# Patient Record
Sex: Female | Born: 1980 | Race: Black or African American | Hispanic: No | Smoking: Never smoker
Health system: Southern US, Community
[De-identification: ages and names within clinical notes are randomized; demographics above are authoritative.]

## PROBLEM LIST (undated history)

## (undated) DIAGNOSIS — Z8042 Family history of malignant neoplasm of prostate: Secondary | ICD-10-CM

## (undated) DIAGNOSIS — M1990 Unspecified osteoarthritis, unspecified site: Secondary | ICD-10-CM

## (undated) DIAGNOSIS — Z800 Family history of malignant neoplasm of digestive organs: Secondary | ICD-10-CM

## (undated) DIAGNOSIS — R002 Palpitations: Secondary | ICD-10-CM

## (undated) DIAGNOSIS — K219 Gastro-esophageal reflux disease without esophagitis: Secondary | ICD-10-CM

## (undated) DIAGNOSIS — M2550 Pain in unspecified joint: Secondary | ICD-10-CM

## (undated) DIAGNOSIS — K5900 Constipation, unspecified: Secondary | ICD-10-CM

## (undated) DIAGNOSIS — N946 Dysmenorrhea, unspecified: Secondary | ICD-10-CM

## (undated) DIAGNOSIS — I10 Essential (primary) hypertension: Secondary | ICD-10-CM

## (undated) DIAGNOSIS — D649 Anemia, unspecified: Secondary | ICD-10-CM

## (undated) DIAGNOSIS — Z91018 Allergy to other foods: Secondary | ICD-10-CM

## (undated) DIAGNOSIS — M549 Dorsalgia, unspecified: Secondary | ICD-10-CM

## (undated) DIAGNOSIS — Z803 Family history of malignant neoplasm of breast: Secondary | ICD-10-CM

## (undated) DIAGNOSIS — R079 Chest pain, unspecified: Secondary | ICD-10-CM

## (undated) DIAGNOSIS — G43009 Migraine without aura, not intractable, without status migrainosus: Secondary | ICD-10-CM

## (undated) HISTORY — DX: Family history of malignant neoplasm of breast: Z80.3

## (undated) HISTORY — DX: Migraine without aura, not intractable, without status migrainosus: G43.009

## (undated) HISTORY — DX: Unspecified osteoarthritis, unspecified site: M19.90

## (undated) HISTORY — DX: Chest pain, unspecified: R07.9

## (undated) HISTORY — DX: Dysmenorrhea, unspecified: N94.6

## (undated) HISTORY — DX: Dorsalgia, unspecified: M54.9

## (undated) HISTORY — DX: Palpitations: R00.2

## (undated) HISTORY — DX: Anemia, unspecified: D64.9

## (undated) HISTORY — DX: Gastro-esophageal reflux disease without esophagitis: K21.9

## (undated) HISTORY — DX: Family history of malignant neoplasm of digestive organs: Z80.0

## (undated) HISTORY — DX: Family history of malignant neoplasm of prostate: Z80.42

## (undated) HISTORY — DX: Pain in unspecified joint: M25.50

## (undated) HISTORY — DX: Constipation, unspecified: K59.00

## (undated) HISTORY — DX: Allergy to other foods: Z91.018

## (undated) HISTORY — DX: Essential (primary) hypertension: I10

---

## 2006-12-06 ENCOUNTER — Emergency Department (HOSPITAL_COMMUNITY): Admission: EM | Admit: 2006-12-06 | Discharge: 2006-12-06 | Payer: Self-pay | Admitting: Emergency Medicine

## 2007-11-07 ENCOUNTER — Emergency Department (HOSPITAL_COMMUNITY): Admission: EM | Admit: 2007-11-07 | Discharge: 2007-11-07 | Payer: Self-pay | Admitting: Emergency Medicine

## 2009-06-27 ENCOUNTER — Emergency Department (HOSPITAL_BASED_OUTPATIENT_CLINIC_OR_DEPARTMENT_OTHER): Admission: EM | Admit: 2009-06-27 | Discharge: 2009-06-27 | Payer: Self-pay | Admitting: Emergency Medicine

## 2009-07-19 ENCOUNTER — Emergency Department (HOSPITAL_BASED_OUTPATIENT_CLINIC_OR_DEPARTMENT_OTHER): Admission: EM | Admit: 2009-07-19 | Discharge: 2009-07-19 | Payer: Self-pay | Admitting: Emergency Medicine

## 2009-07-19 ENCOUNTER — Ambulatory Visit: Payer: Self-pay | Admitting: Diagnostic Radiology

## 2010-08-29 ENCOUNTER — Emergency Department (HOSPITAL_BASED_OUTPATIENT_CLINIC_OR_DEPARTMENT_OTHER): Admission: EM | Admit: 2010-08-29 | Discharge: 2010-08-29 | Payer: Self-pay | Admitting: Emergency Medicine

## 2011-01-28 LAB — BASIC METABOLIC PANEL
BUN: 9 mg/dL (ref 6–23)
CO2: 26 mEq/L (ref 19–32)
Calcium: 9.6 mg/dL (ref 8.4–10.5)
Chloride: 108 mEq/L (ref 96–112)
Creatinine, Ser: 0.7 mg/dL (ref 0.4–1.2)
GFR calc Af Amer: >60 mL/min (ref >60)
GFR calc non Af Amer: >60 mL/min (ref >60)
Glucose, Bld: 85 mg/dL (ref 70–99)
Potassium: 4 mEq/L (ref 3.5–5.1)
Sodium: 141 mEq/L (ref 135–145)

## 2011-01-28 LAB — POCT CARDIAC MARKERS
CKMB, poc: <1 ng/mL — ABNORMAL LOW (ref 1.0–8.0)
Myoglobin, poc: 50.5 ng/mL (ref 12–200)
Troponin i, poc: <0.05 ng/mL (ref 0.00–0.09)

## 2011-01-28 LAB — CBC
HCT: 35.3 % — ABNORMAL LOW (ref 36.0–46.0)
Hemoglobin: 12.5 g/dL (ref 12.0–15.0)
MCHC: 35.6 g/dL (ref 30.0–36.0)
MCV: 90.2 fL (ref 78.0–100.0)
Platelets: 261 10*3/uL (ref 150–400)
RBC: 3.91 MIL/uL (ref 3.87–5.11)
RDW: 12.4 % (ref 11.5–15.5)
WBC: 4.5 10*3/uL (ref 4.0–10.5)

## 2011-01-28 LAB — DIFFERENTIAL
Basophils Absolute: 0.1 10*3/uL (ref 0.0–0.1)
Basophils Relative: 1 % (ref 0–1)
Eosinophils Absolute: 0.1 10*3/uL (ref 0.0–0.7)
Eosinophils Relative: 2 % (ref 0–5)
Lymphocytes Relative: 36 % (ref 12–46)
Lymphs Abs: 1.6 10*3/uL (ref 0.7–4.0)
Monocytes Absolute: 0.3 10*3/uL (ref 0.1–1.0)
Monocytes Relative: 7 % (ref 3–12)
Neutro Abs: 2.4 10*3/uL (ref 1.7–7.7)
Neutrophils Relative %: 53 % (ref 43–77)

## 2011-01-28 LAB — CK: Total CK: 99 U/L (ref 7–177)

## 2011-01-28 LAB — RAPID STREP SCREEN (MED CTR MEBANE ONLY): Streptococcus, Group A Screen (Direct): NEGATIVE

## 2011-07-14 LAB — CBC
HCT: 34.4 — ABNORMAL LOW
Hemoglobin: 11.9 — ABNORMAL LOW
MCHC: 34.6
MCV: 87.9
Platelets: 242
RBC: 3.91
RDW: 12.7
WBC: 8.8

## 2011-07-14 LAB — DIFFERENTIAL
Basophils Absolute: 0
Basophils Relative: 0
Eosinophils Absolute: 0
Eosinophils Relative: 0
Lymphocytes Relative: 12
Lymphs Abs: 1
Monocytes Absolute: 0.1
Monocytes Relative: 1 — ABNORMAL LOW
Neutro Abs: 7.6
Neutrophils Relative %: 87 — ABNORMAL HIGH

## 2011-07-14 LAB — COMPREHENSIVE METABOLIC PANEL
ALT: 11
AST: 24
Albumin: 3.9
Alkaline Phosphatase: 56
BUN: 9
CO2: 27
Calcium: 9.3
Chloride: 106
Creatinine, Ser: 1.18
GFR calc Af Amer: >60
GFR calc non Af Amer: 56 — ABNORMAL LOW
Glucose, Bld: 105 — ABNORMAL HIGH
Potassium: 4
Sodium: 136
Total Bilirubin: 0.9
Total Protein: 6.8

## 2011-07-14 LAB — URINE MICROSCOPIC-ADD ON

## 2011-07-14 LAB — URINALYSIS, ROUTINE W REFLEX MICROSCOPIC
Bilirubin Urine: NEGATIVE
Glucose, UA: NEGATIVE
Ketones, ur: NEGATIVE
Leukocytes, UA: NEGATIVE
Nitrite: NEGATIVE
Protein, ur: 30 — AB
Specific Gravity, Urine: 1.034 — ABNORMAL HIGH
Urobilinogen, UA: 1
pH: 6

## 2011-07-14 LAB — POCT PREGNANCY, URINE
Operator id: 29727
Preg Test, Ur: POSITIVE

## 2011-07-14 LAB — LIPASE, BLOOD: Lipase: 17

## 2013-02-20 ENCOUNTER — Ambulatory Visit: Payer: Self-pay | Admitting: Family

## 2013-02-27 ENCOUNTER — Ambulatory Visit: Payer: Self-pay | Admitting: Family

## 2013-03-08 ENCOUNTER — Encounter: Payer: Self-pay | Admitting: Family

## 2013-03-08 ENCOUNTER — Other Ambulatory Visit (HOSPITAL_COMMUNITY)
Admission: RE | Admit: 2013-03-08 | Discharge: 2013-03-08 | Disposition: A | Discharge status: Home / Self Care | Payer: BC Managed Care – PPO | Source: Ambulatory Visit | Attending: Family | Admitting: Family

## 2013-03-08 ENCOUNTER — Ambulatory Visit (INDEPENDENT_AMBULATORY_CARE_PROVIDER_SITE_OTHER): Payer: BC Managed Care – PPO | Admitting: Family

## 2013-03-08 VITALS — BP 108/80 | HR 91 | Ht 66.5 in | Wt 166.0 lb

## 2013-03-08 DIAGNOSIS — Z01419 Encounter for gynecological examination (general) (routine) without abnormal findings: Secondary | ICD-10-CM | POA: Insufficient documentation

## 2013-03-08 DIAGNOSIS — Z124 Encounter for screening for malignant neoplasm of cervix: Secondary | ICD-10-CM

## 2013-03-08 DIAGNOSIS — Z0000 Encounter for general adult medical examination without abnormal findings: Secondary | ICD-10-CM

## 2013-03-08 DIAGNOSIS — E785 Hyperlipidemia, unspecified: Secondary | ICD-10-CM

## 2013-03-08 LAB — CBC WITH DIFFERENTIAL/PLATELET
Basophils Absolute: 0 10*3/uL (ref 0.0–0.1)
Basophils Relative: 1 % (ref 0–1)
Eosinophils Absolute: 0.1 10*3/uL (ref 0.0–0.7)
Eosinophils Relative: 2 % (ref 0–5)
HCT: 35.5 % — ABNORMAL LOW (ref 36.0–46.0)
Hemoglobin: 11.7 g/dL — ABNORMAL LOW (ref 12.0–15.0)
Lymphocytes Relative: 45 % (ref 12–46)
Lymphs Abs: 2.3 10*3/uL (ref 0.7–4.0)
MCH: 28.7 pg (ref 26.0–34.0)
MCHC: 33 g/dL (ref 30.0–36.0)
MCV: 87.2 fL (ref 78.0–100.0)
Monocytes Absolute: 0.6 10*3/uL (ref 0.1–1.0)
Monocytes Relative: 12 % (ref 3–12)
Neutro Abs: 2 10*3/uL (ref 1.7–7.7)
Neutrophils Relative %: 40 % — ABNORMAL LOW (ref 43–77)
Platelets: 329 10*3/uL (ref 150–400)
RBC: 4.07 MIL/uL (ref 3.87–5.11)
RDW: 13.7 % (ref 11.5–15.5)
WBC: 5 10*3/uL (ref 4.0–10.5)

## 2013-03-08 LAB — TSH: TSH: 0.843 u[IU]/mL (ref 0.350–4.500)

## 2013-03-08 NOTE — Patient Instructions (Signed)
Breast Self-Awareness  Practicing breast self-awareness may pick up problems early, prevent significant medical complications, and possibly save your life. By practicing breast self-awareness, you can become familiar with how your breasts look and feel and if your breasts are changing. This allows you to notice changes early. It can also offer you some reassurance that your breast health is good. One way to learn what is normal for your breasts and whether your breasts are changing is to do a breast self-exam.  If you find a lump or something that was not present in the past, it is best to contact your caregiver right away. Other findings that should be evaluated by your caregiver include nipple discharge, especially if it is bloody; skin changes or reddening; areas where the skin seems to be pulled in (retracted); or new lumps and bumps. Breast pain is seldom associated with cancer (malignancy), but should also be evaluated by a caregiver.  BREAST SELF-EXAM  The best time to examine your breasts is 5 7 days after your menstrual period is over. During menstruation, the breasts are lumpier, and it may be more difficult to pick up changes. If you do not menstruate, have reached menopause, or had your uterus removed (hysterectomy), you should examine your breasts at regular intervals, such as monthly. If you are breastfeeding, examine your breasts after a feeding or after using a breast pump. Breast implants do not decrease the risk for lumps or tumors, so continue to perform breast self-exams as recommended. Talk to your caregiver about how to determine the difference between the implant and breast tissue. Also, talk about the amount of pressure you should use during the exam. Over time, you will become more familiar with the variations of your breasts and more comfortable with the exam. A breast self-exam requires you to remove all your clothes above the waist.    Look at your breasts and nipples. Stand in front of  a mirror in a room with good lighting. With your hands on your hips, push your hands firmly downward. Look for a difference in shape, contour, and size from one breast to the other (asymmetry). Asymmetry includes puckers, dips, or bumps. Also, look for skin changes, such as reddened or scaly areas on the breasts. Look for nipple changes, such as discharge, dimpling, repositioning, or redness.   Carefully feel your breasts. This is best done either in the shower or tub while using soapy water or when flat on your back. Place the arm (on the side of the breast you are examining) above your head. Use the pads (not the fingertips) of your three middle fingers on your opposite hand to feel your breasts. Start in the underarm area and use  inch (2 cm) overlapping circles to feel your breast. Use 3 different levels of pressure (light, medium, and firm pressure) at each circle before moving to the next circle. The light pressure is needed to feel the tissue closest to the skin. The medium pressure will help to feel breast tissue a little deeper, while the firm pressure is needed to feel the tissue close to the ribs. Continue the overlapping circles, moving downward over the breast until you feel your ribs below your breast. Then, move one finger-width towards the center of the body. Continue to use the  inch (2 cm) overlapping circles to feel your breast as you move slowly up toward the collar bone (clavicle) near the base of the neck. Continue the up and down exam using all 3 pressures   until you reach the middle of the chest. Do this with each breast, carefully feeling for lumps or changes.   Keep a written record with breast changes or normal findings for each breast. By writing this information down, you do not need to depend only on memory for size, tenderness, or location. Write down where you are in your menstrual cycle, if you are still menstruating.   Breast tissue can have some lumps or thick tissue. However,  see your caregiver if you find anything that concerns you.   SEEK MEDICAL CARE IF:   You see a change in shape, contour, or size of your breasts or nipples.    You see skin changes, such as reddened or scaly areas on the breasts or nipples.    You have an unusual discharge from your nipples.    You feel a new lump or unusually thick areas.   Document Released: 10/10/2005 Document Revised: 04/10/2012 Document Reviewed: 01/25/2012  [employer] Patient Information 2013 [employer].

## 2013-03-08 NOTE — Progress Notes (Signed)
  Subjective:    Patient ID: Audrey Stafford, female    DOB: Apr 23, 1981, 32 y.o.   MRN: [id number]  HPI 32 year old new patient to the practice is in to be established and for complete physical exam with Pap smear. Denies any complaints today.   Review of Systems  Constitutional: Negative.   HENT: Negative.   Eyes: Negative.   Respiratory: Negative.   Cardiovascular: Negative.   Gastrointestinal: Negative.   Endocrine: Negative.   Genitourinary: Negative.   Musculoskeletal: Negative.   Skin: Negative.   Allergic/Immunologic: Negative.   Neurological: Negative.   Hematological: Negative.   Psychiatric/Behavioral: Negative.    Past Medical History  Diagnosis Date  . Migraine   . GERD (gastroesophageal reflux disease)     History   Social History  . Marital Status: Single    Spouse Name: N/A    Number of Children: N/A  . Years of Education: N/A   Occupational History  . Not on file.   Social History Main Topics  . Smoking status: Never Smoker   . Smokeless tobacco: Not on file  . Alcohol Use: Yes     Comment: socially  . Drug Use: No  . Sexually Active: Not on file   Other Topics Concern  . Not on file   Social History Narrative  . No narrative on file    Past Surgical History  Procedure Laterality Date  . Cesarean section  01/11/2005    No family history on file.  No Known Allergies  No current outpatient prescriptions on file prior to visit.   No current facility-administered medications on file prior to visit.    BP 108/80  Pulse 91  Ht 5' 6.5" (1.689 m)  Wt 166 lb (75.297 kg)  BMI 26.39 kg/m2  SpO2 98%  LMP 05/15/2014chart    Objective:   Physical Exam  Constitutional: She is oriented to person, place, and time. She appears well-developed and well-nourished.  HENT:  Right Ear: External ear normal.  Left Ear: External ear normal.  Nose: Nose normal.  Mouth/Throat: Oropharynx is clear and moist.  Neck: Normal range of motion. Neck  supple.  Cardiovascular: Normal rate, regular rhythm and normal heart sounds.   Pulmonary/Chest: Effort normal and breath sounds normal.  Abdominal: Soft. Bowel sounds are normal.  Musculoskeletal: Normal range of motion.  Neurological: She is alert and oriented to person, place, and time.  Skin: Skin is warm and dry.  Psychiatric: She has a normal mood and affect.          Assessment & Plan:  Assessment:  1. Complete physical exam  Plan: Pap smear sent. Lab sent to include BMP, CBC, lipids, TSH, LFTs will notify patient pending results. Encouraged healthy diet, exercise, monthly self breast exams, safe sex practices. We'll follow with patient and the results of her labs, in one year, and sooner as needed.

## 2013-03-09 LAB — LIPID PANEL
Cholesterol: 202 mg/dL — ABNORMAL HIGH (ref 0–200)
HDL: 36 mg/dL — ABNORMAL LOW (ref >39)
LDL Cholesterol: 154 mg/dL — ABNORMAL HIGH (ref 0–99)
Total CHOL/HDL Ratio: 5.6 Ratio
Triglycerides: 62 mg/dL (ref <150)
VLDL: 12 mg/dL (ref 0–40)

## 2013-03-09 LAB — COMPREHENSIVE METABOLIC PANEL
ALT: <8 U/L (ref 0–35)
AST: 13 U/L (ref 0–37)
Albumin: 4.2 g/dL (ref 3.5–5.2)
Alkaline Phosphatase: 57 U/L (ref 39–117)
BUN: 10 mg/dL (ref 6–23)
CO2: 27 mEq/L (ref 19–32)
Calcium: 9.7 mg/dL (ref 8.4–10.5)
Chloride: 106 mEq/L (ref 96–112)
Creat: 0.65 mg/dL (ref 0.50–1.10)
Glucose, Bld: 77 mg/dL (ref 70–99)
Potassium: 4.2 mEq/L (ref 3.5–5.3)
Sodium: 138 mEq/L (ref 135–145)
Total Bilirubin: 0.3 mg/dL (ref 0.3–1.2)
Total Protein: 7 g/dL (ref 6.0–8.3)

## 2013-03-11 ENCOUNTER — Encounter: Payer: Self-pay | Admitting: Family

## 2013-03-11 DIAGNOSIS — E785 Hyperlipidemia, unspecified: Secondary | ICD-10-CM | POA: Insufficient documentation

## 2013-04-08 ENCOUNTER — Ambulatory Visit: Payer: BC Managed Care – PPO

## 2013-07-25 ENCOUNTER — Encounter: Payer: Self-pay | Admitting: Family

## 2013-07-26 ENCOUNTER — Other Ambulatory Visit: Payer: Self-pay

## 2013-07-26 MED ORDER — NORGESTIMATE-ETH ESTRADIOL 0.25-35 MG-MCG PO TABS: 1.0000 | ORAL_TABLET | Freq: Every day | ORAL | Status: DC

## 2013-08-29 ENCOUNTER — Other Ambulatory Visit: Payer: Self-pay

## 2014-01-06 ENCOUNTER — Telehealth: Payer: Self-pay | Admitting: Family

## 2014-01-06 NOTE — Telephone Encounter (Signed)
[facility] - [place], [place] - [address] requesting re-fill on norgestimate-ethinyl estradiol (ORTHO-CYCLEN,SPRINTEC,PREVIFEM) 0.25-35 MG-MCG tablet

## 2014-06-27 ENCOUNTER — Telehealth: Payer: Self-pay | Admitting: Family

## 2014-06-27 MED ORDER — NORGESTIMATE-ETH ESTRADIOL 0.25-35 MG-MCG PO TABS: 1.0000 | ORAL_TABLET | Freq: Every day | ORAL | Status: DC

## 2014-06-27 NOTE — Telephone Encounter (Signed)
Pt request refill norgestimate-ethinyl estradiol (ORTHO-CYCLEN,SPRINTEC,PREVIFEM) 0.25-35 MG-MCG tablet [facility] Pt has made cpe for 10/8 but needs 30 days rx to fet her through to appt

## 2014-06-27 NOTE — Telephone Encounter (Signed)
Refilled for one year on 07/26/13.  Called and spoke to [facility] [address] and they had no refills on file.  Will send in a refill by e-scribe to the pharmacy.

## 2014-07-25 ENCOUNTER — Telehealth: Payer: Self-pay | Admitting: Family

## 2014-07-25 MED ORDER — NORGESTIMATE-ETH ESTRADIOL 0.25-35 MG-MCG PO TABS: 1.0000 | ORAL_TABLET | Freq: Every day | ORAL | Status: DC

## 2014-07-25 NOTE — Telephone Encounter (Signed)
Pt has to resc due to pcp sch, and will need a refill to get her through to her new appt time. norgestimate-ethinyl estradiol (ORTHO-CYCLEN,SPRINTEC,PREVIFEM) 0.25-35 MG-MCG tablet [facility] [address]

## 2014-07-31 ENCOUNTER — Encounter: Payer: BC Managed Care – PPO | Admitting: Family

## 2014-08-18 ENCOUNTER — Ambulatory Visit (INDEPENDENT_AMBULATORY_CARE_PROVIDER_SITE_OTHER)
Admission: RE | Admit: 2014-08-18 | Discharge: 2014-08-18 | Disposition: A | Discharge status: Home / Self Care | Payer: 59 | Source: Ambulatory Visit | Attending: Family | Admitting: Family

## 2014-08-18 ENCOUNTER — Encounter: Payer: Self-pay | Admitting: Family

## 2014-08-18 ENCOUNTER — Ambulatory Visit (INDEPENDENT_AMBULATORY_CARE_PROVIDER_SITE_OTHER): Payer: 59 | Admitting: Family

## 2014-08-18 VITALS — BP 120/80 | HR 97 | Ht 66.5 in | Wt 182.4 lb

## 2014-08-18 DIAGNOSIS — Z0000 Encounter for general adult medical examination without abnormal findings: Secondary | ICD-10-CM

## 2014-08-18 DIAGNOSIS — M25561 Pain in right knee: Secondary | ICD-10-CM

## 2014-08-18 LAB — COMPREHENSIVE METABOLIC PANEL
ALT: 10 U/L (ref 0–35)
AST: 18 U/L (ref 0–37)
Albumin: 3.5 g/dL (ref 3.5–5.2)
Alkaline Phosphatase: 60 U/L (ref 39–117)
BUN: 8 mg/dL (ref 6–23)
CO2: 25 mEq/L (ref 19–32)
Calcium: 9.3 mg/dL (ref 8.4–10.5)
Chloride: 105 mEq/L (ref 96–112)
Creatinine, Ser: 0.7 mg/dL (ref 0.4–1.2)
GFR: 129.08 mL/min (ref >60.00)
Glucose, Bld: 78 mg/dL (ref 70–99)
Potassium: 4.1 mEq/L (ref 3.5–5.1)
Sodium: 138 mEq/L (ref 135–145)
Total Bilirubin: 0.4 mg/dL (ref 0.2–1.2)
Total Protein: 7.8 g/dL (ref 6.0–8.3)

## 2014-08-18 LAB — CBC WITH DIFFERENTIAL/PLATELET
Basophils Absolute: 0 10*3/uL (ref 0.0–0.1)
Basophils Relative: 0.4 % (ref 0.0–3.0)
Eosinophils Absolute: 0.1 10*3/uL (ref 0.0–0.7)
Eosinophils Relative: 1.3 % (ref 0.0–5.0)
HCT: 35.5 % — ABNORMAL LOW (ref 36.0–46.0)
Hemoglobin: 11.9 g/dL — ABNORMAL LOW (ref 12.0–15.0)
Lymphocytes Relative: 32.6 % (ref 12.0–46.0)
Lymphs Abs: 1.8 10*3/uL (ref 0.7–4.0)
MCHC: 33.6 g/dL (ref 30.0–36.0)
MCV: 88.2 fl (ref 78.0–100.0)
Monocytes Absolute: 0.6 10*3/uL (ref 0.1–1.0)
Monocytes Relative: 9.9 % (ref 3.0–12.0)
Neutro Abs: 3.2 10*3/uL (ref 1.4–7.7)
Neutrophils Relative %: 55.8 % (ref 43.0–77.0)
Platelets: 314 10*3/uL (ref 150.0–400.0)
RBC: 4.03 Mil/uL (ref 3.87–5.11)
RDW: 13.6 % (ref 11.5–15.5)
WBC: 5.7 10*3/uL (ref 4.0–10.5)

## 2014-08-18 LAB — POCT URINALYSIS DIPSTICK
Bilirubin, UA: NEGATIVE
Blood, UA: NEGATIVE
Glucose, UA: NEGATIVE
Ketones, UA: NEGATIVE
Leukocytes, UA: NEGATIVE
Nitrite, UA: NEGATIVE
Protein, UA: NEGATIVE
Spec Grav, UA: <=1.005
Urobilinogen, UA: 0.2
pH, UA: 6

## 2014-08-18 LAB — LIPID PANEL
Cholesterol: 175 mg/dL (ref 0–200)
HDL: 34.1 mg/dL — ABNORMAL LOW (ref >39.00)
LDL Cholesterol: 127 mg/dL — ABNORMAL HIGH (ref 0–99)
NonHDL: 140.9
Total CHOL/HDL Ratio: 5
Triglycerides: 68 mg/dL (ref 0.0–149.0)
VLDL: 13.6 mg/dL (ref 0.0–40.0)

## 2014-08-18 LAB — TSH: TSH: 1.15 u[IU]/mL (ref 0.35–4.50)

## 2014-08-18 MED ORDER — NORGESTIMATE-ETH ESTRADIOL 0.25-35 MG-MCG PO TABS: 1.0000 | ORAL_TABLET | Freq: Every day | ORAL | Status: DC

## 2014-08-18 NOTE — Progress Notes (Signed)
Pre visit review using our clinic review tool, if applicable. No additional management support is needed unless otherwise documented below in the visit note. 

## 2014-08-18 NOTE — Patient Instructions (Signed)

## 2014-08-18 NOTE — Progress Notes (Signed)
   Subjective:    Patient ID: Audrey Stafford, female    DOB: Oct 17, 1981, 33 y.o.   MRN: [id number]  HPI 33 year old African-American female, nonsmoker is in today for complete physical exam. Denies any concerns. Had a Pap smear done last year that was norma Does not routinely exercise. Does perform monthly self breast examsl. Denies any concerns.   Review of Systems  Constitutional: Negative.   HENT: Negative.   Eyes: Negative.   Respiratory: Negative.   Gastrointestinal: Negative.   Endocrine: Negative.   Genitourinary: Negative.   Musculoskeletal: Negative.   Skin: Negative.   Allergic/Immunologic: Negative.   Neurological: Negative.   Hematological: Negative.   Psychiatric/Behavioral: Negative.    Past Medical History  Diagnosis Date  . Migraine   . GERD (gastroesophageal reflux disease)     History   Social History  . Marital Status: Single    Spouse Name: N/A    Number of Children: N/A  . Years of Education: N/A   Occupational History  . Not on file.   Social History Main Topics  . Smoking status: Never Smoker   . Smokeless tobacco: Not on file  . Alcohol Use: Yes     Comment: socially  . Drug Use: No  . Sexual Activity: Not on file   Other Topics Concern  . Not on file   Social History Narrative  . No narrative on file    Past Surgical History  Procedure Laterality Date  . Cesarean section  01/11/2005    Family History  Problem Relation Age of Onset  . Sudden death Mother     No Known Allergies  No current outpatient prescriptions on file prior to visit.   No current facility-administered medications on file prior to visit.    BP 120/80  Pulse 97  Ht 5' 6.5" (1.689 m)  Wt 182 lb 6.4 oz (82.736 kg)  BMI 29.00 kg/m2  LMP 09/28/2015chart    Objective:   Physical Exam  Constitutional: She is oriented to person, place, and time. She appears well-developed and well-nourished.  HENT:  Right Ear: External ear normal.  Left Ear:  External ear normal.  Nose: Nose normal.  Mouth/Throat: Oropharynx is clear and moist.  Eyes: Conjunctivae and EOM are normal. Pupils are equal, round, and reactive to light.  Neck: Normal range of motion. Neck supple. No thyromegaly present.  Cardiovascular: Normal rate, regular rhythm and normal heart sounds.   Pulmonary/Chest: Effort normal and breath sounds normal.  Abdominal: Soft. Bowel sounds are normal.  Musculoskeletal: Normal range of motion.  Neurological: She is alert and oriented to person, place, and time. She has normal reflexes. No cranial nerve deficit. Coordination normal.  Skin: Skin is warm.  Psychiatric: She has a normal mood and affect.          Assessment & Plan:  Marlaine HindChanika was seen today for annual exam.  Diagnoses and associated orders for this visit:  Preventative health care - CBC with Differential - CMP - Lipid Panel - POCT urinalysis dipstick - TSH  Right knee pain - DG Knee Complete 4 Views Right; Future  Other Orders - norgestimate-ethinyl estradiol (ORTHO-CYCLEN,SPRINTEC,PREVIFEM) 0.25-35 MG-MCG tablet; Take 1 tablet by mouth daily.     Encouraged healthy diet, exercise, monthly self breast exams, and weight reduction.

## 2014-08-25 ENCOUNTER — Encounter: Payer: Self-pay | Admitting: Family

## 2015-07-27 ENCOUNTER — Telehealth: Payer: Self-pay | Admitting: Family

## 2015-07-27 MED ORDER — NORGESTIMATE-ETH ESTRADIOL 0.25-35 MG-MCG PO TABS: 1.0000 | ORAL_TABLET | Freq: Every day | ORAL | Status: DC

## 2015-07-27 NOTE — Telephone Encounter (Signed)
Pt has an appt w/cory on 10/28 and needs 1 pk of ortho-cyclen call into [facility] [address]

## 2015-07-27 NOTE — Telephone Encounter (Signed)
Rx sent to pharmacy. Left a message for pt that rx was called in.

## 2015-08-21 ENCOUNTER — Other Ambulatory Visit (HOSPITAL_COMMUNITY)
Admission: RE | Admit: 2015-08-21 | Discharge: 2015-08-21 | Disposition: A | Discharge status: Home / Self Care | Payer: 59 | Source: Ambulatory Visit | Attending: Adult Health | Admitting: Adult Health

## 2015-08-21 ENCOUNTER — Encounter: Payer: Self-pay | Admitting: Adult Health

## 2015-08-21 ENCOUNTER — Ambulatory Visit (INDEPENDENT_AMBULATORY_CARE_PROVIDER_SITE_OTHER): Payer: 59 | Admitting: Adult Health

## 2015-08-21 VITALS — BP 120/84 | Temp 98.6°F | Ht 66.5 in | Wt 189.0 lb

## 2015-08-21 DIAGNOSIS — N760 Acute vaginitis: Secondary | ICD-10-CM | POA: Insufficient documentation

## 2015-08-21 DIAGNOSIS — E669 Obesity, unspecified: Secondary | ICD-10-CM | POA: Diagnosis not present

## 2015-08-21 DIAGNOSIS — Z113 Encounter for screening for infections with a predominantly sexual mode of transmission: Secondary | ICD-10-CM | POA: Insufficient documentation

## 2015-08-21 DIAGNOSIS — Z0000 Encounter for general adult medical examination without abnormal findings: Secondary | ICD-10-CM | POA: Diagnosis not present

## 2015-08-21 LAB — BASIC METABOLIC PANEL
BUN: 8 mg/dL (ref 6–23)
CO2: 28 mEq/L (ref 19–32)
Calcium: 9.9 mg/dL (ref 8.4–10.5)
Chloride: 105 mEq/L (ref 96–112)
Creatinine, Ser: 0.64 mg/dL (ref 0.40–1.20)
GFR: 137.57 mL/min (ref >60.00)
Glucose, Bld: 78 mg/dL (ref 70–99)
Potassium: 4.4 mEq/L (ref 3.5–5.1)
Sodium: 140 mEq/L (ref 135–145)

## 2015-08-21 LAB — HEPATIC FUNCTION PANEL
ALT: 10 U/L (ref 0–35)
AST: 16 U/L (ref 0–37)
Albumin: 4.2 g/dL (ref 3.5–5.2)
Alkaline Phosphatase: 67 U/L (ref 39–117)
Bilirubin, Direct: 0.1 mg/dL (ref 0.0–0.3)
Total Bilirubin: 0.4 mg/dL (ref 0.2–1.2)
Total Protein: 7.5 g/dL (ref 6.0–8.3)

## 2015-08-21 LAB — TSH: TSH: 0.57 u[IU]/mL (ref 0.35–4.50)

## 2015-08-21 LAB — POCT URINALYSIS DIPSTICK
Bilirubin, UA: NEGATIVE
Glucose, UA: NEGATIVE
Ketones, UA: NEGATIVE
Leukocytes, UA: NEGATIVE
Nitrite, UA: NEGATIVE
Protein, UA: NEGATIVE
Spec Grav, UA: >=1.03
Urobilinogen, UA: 0.2
pH, UA: 5.5

## 2015-08-21 LAB — LIPID PANEL
Cholesterol: 204 mg/dL — ABNORMAL HIGH (ref 0–200)
HDL: 40.4 mg/dL (ref >39.00)
LDL Cholesterol: 154 mg/dL — ABNORMAL HIGH (ref 0–99)
NonHDL: 163.47
Total CHOL/HDL Ratio: 5
Triglycerides: 49 mg/dL (ref 0.0–149.0)
VLDL: 9.8 mg/dL (ref 0.0–40.0)

## 2015-08-21 LAB — CBC
HCT: 36.5 % (ref 36.0–46.0)
Hemoglobin: 12.1 g/dL (ref 12.0–15.0)
MCHC: 33.1 g/dL (ref 30.0–36.0)
MCV: 88.7 fl (ref 78.0–100.0)
Platelets: 334 10*3/uL (ref 150.0–400.0)
RBC: 4.12 Mil/uL (ref 3.87–5.11)
RDW: 12.7 % (ref 11.5–15.5)
WBC: 5.2 10*3/uL (ref 4.0–10.5)

## 2015-08-21 LAB — HEMOGLOBIN A1C: Hgb A1c MFr Bld: 5.3 % (ref 4.6–6.5)

## 2015-08-21 LAB — FERRITIN: Ferritin: 58.4 ng/mL (ref 10.0–291.0)

## 2015-08-21 MED ORDER — NORGESTIMATE-ETH ESTRADIOL 0.25-35 MG-MCG PO TABS: 1.0000 | ORAL_TABLET | Freq: Every day | ORAL | Status: DC

## 2015-08-21 MED ORDER — PHENTERMINE HCL 15 MG PO CAPS: 15.0000 mg | ORAL_CAPSULE | ORAL | Status: DC

## 2015-08-21 NOTE — Progress Notes (Signed)
HPI:  Audrey Stafford is here to establish care.  Last PCP and physical: 08/18/2014 with NP Hyman Hopes Immunizations: UTD Diet: She eats healthy Exercise: Runs on treadmill Pap Smear: 2014. - No abnormal   Has the following chronic problems that require follow up and concerns today:  Fatigue  - for one month. She feels like she is always tired. She has been going to bed early and making sure she gets 8 hours of sleep. She has not noticed any improvement since doing this. Does have a history of anemia and feels like this could be the cause.   Obesity - She feels as though she eats healthy and goes to the gym on a regular basis. She walks after work and while at work. Despite this she cannot lose weight.  ROS negative for unless reported above: fevers, chills,feeling poorly, unintentional weight loss, hearing or vision loss, chest pain, palpitations, leg claudication, struggling to breath,Not feeling congested in the chest, no orthopenia, no cough,no wheezing, normal appetite, no soft tissue swelling, no hemoptysis, melena, hematochezia, hematuria, falls, loc, si, or thoughts of self harm.    Past Medical History  Diagnosis Date  . Migraine   . GERD (gastroesophageal reflux disease)     Past Surgical History  Procedure Laterality Date  . Cesarean section  01/11/2005    Family History  Problem Relation Age of Onset  . Sudden death Mother     Social History   Social History  . Marital Status: Single    Spouse Name: N/A  . Number of Children: N/A  . Years of Education: N/A   Social History Main Topics  . Smoking status: Never Smoker   . Smokeless tobacco: None  . Alcohol Use: Yes     Comment: socially  . Drug Use: No  . Sexual Activity: Not Asked   Other Topics Concern  . None   Social History Narrative     Current outpatient prescriptions:  .  norgestimate-ethinyl estradiol (ORTHO-CYCLEN,SPRINTEC,PREVIFEM) 0.25-35 MG-MCG tablet, Take 1 tablet by mouth daily.,  Disp: 1 Package, Rfl: 0  EXAM:  Filed Vitals:   08/21/15 1401  BP: 120/84  Temp: 98.6 F (37 C)    Body mass index is 30.05 kg/(m^2).  GENERAL: vitals reviewed and listed above, alert, oriented, appears well hydrated and in no acute distress.Slightly obese  HEENT: atraumatic, conjunttiva clear, no obvious abnormalities on inspection of external nose and ears. TM's visulized  NECK: Neck is soft and supple without masses, no adenopathy or thyromegaly, trachea midline, no JVD. Normal range of motion.   LUNGS: clear to auscultation bilaterally, no wheezes, rales or rhonchi, good air movement  CV: Regular rate and rhythm, normal S1/S2, no audible murmurs, gallops, or rubs. No peripheral edema.   MS: moves all extremities without noticeable abnormality. No edema noted  Abd: soft/nontender/nondistended/normal bowel sounds   GU: Deferred  Skin: warm and dry, no rash   Extremities: No clubbing, cyanosis, or edema. Capillary refill is WNL. Pulses intact bilaterally in upper and lower extremities.   Neuro: CN II-XII intact, sensation and reflexes normal throughout, 5/5 muscle strength in bilateral upper and lower extremities. Normal finger to nose. Normal rapid alternating movements.   PSYCH: pleasant and cooperative, no obvious depression or anxiety  ASSESSMENT AND PLAN:  1. Routine general medical examination at a health care facility - EKG 12-Lead - NSR, rate 81 - Basic metabolic panel - CBC - Hemoglobin A1c - Hepatic function panel - Lipid panel - POCT urinalysis dipstick -  TSH - HIV antibody - HSV(herpes smplx)abs-1+2(IgG+IgM)-bld - RPR - Urine cytology ancillary only - Continue to eat healthy and exercise 2. Obesity Weight: 189 lb (85.73 kg)   - EKG 12-Lead - due to mothers history of sudden cardiac death and starting on phentermine.  - Basic metabolic panel - CBC - Hemoglobin A1c - Hepatic function panel - Lipid panel - phentermine 15 MG capsule; Take 1  capsule (15 mg total) by mouth every morning.  Dispense: 30 capsule; Refill: 0 - Continue to eat healthy and exercise.  - Work on portion control.   -We reviewed the PMH, PSH, FH, SH, Meds and Allergies. -We provided refills for any medications we will prescribe as needed. -We addressed current concerns per orders and patient instructions. -We have asked for records for pertinent exams, studies, vaccines and notes from previous providers. -We have advised patient to follow up per instructions below.   -Patient advised to return or notify a provider immediately if symptoms worsen or persist or new concerns arise.    Shirline Freesory London Tarnowski, AGNP

## 2015-08-21 NOTE — Patient Instructions (Addendum)
It was great meeting you today!  I will follow up with you regarding your blood work.   Continue to exercise and eat healthy. Use the phentermine as an aid to helping you lose weight  Follow up in three months to see how you are doing. If you need anything in the meantime, please let me know   Health Maintenance, Female Adopting a healthy lifestyle and getting preventive care can go a long way to promote health and wellness. Talk with your health care provider about what schedule of regular examinations is right for you. This is a good chance for you to check in with your provider about disease prevention and staying healthy. In between checkups, there are plenty of things you can do on your own. Experts have done a lot of research about which lifestyle changes and preventive measures are most likely to keep you healthy. Ask your health care provider for more information. WEIGHT AND DIET  Eat a healthy diet  Be sure to include plenty of vegetables, fruits, low-fat dairy products, and lean protein.  Do not eat a lot of foods high in solid fats, added sugars, or salt.  Get regular exercise. This is one of the most important things you can do for your health.  Most adults should exercise for at least 150 minutes each week. The exercise should increase your heart rate and make you sweat (moderate-intensity exercise).  Most adults should also do strengthening exercises at least twice a week. This is in addition to the moderate-intensity exercise.  Maintain a healthy weight  Body mass index (BMI) is a measurement that can be used to identify possible weight problems. It estimates body fat based on height and weight. Your health care provider can help determine your BMI and help you achieve or maintain a healthy weight.  For females 74 years of age and older:   A BMI below 18.5 is considered underweight.  A BMI of 18.5 to 24.9 is normal.  A BMI of 25 to 29.9 is considered  overweight.  A BMI of 30 and above is considered obese.  Watch levels of cholesterol and blood lipids  You should start having your blood tested for lipids and cholesterol at 33 years of age, then have this test every 5 years.  You may need to have your cholesterol levels checked more often if:  Your lipid or cholesterol levels are high.  You are older than 33 years of age.  You are at high risk for heart disease.  CANCER SCREENING   Lung Cancer  Lung cancer screening is recommended for adults 39-109 years old who are at high risk for lung cancer because of a history of smoking.  A yearly low-dose CT scan of the lungs is recommended for people who:  Currently smoke.  Have quit within the past 15 years.  Have at least a 30-pack-year history of smoking. A pack year is smoking an average of one pack of cigarettes a day for 1 year.  Yearly screening should continue until it has been 15 years since you quit.  Yearly screening should stop if you develop a health problem that would prevent you from having lung cancer treatment.  Breast Cancer  Practice breast self-awareness. This means understanding how your breasts normally appear and feel.  It also means doing regular breast self-exams. Let your health care provider know about any changes, no matter how small.  If you are in your 20s or 30s, you should have a clinical  breast exam (CBE) by a health care provider every 1-3 years as part of a regular health exam.  If you are 34 or older, have a CBE every year. Also consider having a breast X-ray (mammogram) every year.  If you have a family history of breast cancer, talk to your health care provider about genetic screening.  If you are at high risk for breast cancer, talk to your health care provider about having an MRI and a mammogram every year.  Breast cancer gene (BRCA) assessment is recommended for women who have family members with BRCA-related cancers. BRCA-related  cancers include:  Breast.  Ovarian.  Tubal.  Peritoneal cancers.  Results of the assessment will determine the need for genetic counseling and BRCA1 and BRCA2 testing. Cervical Cancer Your health care provider may recommend that you be screened regularly for cancer of the pelvic organs (ovaries, uterus, and vagina). This screening involves a pelvic examination, including checking for microscopic changes to the surface of your cervix (Pap test). You may be encouraged to have this screening done every 3 years, beginning at age 18.  For women ages 65-65, health care providers may recommend pelvic exams and Pap testing every 3 years, or they may recommend the Pap and pelvic exam, combined with testing for human papilloma virus (HPV), every 5 years. Some types of HPV increase your risk of cervical cancer. Testing for HPV may also be done on women of any age with unclear Pap test results.  Other health care providers may not recommend any screening for nonpregnant women who are considered low risk for pelvic cancer and who do not have symptoms. Ask your health care provider if a screening pelvic exam is right for you.  If you have had past treatment for cervical cancer or a condition that could lead to cancer, you need Pap tests and screening for cancer for at least 20 years after your treatment. If Pap tests have been discontinued, your risk factors (such as having a new sexual partner) need to be reassessed to determine if screening should resume. Some women have medical problems that increase the chance of getting cervical cancer. In these cases, your health care provider may recommend more frequent screening and Pap tests. Colorectal Cancer  This type of cancer can be detected and often prevented.  Routine colorectal cancer screening usually begins at 33 years of age and continues through 33 years of age.  Your health care provider may recommend screening at an earlier age if you have risk  factors for colon cancer.  Your health care provider may also recommend using home test kits to check for hidden blood in the stool.  A small camera at the end of a tube can be used to examine your colon directly (sigmoidoscopy or colonoscopy). This is done to check for the earliest forms of colorectal cancer.  Routine screening usually begins at age 17.  Direct examination of the colon should be repeated every 5-10 years through 33 years of age. However, you may need to be screened more often if early forms of precancerous polyps or small growths are found. Skin Cancer  Check your skin from head to toe regularly.  Tell your health care provider about any new moles or changes in moles, especially if there is a change in a mole's shape or color.  Also tell your health care provider if you have a mole that is larger than the size of a pencil eraser.  Always use sunscreen. Apply sunscreen liberally and  repeatedly throughout the day.  Protect yourself by wearing long sleeves, pants, a wide-brimmed hat, and sunglasses whenever you are outside. HEART DISEASE, DIABETES, AND HIGH BLOOD PRESSURE   High blood pressure causes heart disease and increases the risk of stroke. High blood pressure is more likely to develop in:  People who have blood pressure in the high end of the normal range (130-139/85-89 mm Hg).  People who are overweight or obese.  People who are African American.  If you are 69-70 years of age, have your blood pressure checked every 3-5 years. If you are 16 years of age or older, have your blood pressure checked every year. You should have your blood pressure measured twice--once when you are at a hospital or clinic, and once when you are not at a hospital or clinic. Record the average of the two measurements. To check your blood pressure when you are not at a hospital or clinic, you can use:  An automated blood pressure machine at a pharmacy.  A home blood pressure  monitor.  If you are between 49 years and 69 years old, ask your health care provider if you should take aspirin to prevent strokes.  Have regular diabetes screenings. This involves taking a blood sample to check your fasting blood sugar level.  If you are at a normal weight and have a low risk for diabetes, have this test once every three years after 33 years of age.  If you are overweight and have a high risk for diabetes, consider being tested at a younger age or more often. PREVENTING INFECTION  Hepatitis B  If you have a higher risk for hepatitis B, you should be screened for this virus. You are considered at high risk for hepatitis B if:  You were born in a country where hepatitis B is common. Ask your health care provider which countries are considered high risk.  Your parents were born in a high-risk country, and you have not been immunized against hepatitis B (hepatitis B vaccine).  You have HIV or AIDS.  You use needles to inject street drugs.  You live with someone who has hepatitis B.  You have had sex with someone who has hepatitis B.  You get hemodialysis treatment.  You take certain medicines for conditions, including cancer, organ transplantation, and autoimmune conditions. Hepatitis C  Blood testing is recommended for:  Everyone born from [birth year] through 1964.  Anyone with known risk factors for hepatitis C. Sexually transmitted infections (STIs)  You should be screened for sexually transmitted infections (STIs) including gonorrhea and chlamydia if:  You are sexually active and are younger than 32 years of age.  You are older than 33 years of age and your health care provider tells you that you are at risk for this type of infection.  Your sexual activity has changed since you were last screened and you are at an increased risk for chlamydia or gonorrhea. Ask your health care provider if you are at risk.  If you do not have HIV, but are at risk, it may be  recommended that you take a prescription medicine daily to prevent HIV infection. This is called pre-exposure prophylaxis (PrEP). You are considered at risk if:  You are sexually active and do not regularly use condoms or know the HIV status of your partner(s).  You take drugs by injection.  You are sexually active with a partner who has HIV. Talk with your health care provider about whether you are at  high risk of being infected with HIV. If you choose to begin PrEP, you should first be tested for HIV. You should then be tested every 3 months for as long as you are taking PrEP.  PREGNANCY   If you are premenopausal and you may become pregnant, ask your health care provider about preconception counseling.  If you may become pregnant, take 400 to 800 micrograms (mcg) of folic acid every day.  If you want to prevent pregnancy, talk to your health care provider about birth control (contraception). OSTEOPOROSIS AND MENOPAUSE   Osteoporosis is a disease in which the bones lose minerals and strength with aging. This can result in serious bone fractures. Your risk for osteoporosis can be identified using a bone density scan.  If you are 38 years of age or older, or if you are at risk for osteoporosis and fractures, ask your health care provider if you should be screened.  Ask your health care provider whether you should take a calcium or vitamin D supplement to lower your risk for osteoporosis.  Menopause may have certain physical symptoms and risks.  Hormone replacement therapy may reduce some of these symptoms and risks. Talk to your health care provider about whether hormone replacement therapy is right for you.  HOME CARE INSTRUCTIONS   Schedule regular health, dental, and eye exams.  Stay current with your immunizations.   Do not use any tobacco products including cigarettes, chewing tobacco, or electronic cigarettes.  If you are pregnant, do not drink alcohol.  If you are  breastfeeding, limit how much and how often you drink alcohol.  Limit alcohol intake to no more than 1 drink per day for nonpregnant women. One drink equals 12 ounces of beer, 5 ounces of wine, or 1 ounces of hard liquor.  Do not use street drugs.  Do not share needles.  Ask your health care provider for help if you need support or information about quitting drugs.  Tell your health care provider if you often feel depressed.  Tell your health care provider if you have ever been abused or do not feel safe at home.   This information is not intended to replace advice given to you by your health care provider. Make sure you discuss any questions you have with your health care provider.   Document Released: 04/25/2011 Document Revised: 10/31/2014 Document Reviewed: 09/11/2013 Elsevier Interactive Patient Education [employer].

## 2015-08-21 NOTE — Progress Notes (Signed)
Pre visit review using our clinic review tool, if applicable. No additional management support is needed unless otherwise documented below in the visit note. 

## 2015-08-21 NOTE — Addendum Note (Signed)
Addended by: Nancy FetterNAFZIGER, Rembert Browe L on: 08/21/2015 04:23 PM   Modules accepted: Orders

## 2015-08-22 LAB — HIV ANTIBODY (ROUTINE TESTING W REFLEX): HIV 1&2 Ab, 4th Generation: NONREACTIVE

## 2015-08-22 LAB — RPR

## 2015-08-24 LAB — URINE CYTOLOGY ANCILLARY ONLY
Chlamydia: NEGATIVE
Neisseria Gonorrhea: NEGATIVE
Trichomonas: NEGATIVE

## 2015-08-25 LAB — HSV(HERPES SMPLX)ABS-I+II(IGG+IGM)-BLD
HSV 1 Glycoprotein G Ab, IgG: 7.99 IV — ABNORMAL HIGH
HSV 2 Glycoprotein G Ab, IgG: 0.33 IV
Herpes Simplex Vrs I&II-IgM Ab (EIA): 0.58 INDEX

## 2015-09-20 ENCOUNTER — Encounter: Payer: Self-pay | Admitting: Adult Health

## 2015-09-20 DIAGNOSIS — E669 Obesity, unspecified: Secondary | ICD-10-CM

## 2015-09-21 MED ORDER — PHENTERMINE HCL 15 MG PO CAPS: 15.0000 mg | ORAL_CAPSULE | ORAL | Status: DC

## 2015-11-23 ENCOUNTER — Encounter: Payer: Self-pay | Admitting: Adult Health

## 2015-11-23 ENCOUNTER — Ambulatory Visit (INDEPENDENT_AMBULATORY_CARE_PROVIDER_SITE_OTHER): Payer: 59 | Admitting: Adult Health

## 2015-11-23 VITALS — BP 100/70 | Temp 98.5°F | Ht 66.5 in | Wt 183.1 lb

## 2015-11-23 DIAGNOSIS — E669 Obesity, unspecified: Secondary | ICD-10-CM

## 2015-11-23 MED ORDER — PHENTERMINE HCL 30 MG PO CAPS: 30.0000 mg | ORAL_CAPSULE | ORAL | Status: DC

## 2015-11-23 NOTE — Progress Notes (Signed)
Subjective:    Patient ID: Audrey Stafford, female    DOB: [birth year], 35 y.o.   MRN: [id number]  HPI  35 year old female who presents to the office today for three month follow up regarding weight loss. She took her first two months of phentermine but did not take the third due to not feeling well with sinus issues. She has noticed a difference in her appearance and clothes sizes since starting phentermine. Friends and family have also noticed.   She is eating healthy and exercises multiple times per week.   Review of Systems  Constitutional: Negative.   Respiratory: Negative.   Cardiovascular: Negative.   Musculoskeletal: Negative.   Skin: Negative.   Neurological: Negative.   All other systems reviewed and are negative.  Past Medical History  Diagnosis Date  . Migraine   . GERD (gastroesophageal reflux disease)   . Anemia     Social History   Social History  . Marital Status: Single    Spouse Name: N/A  . Number of Children: N/A  . Years of Education: N/A   Occupational History  . Not on file.   Social History Main Topics  . Smoking status: Never Smoker   . Smokeless tobacco: Not on file  . Alcohol Use: Yes     Comment: socially  . Drug Use: No  . Sexual Activity: Not on file   Other Topics Concern  . Not on file   Social History Narrative   She works at [employer].    Not married    1 child ( boy)    Past Surgical History  Procedure Laterality Date  . Cesarean section  01/11/2005    Family History  Problem Relation Age of Onset  . Sudden Cardiac Death Mother     MI  . Breast cancer Paternal Aunt     3  . Hypertension Maternal Grandmother   . Diabetes Maternal Grandmother   . Hypertension Maternal Aunt     No Known Allergies  Current Outpatient Prescriptions on File Prior to Visit  Medication Sig Dispense Refill  . norgestimate-ethinyl estradiol (ORTHO-CYCLEN,SPRINTEC,PREVIFEM) 0.25-35 MG-MCG tablet Take 1 tablet by mouth daily. 1 Package 11    No current facility-administered medications on file prior to visit.    BP 100/70 mmHg  Temp(Src) 98.5 F (36.9 C) (Oral)  Ht 5' 6.5" (1.689 m)  Wt 183 lb 1.6 oz (83.054 kg)  BMI 29.11 kg/m2  LMP 11/09/2015       Objective:   Physical Exam  Constitutional: She is oriented to person, place, and time. She appears well-developed and well-nourished. No distress.  Cardiovascular: Normal rate, regular rhythm, normal heart sounds and intact distal pulses.  Exam reveals no gallop and no friction rub.   No murmur heard. Pulmonary/Chest: Effort normal and breath sounds normal. No respiratory distress. She has no wheezes. She has no rales. She exhibits no tenderness.  Neurological: She is alert and oriented to person, place, and time.  Skin: Skin is warm and dry. No rash noted. She is not diaphoretic. No erythema. No pallor.  Psychiatric: She has a normal mood and affect. Her behavior is normal. Judgment and thought content normal.  Nursing note and vitals reviewed.     Assessment & Plan:  1. Obesity - phentermine 30 MG capsule; Take 1 capsule (30 mg total) by mouth every morning.  Dispense: 30 capsule; Refill: 0 - Continue to diet and exercise.  - Follow up in 6 months or sooner if  needed  Wt Readings from Last 3 Encounters:  11/23/15 183 lb 1.6 oz (83.054 kg)  08/21/15 189 lb (85.73 kg)  08/18/14 182 lb 6.4 oz (82.736 kg)

## 2015-11-23 NOTE — Patient Instructions (Signed)
It was great seeing you again today!  You are doing a great job... Keep it up.   Follow up with me in 6 months.    To Audrey Stafford's aunt..... She does not have diabetes :)

## 2015-11-23 NOTE — Progress Notes (Signed)
Pre visit review using our clinic review tool, if applicable. No additional management support is needed unless otherwise documented below in the visit note. 

## 2016-06-02 ENCOUNTER — Encounter: Payer: Self-pay | Admitting: Adult Health

## 2016-06-02 ENCOUNTER — Ambulatory Visit (INDEPENDENT_AMBULATORY_CARE_PROVIDER_SITE_OTHER): Payer: 59 | Admitting: Adult Health

## 2016-06-02 VITALS — BP 142/94 | HR 93 | Temp 98.2°F | Ht 66.5 in | Wt 175.4 lb

## 2016-06-02 DIAGNOSIS — T148 Other injury of unspecified body region: Secondary | ICD-10-CM | POA: Diagnosis not present

## 2016-06-02 DIAGNOSIS — T148XXA Other injury of unspecified body region, initial encounter: Secondary | ICD-10-CM

## 2016-06-02 MED ORDER — CYCLOBENZAPRINE HCL 10 MG PO TABS: 10.0000 mg | ORAL_TABLET | Freq: Three times a day (TID) | ORAL | 0 refills | Status: DC | PRN

## 2016-06-02 MED ORDER — METHYLPREDNISOLONE 4 MG PO TBPK: ORAL_TABLET | ORAL | 0 refills | Status: DC

## 2016-06-02 NOTE — Patient Instructions (Addendum)
It was great seeing you!  Your exam is consistent with a muscle strain   I have sent in a muscle relaxer and steroids.   Use your heating pad and take '600mg'$  Motrin every 8 hours for the next 3 days  Let me know if you are not feeling any better  Health Maintenance, Female Adopting a healthy lifestyle and getting preventive care can go a long way to promote health and wellness. Talk with your health care provider about what schedule of regular examinations is right for you. This is a good chance for you to check in with your provider about disease prevention and staying healthy. In between checkups, there are plenty of things you can do on your own. Experts have done a lot of research about which lifestyle changes and preventive measures are most likely to keep you healthy. Ask your health care provider for more information. WEIGHT AND DIET  Eat a healthy diet  Be sure to include plenty of vegetables, fruits, low-fat dairy products, and lean protein.  Do not eat a lot of foods high in solid fats, added sugars, or salt.  Get regular exercise. This is one of the most important things you can do for your health.  Most adults should exercise for at least 150 minutes each week. The exercise should increase your heart rate and make you sweat (moderate-intensity exercise).  Most adults should also do strengthening exercises at least twice a week. This is in addition to the moderate-intensity exercise.  Maintain a healthy weight  Body mass index (BMI) is a measurement that can be used to identify possible weight problems. It estimates body fat based on height and weight. Your health care provider can help determine your BMI and help you achieve or maintain a healthy weight.  For females 44 years of age and older:   A BMI below 18.5 is considered underweight.  A BMI of 18.5 to 24.9 is normal.  A BMI of 25 to 29.9 is considered overweight.  A BMI of 30 and above is considered obese.   Watch levels of cholesterol and blood lipids  You should start having your blood tested for lipids and cholesterol at 34 years of age, then have this test every 5 years.  You may need to have your cholesterol levels checked more often if:  Your lipid or cholesterol levels are high.  You are older than 34 years of age.  You are at high risk for heart disease.  CANCER SCREENING   Lung Cancer  Lung cancer screening is recommended for adults 44-76 years old who are at high risk for lung cancer because of a history of smoking.  A yearly low-dose CT scan of the lungs is recommended for people who:  Currently smoke.  Have quit within the past 15 years.  Have at least a 30-pack-year history of smoking. A pack year is smoking an average of one pack of cigarettes a day for 1 year.  Yearly screening should continue until it has been 15 years since you quit.  Yearly screening should stop if you develop a health problem that would prevent you from having lung cancer treatment.  Breast Cancer  Practice breast self-awareness. This means understanding how your breasts normally appear and feel.  It also means doing regular breast self-exams. Let your health care provider know about any changes, no matter how small.  If you are in your 20s or 30s, you should have a clinical breast exam (CBE) by a health  care provider every 1-3 years as part of a regular health exam.  If you are 34 or older, have a CBE every year. Also consider having a breast X-ray (mammogram) every year.  If you have a family history of breast cancer, talk to your health care provider about genetic screening.  If you are at high risk for breast cancer, talk to your health care provider about having an MRI and a mammogram every year.  Breast cancer gene (BRCA) assessment is recommended for women who have family members with BRCA-related cancers. BRCA-related cancers  include:  Breast.  Ovarian.  Tubal.  Peritoneal cancers.  Results of the assessment will determine the need for genetic counseling and BRCA1 and BRCA2 testing. Cervical Cancer Your health care provider may recommend that you be screened regularly for cancer of the pelvic organs (ovaries, uterus, and vagina). This screening involves a pelvic examination, including checking for microscopic changes to the surface of your cervix (Pap test). You may be encouraged to have this screening done every 3 years, beginning at age 21.  For women ages 89-65, health care providers may recommend pelvic exams and Pap testing every 3 years, or they may recommend the Pap and pelvic exam, combined with testing for human papilloma virus (HPV), every 5 years. Some types of HPV increase your risk of cervical cancer. Testing for HPV may also be done on women of any age with unclear Pap test results.  Other health care providers may not recommend any screening for nonpregnant women who are considered low risk for pelvic cancer and who do not have symptoms. Ask your health care provider if a screening pelvic exam is right for you.  If you have had past treatment for cervical cancer or a condition that could lead to cancer, you need Pap tests and screening for cancer for at least 20 years after your treatment. If Pap tests have been discontinued, your risk factors (such as having a new sexual partner) need to be reassessed to determine if screening should resume. Some women have medical problems that increase the chance of getting cervical cancer. In these cases, your health care provider may recommend more frequent screening and Pap tests. Colorectal Cancer  This type of cancer can be detected and often prevented.  Routine colorectal cancer screening usually begins at 34 years of age and continues through 33 years of age.  Your health care provider may recommend screening at an earlier age if you have risk factors for  colon cancer.  Your health care provider may also recommend using home test kits to check for hidden blood in the stool.  A small camera at the end of a tube can be used to examine your colon directly (sigmoidoscopy or colonoscopy). This is done to check for the earliest forms of colorectal cancer.  Routine screening usually begins at age 36.  Direct examination of the colon should be repeated every 5-10 years through 34 years of age. However, you may need to be screened more often if early forms of precancerous polyps or small growths are found. Skin Cancer  Check your skin from head to toe regularly.  Tell your health care provider about any new moles or changes in moles, especially if there is a change in a mole's shape or color.  Also tell your health care provider if you have a mole that is larger than the size of a pencil eraser.  Always use sunscreen. Apply sunscreen liberally and repeatedly throughout the day.  Protect  yourself by wearing long sleeves, pants, a wide-brimmed hat, and sunglasses whenever you are outside. HEART DISEASE, DIABETES, AND HIGH BLOOD PRESSURE   High blood pressure causes heart disease and increases the risk of stroke. High blood pressure is more likely to develop in:  People who have blood pressure in the high end of the normal range (130-139/85-89 mm Hg).  People who are overweight or obese.  People who are African American.  If you are 54-65 years of age, have your blood pressure checked every 3-5 years. If you are 81 years of age or older, have your blood pressure checked every year. You should have your blood pressure measured twice--once when you are at a hospital or clinic, and once when you are not at a hospital or clinic. Record the average of the two measurements. To check your blood pressure when you are not at a hospital or clinic, you can use:  An automated blood pressure machine at a pharmacy.  A home blood pressure monitor.  If you  are between 81 years and 18 years old, ask your health care provider if you should take aspirin to prevent strokes.  Have regular diabetes screenings. This involves taking a blood sample to check your fasting blood sugar level.  If you are at a normal weight and have a low risk for diabetes, have this test once every three years after 34 years of age.  If you are overweight and have a high risk for diabetes, consider being tested at a younger age or more often. PREVENTING INFECTION  Hepatitis B  If you have a higher risk for hepatitis B, you should be screened for this virus. You are considered at high risk for hepatitis B if:  You were born in a country where hepatitis B is common. Ask your health care provider which countries are considered high risk.  Your parents were born in a high-risk country, and you have not been immunized against hepatitis B (hepatitis B vaccine).  You have HIV or AIDS.  You use needles to inject street drugs.  You live with someone who has hepatitis B.  You have had sex with someone who has hepatitis B.  You get hemodialysis treatment.  You take certain medicines for conditions, including cancer, organ transplantation, and autoimmune conditions. Hepatitis C  Blood testing is recommended for:  Everyone born from [birth year] through 1964.  Anyone with known risk factors for hepatitis C. Sexually transmitted infections (STIs)  You should be screened for sexually transmitted infections (STIs) including gonorrhea and chlamydia if:  You are sexually active and are younger than 34 years of age.  You are older than 34 years of age and your health care provider tells you that you are at risk for this type of infection.  Your sexual activity has changed since you were last screened and you are at an increased risk for chlamydia or gonorrhea. Ask your health care provider if you are at risk.  If you do not have HIV, but are at risk, it may be recommended that you  take a prescription medicine daily to prevent HIV infection. This is called pre-exposure prophylaxis (PrEP). You are considered at risk if:  You are sexually active and do not regularly use condoms or know the HIV status of your partner(s).  You take drugs by injection.  You are sexually active with a partner who has HIV. Talk with your health care provider about whether you are at high risk of being infected with  HIV. If you choose to begin PrEP, you should first be tested for HIV. You should then be tested every 3 months for as long as you are taking PrEP.  PREGNANCY   If you are premenopausal and you may become pregnant, ask your health care provider about preconception counseling.  If you may become pregnant, take 400 to 800 micrograms (mcg) of folic acid every day.  If you want to prevent pregnancy, talk to your health care provider about birth control (contraception). OSTEOPOROSIS AND MENOPAUSE   Osteoporosis is a disease in which the bones lose minerals and strength with aging. This can result in serious bone fractures. Your risk for osteoporosis can be identified using a bone density scan.  If you are 53 years of age or older, or if you are at risk for osteoporosis and fractures, ask your health care provider if you should be screened.  Ask your health care provider whether you should take a calcium or vitamin D supplement to lower your risk for osteoporosis.  Menopause may have certain physical symptoms and risks.  Hormone replacement therapy may reduce some of these symptoms and risks. Talk to your health care provider about whether hormone replacement therapy is right for you.  HOME CARE INSTRUCTIONS   Schedule regular health, dental, and eye exams.  Stay current with your immunizations.   Do not use any tobacco products including cigarettes, chewing tobacco, or electronic cigarettes.  If you are pregnant, do not drink alcohol.  If you are breastfeeding, limit how  much and how often you drink alcohol.  Limit alcohol intake to no more than 1 drink per day for nonpregnant women. One drink equals 12 ounces of beer, 5 ounces of wine, or 1 ounces of hard liquor.  Do not use street drugs.  Do not share needles.  Ask your health care provider for help if you need support or information about quitting drugs.  Tell your health care provider if you often feel depressed.  Tell your health care provider if you have ever been abused or do not feel safe at home.   This information is not intended to replace advice given to you by your health care provider. Make sure you discuss any questions you have with your health care provider.   Document Released: 04/25/2011 Document Revised: 10/31/2014 Document Reviewed: 09/11/2013 Elsevier Interactive Patient Education [employer].

## 2016-06-02 NOTE — Progress Notes (Signed)
   Subjective:    Patient ID: Audrey Stafford, female    DOB: 11/03/1980, 35 y.o.   MRN: [id number]  Back Pain  This is a new problem. The current episode started in the past 7 days. The pain is present in the lumbar spine. The quality of the pain is described as aching. The pain does not radiate. The pain is at a severity of 8/10. The pain is the same all the time. The symptoms are aggravated by bending, sitting and twisting. Stiffness is present all day. Pertinent negatives include no abdominal pain, bladder incontinence, bowel incontinence, leg pain or numbness. She has tried heat and analgesics for the symptoms. The treatment provided mild relief.      Review of Systems  Respiratory: Negative.   Cardiovascular: Negative.   Gastrointestinal: Negative for abdominal pain and bowel incontinence.  Genitourinary: Negative for bladder incontinence.  Musculoskeletal: Positive for back pain and myalgias. Negative for arthralgias, gait problem, neck pain and neck stiffness.  Skin: Negative.   Neurological: Negative for numbness.       Objective:   Physical Exam  Constitutional: She is oriented to person, place, and time. She appears well-developed and well-nourished. No distress.  Cardiovascular: Normal rate, regular rhythm, normal heart sounds and intact distal pulses.  Exam reveals no gallop and no friction rub.   No murmur heard. Pulmonary/Chest: Breath sounds normal. No respiratory distress. She has no wheezes. She has no rales. She exhibits no tenderness.  Musculoskeletal: Normal range of motion. She exhibits tenderness. She exhibits no edema or deformity.  Tenderness with palpation to right lower back. myofascial trigger point felt  Neurological: She is alert and oriented to person, place, and time.  Skin: Skin is warm and dry. No rash noted. She is not diaphoretic. No erythema. No pallor.  Psychiatric: She has a normal mood and affect. Her behavior is normal. Judgment and thought  content normal.  Nursing note and vitals reviewed.     Assessment & Plan:  1. Muscle strain - cyclobenzaprine (FLEXERIL) 10 MG tablet; Take 1 tablet (10 mg total) by mouth 3 (three) times daily as needed for muscle spasms.  Dispense: 30 tablet; Refill: 0 - methylPREDNISolone (MEDROL DOSEPAK) 4 MG TBPK tablet; Take as directed  Dispense: 21 tablet; Refill: 0 - heating pad - Motrin 600mg  Q8H x 3 days - Follow up if no improvement   Shirline Freesory Lamonte Hartt, NP

## 2016-06-02 NOTE — Progress Notes (Signed)
Pre visit review using our clinic review tool, if applicable. No additional management support is needed unless otherwise documented below in the visit note. 

## 2016-07-22 ENCOUNTER — Encounter: Payer: Self-pay | Admitting: Adult Health

## 2016-07-22 ENCOUNTER — Other Ambulatory Visit: Payer: Self-pay

## 2016-07-22 MED ORDER — NORGESTIMATE-ETH ESTRADIOL 0.25-35 MG-MCG PO TABS: 1.0000 | ORAL_TABLET | Freq: Every day | ORAL | 11 refills | Status: DC

## 2016-10-30 ENCOUNTER — Encounter (HOSPITAL_COMMUNITY): Payer: Self-pay | Admitting: Emergency Medicine

## 2016-10-30 ENCOUNTER — Emergency Department (HOSPITAL_COMMUNITY): Payer: 59

## 2016-10-30 ENCOUNTER — Emergency Department (HOSPITAL_COMMUNITY)
Admission: EM | Admit: 2016-10-30 | Discharge: 2016-10-30 | Disposition: A | Discharge status: Home / Self Care | Payer: 59 | Attending: Emergency Medicine | Admitting: Emergency Medicine

## 2016-10-30 DIAGNOSIS — R079 Chest pain, unspecified: Secondary | ICD-10-CM

## 2016-10-30 DIAGNOSIS — Z79899 Other long term (current) drug therapy: Secondary | ICD-10-CM | POA: Diagnosis not present

## 2016-10-30 LAB — BASIC METABOLIC PANEL
Anion gap: 6 (ref 5–15)
BUN: 9 mg/dL (ref 6–20)
CO2: 23 mmol/L (ref 22–32)
Calcium: 9 mg/dL (ref 8.9–10.3)
Chloride: 108 mmol/L (ref 101–111)
Creatinine, Ser: 0.64 mg/dL (ref 0.44–1.00)
GFR calc Af Amer: >60 mL/min (ref >60)
GFR calc non Af Amer: >60 mL/min (ref >60)
Glucose, Bld: 109 mg/dL — ABNORMAL HIGH (ref 65–99)
Potassium: 3.4 mmol/L — ABNORMAL LOW (ref 3.5–5.1)
Sodium: 137 mmol/L (ref 135–145)

## 2016-10-30 LAB — I-STAT TROPONIN, ED
Troponin i, poc: 0 ng/mL (ref 0.00–0.08)
Troponin i, poc: 0 ng/mL (ref 0.00–0.08)

## 2016-10-30 LAB — CBC
HCT: 35.6 % — ABNORMAL LOW (ref 36.0–46.0)
Hemoglobin: 12.2 g/dL (ref 12.0–15.0)
MCH: 29.8 pg (ref 26.0–34.0)
MCHC: 34.3 g/dL (ref 30.0–36.0)
MCV: 86.8 fL (ref 78.0–100.0)
Platelets: 290 10*3/uL (ref 150–400)
RBC: 4.1 MIL/uL (ref 3.87–5.11)
RDW: 13.2 % (ref 11.5–15.5)
WBC: 7.1 10*3/uL (ref 4.0–10.5)

## 2016-10-30 NOTE — ED Triage Notes (Signed)
Pt reports having chest pain substernally that started tonight. Pt states pain feels like a tightness in chest.

## 2016-10-30 NOTE — ED Provider Notes (Signed)
[facility]-EMERGENCY DEPT Provider Note   CSN: [id number] Arrival date & time: 10/30/16  0346     History   Chief Complaint Chief Complaint  Patient presents with  . Chest Pain    HPI Audrey Stafford is a 36 y.o. female.  HPI  Pt presenting with c/o chest pain.  She states that chest pain started in the middle of the night.  She noticed it when she got up to use the bathroom.  She states the pain was sharp and located in the center of her chest.  She began to feel very anxious about it and felt mildly short of breath.  When pain did not improved she came to the ED for evaluation.  No nausea, no diaphoresis, no radiation of pain.  Pain is not associated with exertion.  Pt has not had any treatment prior to arrival.  Her mother had MI and passed away at age 35.  There are no other associated systemic symptoms, there are no other alleviating or modifying factors.   Past Medical History:  Diagnosis Date  . Anemia   . GERD (gastroesophageal reflux disease)   . Migraine     Patient Active Problem List   Diagnosis Date Noted  . Other and unspecified hyperlipidemia 03/11/2013    Past Surgical History:  Procedure Laterality Date  . CESAREAN SECTION  01/11/2005    OB History    Gravida Para Term Preterm AB Living   1 1           SAB TAB Ectopic Multiple Live Births                   Home Medications    Prior to Admission medications   Medication Sig Start Date End Date Taking? Authorizing Provider  norgestimate-ethinyl estradiol (ORTHO-CYCLEN,SPRINTEC,PREVIFEM) 0.25-35 MG-MCG tablet Take 1 tablet by mouth daily. 07/22/16  Yes Shirline Freesory Nafziger, NP  cyclobenzaprine (FLEXERIL) 10 MG tablet Take 1 tablet (10 mg total) by mouth 3 (three) times daily as needed for muscle spasms. Patient not taking: Reported on 10/30/2016 06/02/16   Shirline Freesory Nafziger, NP  methylPREDNISolone (MEDROL DOSEPAK) 4 MG TBPK tablet Take as directed Patient not taking: Reported on 10/30/2016 06/02/16   Shirline Freesory Nafziger, NP    phentermine 30 MG capsule Take 1 capsule (30 mg total) by mouth every morning. Patient not taking: Reported on 10/30/2016 11/23/15   Shirline Freesory Nafziger, NP    Family History Family History  Problem Relation Age of Onset  . Sudden Cardiac Death Mother     MI  . Breast cancer Paternal Aunt     3  . Hypertension Maternal Grandmother   . Diabetes Maternal Grandmother   . Hypertension Maternal Aunt     Social History Social History  Substance Use Topics  . Smoking status: Never Smoker  . Smokeless tobacco: Never Used  . Alcohol use Yes     Comment: socially     Allergies   Patient has no known allergies.   Review of Systems Review of Systems  ROS reviewed and all otherwise negative except for mentioned in HPI   Physical Exam Updated Vital Signs BP 132/88 (BP Location: Left Arm)   Pulse 85   Temp 98.4 F (36.9 C) (Oral)   Resp 17   Ht 5\' 5"  (1.651 m)   Wt 80.3 kg   LMP 10/13/2016   SpO2 98%   BMI 29.45 kg/m  Vitals reviewed Physical Exam Physical Examination: General appearance - alert, well appearing, and in no  distress Mental status - alert, oriented to person, place, and time Eyes - no conjunctival injection, no scleral icterus Mouth - mucous membranes moist, pharynx normal without lesions Chest - clear to auscultation, no wheezes, rales or rhonchi, symmetric air entry, nontender to palpation Heart - normal rate, regular rhythm, normal S1, S2, no murmurs, rubs, clicks or gallops Abdomen - soft, nontender, nondistended, no masses or organomegaly Neurological - alert, oriented, normal speech Extremities - peripheral pulses normal, no pedal edema, no clubbing or cyanosis Skin - normal coloration and turgor, no rashes  ED Treatments / Results  Labs (all labs ordered are listed, but only abnormal results are displayed) Labs Reviewed  BASIC METABOLIC PANEL - Abnormal; Notable for the following:       Result Value   Potassium 3.4 (*)    Glucose, Bld 109 (*)    All  other components within normal limits  CBC - Abnormal; Notable for the following:    HCT 35.6 (*)    All other components within normal limits  I-STAT TROPOININ, ED  I-STAT TROPOININ, ED    EKG  EKG Interpretation  Date/Time:  Sunday October 30 2016 03:55:13 EST Ventricular Rate:  98 PR Interval:    QRS Duration: 94 QT Interval:  351 QTC Calculation: 449 R Axis:   30 Text Interpretation:  Sinus rhythm Low voltage, precordial leads No significant change since last tracing Confirmed by Karma Ganja  MD, Braeden Kennan [phone]) on 10/30/2016 4:46:27 AM       Radiology No results found.  Procedures Procedures (including critical care time)  Medications Ordered in ED Medications - No data to display   Initial Impression / Assessment and Plan / ED Course  I have reviewed the triage vital signs and the nursing notes.  Pertinent labs & imaging results that were available during my care of the patient were reviewed by me and considered in my medical decision making (see chart for details).  Clinical Course    Pt with onset of central anterior chest pain.  Labs are reassuring including 2 sets of troponin.  Pt has a heart score of 1 due to early family history of ACS/death in mother.  For this reason 2 sets of troponins were checked.  EKG reassuring.  Symptoms may be related to GERD as pt has hx of of this and ate hot dogs last night prior to bed.  Discharged with strict return precautions.  Pt agreeable with plan.  Final Clinical Impressions(s) / ED Diagnoses   Final diagnoses:  Chest pain, unspecified type    New Prescriptions Discharge Medication List as of 10/30/2016  8:01 AM       Jerelyn Scott, MD 11/03/16 2130

## 2016-10-30 NOTE — Discharge Instructions (Signed)
Return to the ED with any concerns including difficulty breathing, leg swelling, fainting, worsening pain, decreased level of alertness/lethargy, or any other alarming symptoms °

## 2016-12-15 ENCOUNTER — Encounter: Payer: Self-pay | Admitting: Adult Health

## 2017-04-20 ENCOUNTER — Encounter (HOSPITAL_COMMUNITY): Payer: Self-pay

## 2017-04-20 ENCOUNTER — Emergency Department (HOSPITAL_COMMUNITY)
Admission: EM | Admit: 2017-04-20 | Discharge: 2017-04-21 | Disposition: A | Discharge status: Home / Self Care | Payer: 59 | Attending: Emergency Medicine | Admitting: Emergency Medicine

## 2017-04-20 DIAGNOSIS — Z79899 Other long term (current) drug therapy: Secondary | ICD-10-CM | POA: Diagnosis not present

## 2017-04-20 DIAGNOSIS — R1310 Dysphagia, unspecified: Secondary | ICD-10-CM | POA: Diagnosis present

## 2017-04-20 DIAGNOSIS — T783XXA Angioneurotic edema, initial encounter: Secondary | ICD-10-CM | POA: Insufficient documentation

## 2017-04-20 MED ORDER — DIPHENHYDRAMINE HCL 50 MG/ML IJ SOLN
50.0000 mg | Freq: Once | INTRAMUSCULAR | Status: AC
  Administered 2017-04-20: 50 mg via INTRAVENOUS
  Filled 2017-04-20: qty 1

## 2017-04-20 MED ORDER — FAMOTIDINE 20 MG PO TABS
20.0000 mg | ORAL_TABLET | Freq: Once | ORAL | Status: AC
  Administered 2017-04-20: 20 mg via ORAL
  Filled 2017-04-20: qty 1

## 2017-04-20 MED ORDER — EPINEPHRINE 0.3 MG/0.3ML IJ SOAJ
0.3000 mg | Freq: Once | INTRAMUSCULAR | Status: AC
  Administered 2017-04-20: 0.3 mg via INTRAMUSCULAR
  Filled 2017-04-20: qty 0.3

## 2017-04-20 MED ORDER — METHYLPREDNISOLONE SODIUM SUCC 125 MG IJ SOLR
125.0000 mg | Freq: Once | INTRAMUSCULAR | Status: AC
  Administered 2017-04-20: 125 mg via INTRAVENOUS
  Filled 2017-04-20: qty 2

## 2017-04-20 NOTE — Discharge Instructions (Signed)
Please read and follow all provided instructions.  Your diagnoses today include:  1. Angioedema, initial encounter     Tests performed today include:  Vital signs. See below for your results today.   Medications prescribed:   Pepcid (famotidine) - antihistamine  You can find this medication over-the-counter.   DO NOT exceed:   20mg  Pepcid every 12 hours   Prednisone - steroid medicine   It is best to take this medication in the morning to prevent sleeping problems. If you are diabetic, monitor your blood sugar closely and stop taking Prednisone if blood sugar is over 300. Take with food to prevent stomach upset.    Benadryl (diphenhydramine) - antihistamine  You can find this medication over-the-counter.   DO NOT exceed:   50mg  Benadryl every 6 hours    Benadryl will make you drowsy. DO NOT drive or perform any activities that require you to be awake and alert if taking this.  Take any prescribed medications only as directed.  Home care instructions:   Follow any educational materials contained in this packet  Follow-up instructions: Please follow-up with your primary care provider in the next 3 days for further evaluation of your symptoms.   Return instructions:   Please return to the Emergency Department if you experience worsening symptoms.   Call 9-1-1 immediately if you have an allergic reaction that involves your lips, mouth, throat or if you have any difficulty breathing. This is a life-threatening emergency.   Please return if you have any other emergent concerns.  Additional Information:  Your vital signs today were: BP (!) 148/98 (BP Location: Left Arm)    Pulse (!) 106    Temp 98.7 F (37.1 C) (Oral)    Resp (!) 23    SpO2 98%  If your blood pressure (BP) was elevated above 135/85 this visit, please have this repeated by your doctor within one month. --------------

## 2017-04-20 NOTE — ED Triage Notes (Signed)
T c/o allergic reaction and angioedema starting 1 hour ago. Pt took PO benadryl at that time without relief. Pt c/o of difficulty swallowing and speaking. Provider made aware.

## 2017-04-20 NOTE — ED Provider Notes (Signed)
Presents with allergic reaction with symptoms isolated to tongue swelling. She initially had difficulty swallowing secondary to swelling but is better now. She has made an appointment with an allergist for 2 weeks from today.   Here she received Epinephrine, benadryl, solumedrol and pepcid. She has been observed over time.   Anticipate d/ch home after observation period ending around 1:00 am.  12:00- patient continues to feel her symptoms are improving and tongue is less swollen. No trouble swallowing. No SOB.  1:00 - continues to feel improved. No worsening of any swelling. She can be discharged home with medications and pending allergist appointment. Return precautions discussed.     Elpidio AnisUpstill, Tarina Volk, PA-C 04/21/17 0117    Tilden Fossaees, Elizabeth, MD 04/21/17 [phone]

## 2017-04-20 NOTE — ED Provider Notes (Signed)
[facility]-EMERGENCY DEPT Provider Note   CSN: [id number] Arrival date & time: 04/20/17  2100     History   Chief Complaint Chief Complaint  Patient presents with  . Allergic Reaction    HPI Audrey Stafford is a 36 y.o. female.  Patient presents with acute onset of tongue swelling and difficulty swallowing just prior to arrival. Patient took Benadryl at onset of symptoms without much improvement. Patient denies rash, vomiting, diarrhea, lightheadedness or passing out. Patient had swelling of her lip approximately one month ago after eating applesauce. Uncertain what caused her reaction tonight. Patient does have an epinephrine pen at home. She drove herself to the emergency department. She states that she has appointment with an allergist upcoming. The onset of this condition was acute. The course is constant. Aggravating factors: none. Alleviating factors: none.        Past Medical History:  Diagnosis Date  . Anemia   . GERD (gastroesophageal reflux disease)   . Migraine     Patient Active Problem List   Diagnosis Date Noted  . Other and unspecified hyperlipidemia 03/11/2013    Past Surgical History:  Procedure Laterality Date  . CESAREAN SECTION  01/11/2005    OB History    Gravida Para Term Preterm AB Living   1 1           SAB TAB Ectopic Multiple Live Births                   Home Medications    Prior to Admission medications   Medication Sig Start Date End Date Taking? Authorizing Provider  cyclobenzaprine (FLEXERIL) 10 MG tablet Take 1 tablet (10 mg total) by mouth 3 (three) times daily as needed for muscle spasms. Patient not taking: Reported on 10/30/2016 06/02/16   Shirline Frees, NP  methylPREDNISolone (MEDROL DOSEPAK) 4 MG TBPK tablet Take as directed Patient not taking: Reported on 10/30/2016 06/02/16   Shirline Frees, NP  norgestimate-ethinyl estradiol (ORTHO-CYCLEN,SPRINTEC,PREVIFEM) 0.25-35 MG-MCG tablet Take 1 tablet by mouth daily. 07/22/16   Nafziger,  Kandee Keen, NP  phentermine 30 MG capsule Take 1 capsule (30 mg total) by mouth every morning. Patient not taking: Reported on 10/30/2016 11/23/15   Shirline Frees, NP    Family History Family History  Problem Relation Age of Onset  . Sudden Cardiac Death Mother        MI  . Breast cancer Paternal Aunt        3  . Hypertension Maternal Grandmother   . Diabetes Maternal Grandmother   . Hypertension Maternal Aunt     Social History Social History  Substance Use Topics  . Smoking status: Never Smoker  . Smokeless tobacco: Never Used  . Alcohol use Yes     Comment: socially     Allergies   Patient has no known allergies.   Review of Systems Review of Systems  Constitutional: Negative for fever.  HENT: Positive for facial swelling, trouble swallowing and voice change.   Eyes: Negative for redness.  Respiratory: Negative for shortness of breath, wheezing and stridor.   Cardiovascular: Negative for chest pain.  Gastrointestinal: Negative for nausea and vomiting.  Musculoskeletal: Negative for myalgias.  Skin: Negative for rash.  Neurological: Negative for light-headedness.  Psychiatric/Behavioral: Negative for confusion.     Physical Exam Updated Vital Signs BP (!) 179/110 (BP Location: Left Arm)   Pulse 84   Temp 99.7 F (37.6 C) (Oral)   SpO2 99%   Physical Exam  Constitutional: She appears  well-developed and well-nourished.  HENT:  Head: Normocephalic and atraumatic.  Mouth/Throat: No uvula swelling.  Mild to moderate swelling of the tongue. Slight protrusion. Patient is handling her secretions. Visible pharynx does not appear to be edematous.  Eyes: Conjunctivae are normal. Right eye exhibits no discharge. Left eye exhibits no discharge.  Neck: Normal range of motion. Neck supple.  Cardiovascular: Normal rate, regular rhythm and normal heart sounds.   Pulmonary/Chest: Effort normal and breath sounds normal.  Abdominal: Soft. There is no tenderness.  Neurological:  She is alert.  Skin: Skin is warm and dry.  Psychiatric: She has a normal mood and affect.  Nursing note and vitals reviewed.    ED Treatments / Results  Labs (all labs ordered are listed, but only abnormal results are displayed) Labs Reviewed - No data to display  EKG  EKG Interpretation None       Radiology No results found.  Procedures Procedures (including critical care time)  Medications Ordered in ED Medications  EPINEPHrine (EPI-PEN) injection 0.3 mg (0.3 mg Intramuscular Given 04/20/17 2111)  methylPREDNISolone sodium succinate (SOLU-MEDROL) 125 mg/2 mL injection 125 mg (125 mg Intravenous Given 04/20/17 2128)  diphenhydrAMINE (BENADRYL) injection 50 mg (50 mg Intravenous Given 04/20/17 2128)  famotidine (PEPCID) tablet 20 mg (20 mg Oral Given 04/20/17 2127)     Initial Impression / Assessment and Plan / ED Course  I have reviewed the triage vital signs and the nursing notes.  Pertinent labs & imaging results that were available during my care of the patient were reviewed by me and considered in my medical decision making (see chart for details).     9:13 PM Patient seen and examined. Epi ordered and given. Airway patient.    Vital signs reviewed and are as follows: BP (!) 179/110 (BP Location: Left Arm)   Pulse 84   Temp 99.7 F (37.6 C) (Oral)   SpO2 99%   9:57 PM Patient rechecked several times. Tongue swelling is stable.   11:06 PM Patient With subjective and visible improvement. Will continue to monitor for at least 2 more hours.  If improving, patient will need prednisone, Pepcid, Benadryl at home. She has an EpiPen. Encouraged that she keep follow-up with allergist.  Handoff to [place] PA-C at shift change.   Final Clinical Impressions(s) / ED Diagnoses   Final diagnoses:  Angioedema, initial encounter     New Prescriptions New Prescriptions   No medications on file       Desmond DikeGeiple, Matilde Markie, PA-C 04/20/17 2307    Tilden Fossaees, Elizabeth,  MD 04/21/17 1453

## 2017-04-21 MED ORDER — DIPHENHYDRAMINE HCL 25 MG PO CAPS: 25.0000 mg | ORAL_CAPSULE | Freq: Four times a day (QID) | ORAL | 0 refills | Status: DC | PRN

## 2017-04-21 MED ORDER — FAMOTIDINE 20 MG PO TABS: 20.0000 mg | ORAL_TABLET | Freq: Two times a day (BID) | ORAL | 0 refills | Status: DC

## 2017-04-21 MED ORDER — PREDNISONE 10 MG PO TABS: ORAL_TABLET | ORAL | 0 refills | Status: DC

## 2017-06-15 ENCOUNTER — Encounter: Payer: Self-pay | Admitting: Adult Health

## 2017-06-16 MED ORDER — NORGESTIMATE-ETH ESTRADIOL 0.25-35 MG-MCG PO TABS
1.0000 | ORAL_TABLET | Freq: Every day | ORAL | 0 refills | Status: DC
Start: 2017-06-16 — End: 2017-08-22

## 2017-06-28 ENCOUNTER — Encounter: Payer: 59 | Admitting: Adult Health

## 2017-07-03 NOTE — Progress Notes (Addendum)
Subjective:    Patient ID: Audrey Stafford, female    DOB: 1981-04-07, 36 y.o.   MRN: [id number]  HPI  Patient presents for yearly preventative medicine examination. She is a pleasant 36 year old female who  has a past medical history of Anemia; GERD (gastroesophageal reflux disease); and Migraine.  All immunizations and health maintenance protocols were reviewed with the patient and needed orders were placed. She is up to date on her vaccinations   Appropriate screening laboratory values were ordered for the patient including screening of hyperlipidemia, renal function and hepatic function.  Medication reconciliation,  past medical history, social history, problem list and allergies were reviewed in detail with the patient  Goals were established with regard to weight loss, exercise, and  diet in compliance with medications. She has been struggling with weight loss for some time now. She reports that she is not eating a healthy diet, snacking a lot at work and is not exercising.   Wt Readings from Last 3 Encounters:  07/04/17 195 lb 8 oz (88.7 kg)  10/30/16 177 lb (80.3 kg)  06/02/16 175 lb 6.4 oz (79.6 kg)    Review of Systems  Constitutional: Positive for fatigue.  HENT: Negative.   Eyes: Negative.   Respiratory: Negative.   Cardiovascular: Negative.   Gastrointestinal: Negative.   Genitourinary: Negative.   Musculoskeletal: Negative.   Skin: Negative.   Allergic/Immunologic: Negative.   Neurological: Negative.   Hematological: Negative.   Psychiatric/Behavioral: Negative.   All other systems reviewed and are negative.  Past Medical History:  Diagnosis Date  . Anemia   . GERD (gastroesophageal reflux disease)   . Migraine     Social History   Social History  . Marital status: Single    Spouse name: N/A  . Number of children: N/A  . Years of education: N/A   Occupational History  . Not on file.   Social History Main Topics  . Smoking status: Never Smoker    . Smokeless tobacco: Never Used  . Alcohol use Yes     Comment: socially  . Drug use: No  . Sexual activity: Not on file   Other Topics Concern  . Not on file   Social History Narrative   She works at [employer].    Not married    1 child ( boy)    Past Surgical History:  Procedure Laterality Date  . CESAREAN SECTION  01/11/2005    Family History  Problem Relation Age of Onset  . Sudden Cardiac Death Mother        MI  . Breast cancer Paternal Aunt        3  . Hypertension Maternal Grandmother   . Diabetes Maternal Grandmother   . Hypertension Maternal Aunt     No Known Allergies  Current Outpatient Prescriptions on File Prior to Visit  Medication Sig Dispense Refill  . diphenhydrAMINE (BENADRYL) 25 mg capsule Take 1 capsule (25 mg total) by mouth every 6 (six) hours as needed. Take regularly x 3 days then as needed for any symptoms of allergy 30 capsule 0  . norgestimate-ethinyl estradiol (ORTHO-CYCLEN,SPRINTEC,PREVIFEM) 0.25-35 MG-MCG tablet Take 1 tablet by mouth daily. 3 Package 0   No current facility-administered medications on file prior to visit.     BP 130/90 (BP Location: Left Arm, Patient Position: Sitting, Cuff Size: Normal)   Pulse 96   Temp 98.4 F (36.9 C) (Oral)   Ht  (1.676 m)   Wt 195 lb  8 oz (88.7 kg)   SpO2 96%   BMI 31.55 kg/m       Objective:   Physical Exam  Constitutional: She is oriented to person, place, and time. She appears well-developed and well-nourished. No distress.  HENT:  Head: Normocephalic and atraumatic.  Right Ear: External ear normal.  Left Ear: External ear normal.  Nose: Nose normal.  Mouth/Throat: Oropharynx is clear and moist. No oropharyngeal exudate.  Eyes: Pupils are equal, round, and reactive to light. Conjunctivae are normal. Right eye exhibits no discharge. Left eye exhibits no discharge. No scleral icterus.  Neck: Trachea normal and normal range of motion. Neck supple. No JVD present. Carotid bruit is  not present. No tracheal deviation present. No thyroid mass and no thyromegaly present.  Cardiovascular: Normal rate, regular rhythm, normal heart sounds and intact distal pulses.  Exam reveals no gallop and no friction rub.   No murmur heard. Pulmonary/Chest: Effort normal and breath sounds normal. No stridor. No respiratory distress. She has no wheezes. She has no rales. She exhibits no tenderness.  Abdominal: Soft. Bowel sounds are normal. She exhibits no distension and no mass. There is no tenderness. There is no rebound and no guarding.  Genitourinary:  Genitourinary Comments: She is going to go see GYN   Musculoskeletal: Normal range of motion. She exhibits no edema, tenderness or deformity.  Lymphadenopathy:    She has no cervical adenopathy.  Neurological: She is alert and oriented to person, place, and time. She has normal reflexes. She displays normal reflexes. No cranial nerve deficit. She exhibits normal muscle tone. Coordination normal.  Skin: Skin is warm and dry. No rash noted. She is not diaphoretic. No erythema. No pallor.  Psychiatric: She has a normal mood and affect. Her behavior is normal. Judgment and thought content normal.  Nursing note and vitals reviewed.     Assessment & Plan:  1. Routine history and physical examination of adult - Basic metabolic panel - CBC with Differential/Platelet - Hepatic function panel - Lipid panel - TSH  2. Mixed hyperlipidemia  - Basic metabolic panel - CBC with Differential/Platelet - Hepatic function panel - Lipid panel - TSH   Shirline Freesory Jaquan Sadowsky, NP

## 2017-07-04 ENCOUNTER — Ambulatory Visit (INDEPENDENT_AMBULATORY_CARE_PROVIDER_SITE_OTHER): Payer: 59 | Admitting: Adult Health

## 2017-07-04 ENCOUNTER — Encounter: Payer: Self-pay | Admitting: Adult Health

## 2017-07-04 VITALS — BP 130/90 | HR 96 | Temp 98.4°F | Ht 66.0 in | Wt 195.5 lb

## 2017-07-04 DIAGNOSIS — E782 Mixed hyperlipidemia: Secondary | ICD-10-CM

## 2017-07-04 DIAGNOSIS — Z0000 Encounter for general adult medical examination without abnormal findings: Secondary | ICD-10-CM | POA: Diagnosis not present

## 2017-07-04 LAB — LIPID PANEL
Cholesterol: 158 mg/dL (ref 0–200)
HDL: 35.4 mg/dL — ABNORMAL LOW (ref >39.00)
LDL Cholesterol: 109 mg/dL — ABNORMAL HIGH (ref 0–99)
NonHDL: 122.96
Total CHOL/HDL Ratio: 4
Triglycerides: 69 mg/dL (ref 0.0–149.0)
VLDL: 13.8 mg/dL (ref 0.0–40.0)

## 2017-07-04 LAB — TSH: TSH: 2.02 u[IU]/mL (ref 0.35–4.50)

## 2017-07-04 LAB — CBC WITH DIFFERENTIAL/PLATELET
Basophils Absolute: 0.1 10*3/uL (ref 0.0–0.1)
Basophils Relative: 1.2 % (ref 0.0–3.0)
Eosinophils Absolute: 0.1 10*3/uL (ref 0.0–0.7)
Eosinophils Relative: 1.9 % (ref 0.0–5.0)
HCT: 37.7 % (ref 36.0–46.0)
Hemoglobin: 12.5 g/dL (ref 12.0–15.0)
Lymphocytes Relative: 36.8 % (ref 12.0–46.0)
Lymphs Abs: 1.8 10*3/uL (ref 0.7–4.0)
MCHC: 33 g/dL (ref 30.0–36.0)
MCV: 90.4 fl (ref 78.0–100.0)
Monocytes Absolute: 0.4 10*3/uL (ref 0.1–1.0)
Monocytes Relative: 8.7 % (ref 3.0–12.0)
Neutro Abs: 2.6 10*3/uL (ref 1.4–7.7)
Neutrophils Relative %: 51.4 % (ref 43.0–77.0)
Platelets: 285 10*3/uL (ref 150.0–400.0)
RBC: 4.17 Mil/uL (ref 3.87–5.11)
RDW: 13.2 % (ref 11.5–15.5)
WBC: 5 10*3/uL (ref 4.0–10.5)

## 2017-07-04 LAB — BASIC METABOLIC PANEL
BUN: 8 mg/dL (ref 6–23)
CO2: 23 mEq/L (ref 19–32)
Calcium: 9.4 mg/dL (ref 8.4–10.5)
Chloride: 106 mEq/L (ref 96–112)
Creatinine, Ser: 0.68 mg/dL (ref 0.40–1.20)
GFR: 126.84 mL/min (ref >60.00)
Glucose, Bld: 87 mg/dL (ref 70–99)
Potassium: 3.9 mEq/L (ref 3.5–5.1)
Sodium: 138 mEq/L (ref 135–145)

## 2017-07-04 LAB — HEPATIC FUNCTION PANEL
ALT: 8 U/L (ref 0–35)
AST: 14 U/L (ref 0–37)
Albumin: 4.2 g/dL (ref 3.5–5.2)
Alkaline Phosphatase: 58 U/L (ref 39–117)
Bilirubin, Direct: 0.1 mg/dL (ref 0.0–0.3)
Total Bilirubin: 0.4 mg/dL (ref 0.2–1.2)
Total Protein: 7 g/dL (ref 6.0–8.3)

## 2017-07-04 NOTE — Patient Instructions (Signed)
It was great seeing you today   Please work on diet and exercise - join a gym and work on cutting out foods high in fat.   I will follow up with you regarding your blood work

## 2017-07-14 ENCOUNTER — Encounter: Payer: Self-pay | Admitting: Adult Health

## 2017-08-22 ENCOUNTER — Other Ambulatory Visit: Payer: Self-pay | Admitting: Adult Health

## 2017-08-22 ENCOUNTER — Encounter: Payer: Self-pay | Admitting: Adult Health

## 2017-08-22 MED ORDER — NORGESTIMATE-ETH ESTRADIOL 0.25-35 MG-MCG PO TABS: 1.0000 | ORAL_TABLET | Freq: Every day | ORAL | 0 refills | Status: DC

## 2017-09-06 ENCOUNTER — Ambulatory Visit: Payer: 59 | Admitting: Adult Health

## 2017-09-06 ENCOUNTER — Encounter: Payer: Self-pay | Admitting: Adult Health

## 2017-09-06 VITALS — BP 135/90 | Temp 98.3°F | Wt 195.0 lb

## 2017-09-06 DIAGNOSIS — M545 Low back pain, unspecified: Secondary | ICD-10-CM

## 2017-09-06 MED ORDER — TIZANIDINE HCL 4 MG PO CAPS: 4.0000 mg | ORAL_CAPSULE | Freq: Every evening | ORAL | 0 refills | Status: DC | PRN

## 2017-09-06 MED ORDER — METHYLPREDNISOLONE 4 MG PO TBPK: ORAL_TABLET | ORAL | 0 refills | Status: DC

## 2017-09-06 NOTE — Progress Notes (Signed)
Subjective:    Patient ID: Audrey Stafford, female    DOB: 27-Jun-1981, 36 y.o.   MRN: [id number]  HPI  36 year old female who  has a past medical history of Anemia, GERD (gastroesophageal reflux disease), and Migraine. She presents to the office today for low back pain x 1 week. The pain is described as a " aching". Pain is worse with sitting,bending and twisting.   She denies any numbness or tingling in her lower extremities nor does she have any issues with bowel or bladder.   She has used Motrin, heating pad, and sports creams which have not helped relieve the pain.    Review of Systems See HPI   Past Medical History:  Diagnosis Date  . Anemia   . GERD (gastroesophageal reflux disease)   . Migraine     Social History   Socioeconomic History  . Marital status: Single    Spouse name: Not on file  . Number of children: Not on file  . Years of education: Not on file  . Highest education level: Not on file  Social Needs  . Financial resource strain: Not on file  . Food insecurity - worry: Not on file  . Food insecurity - inability: Not on file  . Transportation needs - medical: Not on file  . Transportation needs - non-medical: Not on file  Occupational History  . Not on file  Tobacco Use  . Smoking status: Never Smoker  . Smokeless tobacco: Never Used  Substance and Sexual Activity  . Alcohol use: Yes    Comment: socially  . Drug use: No  . Sexual activity: Not on file  Other Topics Concern  . Not on file  Social History Narrative   She works at [employer].    Not married    1 child ( boy)    Past Surgical History:  Procedure Laterality Date  . CESAREAN SECTION  01/11/2005    Family History  Problem Relation Age of Onset  . Sudden Cardiac Death Mother        MI  . Breast cancer Paternal Aunt        3  . Hypertension Maternal Grandmother   . Diabetes Maternal Grandmother   . Hypertension Maternal Aunt     No Known Allergies  Current Outpatient  Medications on File Prior to Visit  Medication Sig Dispense Refill  . diphenhydrAMINE (BENADRYL) 25 mg capsule Take 1 capsule (25 mg total) by mouth every 6 (six) hours as needed. Take regularly x 3 days then as needed for any symptoms of allergy 30 capsule 0  . norgestimate-ethinyl estradiol (ORTHO-CYCLEN,SPRINTEC,PREVIFEM) 0.25-35 MG-MCG tablet Take 1 tablet by mouth daily. 1 Package 0   No current facility-administered medications on file prior to visit.     BP 135/90 (BP Location: Left Arm)   Temp 98.3 F (36.8 C) (Oral)   Wt 195 lb (88.5 kg)   BMI 31.47 kg/m       Objective:   Physical Exam  Constitutional: She is oriented to person, place, and time. She appears well-developed and well-nourished.  Cardiovascular: Normal rate, regular rhythm, normal heart sounds and intact distal pulses. Exam reveals no gallop and no friction rub.  No murmur heard. Pulmonary/Chest: Effort normal and breath sounds normal. No respiratory distress. She has no wheezes. She has no rales. She exhibits no tenderness.  Abdominal: Normal appearance. There is no CVA tenderness.  Musculoskeletal: She exhibits tenderness (tenderness with palpation along right lower latissimus dorsi ).  She exhibits no edema or deformity.  Neurological: She is alert and oriented to person, place, and time.  Skin: Skin is warm and dry. No rash noted. She is not diaphoretic. No erythema. No pallor.  Psychiatric: She has a normal mood and affect. Her behavior is normal. Judgment and thought content normal.  Nursing note and vitals reviewed.     Assessment & Plan:  1. Acute right-sided low back pain without sciatica - Muscular in origin. Continue to use Motrin and a heating pad. Follow up if not resolved in 4-5 days  - methylPREDNISolone (MEDROL DOSEPAK) 4 MG TBPK tablet; Take as directed  Dispense: 21 tablet; Refill: 0 - tiZANidine (ZANAFLEX) 4 MG capsule; Take 1 capsule (4 mg total) at bedtime as needed by mouth for muscle  spasms.  Dispense: 30 capsule; Refill: 0  Shirline Freesory Kai Calico, NP

## 2017-11-01 ENCOUNTER — Other Ambulatory Visit: Payer: Self-pay

## 2017-11-01 ENCOUNTER — Encounter: Payer: Self-pay | Admitting: Obstetrics and Gynecology

## 2017-11-01 ENCOUNTER — Ambulatory Visit: Payer: Managed Care, Other (non HMO) | Admitting: Obstetrics and Gynecology

## 2017-11-01 VITALS — BP 138/84 | HR 72 | Resp 16 | Ht 65.75 in | Wt 190.0 lb

## 2017-11-01 DIAGNOSIS — Z01419 Encounter for gynecological examination (general) (routine) without abnormal findings: Secondary | ICD-10-CM

## 2017-11-01 DIAGNOSIS — Z124 Encounter for screening for malignant neoplasm of cervix: Secondary | ICD-10-CM

## 2017-11-01 DIAGNOSIS — E049 Nontoxic goiter, unspecified: Secondary | ICD-10-CM | POA: Diagnosis not present

## 2017-11-01 DIAGNOSIS — Z23 Encounter for immunization: Secondary | ICD-10-CM | POA: Diagnosis not present

## 2017-11-01 MED ORDER — NAPROXEN SODIUM 550 MG PO TABS: 550.0000 mg | ORAL_TABLET | Freq: Two times a day (BID) | ORAL | 2 refills | Status: DC

## 2017-11-01 MED ORDER — LEVONORGEST-ETH ESTRAD 91-DAY 0.1-0.02 & 0.01 MG PO TABS: 1.0000 | ORAL_TABLET | Freq: Every day | ORAL | 4 refills | Status: DC

## 2017-11-01 NOTE — Patient Instructions (Signed)
EXERCISE AND DIET:  We recommended that you start or continue a regular exercise program for good health. Regular exercise means any activity that makes your heart beat faster and makes you sweat.  We recommend exercising at least 30 minutes per day at least 3 days a week, preferably 4 or 5.  We also recommend a diet low in fat and sugar.  Inactivity, poor dietary choices and obesity can cause diabetes, heart attack, stroke, and kidney damage, among others.    ALCOHOL AND SMOKING:  Women should limit their alcohol intake to no more than 7 drinks/beers/glasses of wine (combined, not each!) per week. Moderation of alcohol intake to this level decreases your risk of breast cancer and liver damage. And of course, no recreational drugs are part of a healthy lifestyle.  And absolutely no smoking or even second hand smoke. Most people know smoking can cause heart and lung diseases, but did you know it also contributes to weakening of your bones? Aging of your skin?  Yellowing of your teeth and nails?  CALCIUM AND VITAMIN D:  Adequate intake of calcium and Vitamin D are recommended.  The recommendations for exact amounts of these supplements seem to change often, but generally speaking 600 mg of calcium (either carbonate or citrate) and 800 units of Vitamin D per day seems prudent. Certain women may benefit from higher intake of Vitamin D.  If you are among these women, your doctor will have told you during your visit.    PAP SMEARS:  Pap smears, to check for cervical cancer or precancers,  have traditionally been done yearly, although recent scientific advances have shown that most women can have pap smears less often.  However, every woman still should have a physical exam from her gynecologist every year. It will include a breast check, inspection of the vulva and vagina to check for abnormal growths or skin changes, a visual exam of the cervix, and then an exam to evaluate the size and shape of the uterus and  ovaries.  And after 36 years of age, a rectal exam is indicated to check for rectal cancers. We will also provide age appropriate advice regarding health maintenance, like when you should have certain vaccines, screening for sexually transmitted diseases, bone density testing, colonoscopy, mammograms, etc.   MAMMOGRAMS:  All women over 40 years old should have a yearly mammogram. Many facilities now offer a "3D" mammogram, which may cost around $50 extra out of pocket. If possible,  we recommend you accept the option to have the 3D mammogram performed.  It both reduces the number of women who will be called back for extra views which then turn out to be normal, and it is better than the routine mammogram at detecting truly abnormal areas.    COLONOSCOPY:  Colonoscopy to screen for colon cancer is recommended for all women at age 50.  We know, you hate the idea of the prep.  We agree, BUT, having colon cancer and not knowing it is worse!!  Colon cancer so often starts as a polyp that can be seen and removed at colonscopy, which can quite literally save your life!  And if your first colonoscopy is normal and you have no family history of colon cancer, most women don't have to have it again for 10 years.  Once every ten years, you can do something that may end up saving your life, right?  We will be happy to help you get it scheduled when you are ready.    Be sure to check your insurance coverage so you understand how much it will cost.  It may be covered as a preventative service at no cost, but you should check your particular policy.      Breast Self-Awareness Breast self-awareness means being familiar with how your breasts look and feel. It involves checking your breasts regularly and reporting any changes to your health care provider. Practicing breast self-awareness is important. A change in your breasts can be a sign of a serious medical problem. Being familiar with how your breasts look and feel allows  you to find any problems early, when treatment is more likely to be successful. All women should practice breast self-awareness, including women who have had breast implants. How to do a breast self-exam One way to learn what is normal for your breasts and whether your breasts are changing is to do a breast self-exam. To do a breast self-exam: Look for Changes  1. Remove all the clothing above your waist. 2. Stand in front of a mirror in a room with good lighting. 3. Put your hands on your hips. 4. Push your hands firmly downward. 5. Compare your breasts in the mirror. Look for differences between them (asymmetry), such as: ? Differences in shape. ? Differences in size. ? Puckers, dips, and bumps in one breast and not the other. 6. Look at each breast for changes in your skin, such as: ? Redness. ? Scaly areas. 7. Look for changes in your nipples, such as: ? Discharge. ? Bleeding. ? Dimpling. ? Redness. ? A change in position. Feel for Changes  Carefully feel your breasts for lumps and changes. It is best to do this while lying on your back on the floor and again while sitting or standing in the shower or tub with soapy water on your skin. Feel each breast in the following way:  Place the arm on the side of the breast you are examining above your head.  Feel your breast with the other hand.  Start in the nipple area and make  inch (2 cm) overlapping circles to feel your breast. Use the pads of your three middle fingers to do this. Apply light pressure, then medium pressure, then firm pressure. The light pressure will allow you to feel the tissue closest to the skin. The medium pressure will allow you to feel the tissue that is a little deeper. The firm pressure will allow you to feel the tissue close to the ribs.  Continue the overlapping circles, moving downward over the breast until you feel your ribs below your breast.  Move one finger-width toward the center of the body.  Continue to use the  inch (2 cm) overlapping circles to feel your breast as you move slowly up toward your collarbone.  Continue the up and down exam using all three pressures until you reach your armpit.  Write Down What You Find  Write down what is normal for each breast and any changes that you find. Keep a written record with breast changes or normal findings for each breast. By writing this information down, you do not need to depend only on memory for size, tenderness, or location. Write down where you are in your menstrual cycle, if you are still menstruating. If you are having trouble noticing differences in your breasts, do not get discouraged. With time you will become more familiar with the variations in your breasts and more comfortable with the exam. How often should I examine my breasts? Examine   your breasts every month. If you are breastfeeding, the best time to examine your breasts is after a feeding or after using a breast pump. If you menstruate, the best time to examine your breasts is 5-7 days after your period is over. During your period, your breasts are lumpier, and it may be more difficult to notice changes. When should I see my health care provider? See your health care provider if you notice:  A change in shape or size of your breasts or nipples.  A change in the skin of your breast or nipples, such as a reddened or scaly area.  Unusual discharge from your nipples.  A lump or thick area that was not there before.  Pain in your breasts.  Anything that concerns you.  This information is not intended to replace advice given to you by your health care provider. Make sure you discuss any questions you have with your health care provider. Document Released: 10/10/2005 Document Revised: 03/17/2016 Document Reviewed: 08/30/2015 Elsevier Interactive Patient Education  2018 [employer]  

## 2017-11-01 NOTE — Progress Notes (Signed)
37 y.o. G1P1001 SingleAfrican AmericanF here for annual exam.  She is on OCP's. She is saturating a super tampon in 3-4 hours. Her cramps are bad for 1-2 days, then gets better. She takes 400 mg of ibuprofen, helps some. Sexually active, same partner x 18 years, live together.  Period Cycle (Days): 28 Period Duration (Days): 5 days  Period Pattern: Regular Menstrual Flow: Moderate Menstrual Control: Tampon Menstrual Control Change Freq (Hours): changes tampon every 3-4 hours  Dysmenorrhea: (!) Severe Dysmenorrhea Symptoms: Cramping  Patient's last menstrual period was 10/13/2017.          Sexually active: Yes.    The current method of family planning is OCP (estrogen/progesterone).    Exercising: No.  The patient does not participate in regular exercise at present. Smoker:  no  Health Maintenance: Pap:  03-08-13 WNL  History of abnormal Pap:  no TDaP:  unsure Gardasil: unsure    reports that  has never smoked. she has never used smokeless tobacco. She reports that she does not drink alcohol or use drugs. Son is 12. Works for [employer].  Past Medical History:  Diagnosis Date  . Anemia   . Dysmenorrhea   . GERD (gastroesophageal reflux disease)   . Migraine   Migraine without aura  Past Surgical History:  Procedure Laterality Date  . CESAREAN SECTION  01/11/2005    Current Outpatient Medications  Medication Sig Dispense Refill  . cetirizine (ZYRTEC) 10 MG tablet Take 10 mg by mouth daily.    . norgestimate-ethinyl estradiol (ORTHO-CYCLEN,SPRINTEC,PREVIFEM) 0.25-35 MG-MCG tablet Take 1 tablet by mouth daily. 1 Package 0   No current facility-administered medications for this visit.     Family History  Problem Relation Age of Onset  . Sudden Cardiac Death Mother        MI  . Breast cancer Paternal Aunt        3  . Hypertension Maternal Grandmother   . Diabetes Maternal Grandmother   . Hypertension Maternal Aunt   Mom died at 72 2 Paternal Aunts with breast cancer.  Unsure of the ages, ? 31-50. Those are her Dad's only 2 sisters.    Review of Systems  Constitutional: Negative.   HENT: Negative.   Eyes: Negative.   Respiratory: Negative.   Cardiovascular: Negative.   Gastrointestinal: Negative.   Endocrine: Negative.   Genitourinary:       Nipple tenderness   Musculoskeletal: Negative.   Skin: Negative.   Allergic/Immunologic: Negative.   Neurological: Negative.   Psychiatric/Behavioral: Negative.     Exam:   BP 138/84 (BP Location: Right Arm, Patient Position: Sitting, Cuff Size: Normal)   Pulse 72   Resp 16   Ht 5' 5.75" (1.67 m)   Wt 190 lb (86.2 kg)   LMP 10/13/2017   BMI 30.90 kg/m   Weight change: @WEIGHTCHANGE @ Height:   Height: 5' 5.75" (167 cm)  Ht Readings from Last 3 Encounters:  11/01/17 5' 5.75" (1.67 m)  07/04/17 5\' 6"  (1.676 m)  10/30/16 5\' 5"  (1.651 m)    General appearance: alert, cooperative and appears stated age Head: Normocephalic, thyroid larger on the left, suspect nodule Neck: no adenopathy, supple, symmetrical, trachea midline and thyroid normal to inspection and palpation Lungs: clear to auscultation bilaterally Cardiovascular: regular rate and rhythm Breasts: normal appearance, no masses or tenderness Abdomen: soft, non-tender; non distended,  no masses,  no organomegaly Extremities: extremities normal, atraumatic, no cyanosis or edema Skin: Skin color, texture, turgor normal. No rashes or lesions Lymph  nodes: Cervical, supraclavicular, and axillary nodes normal. No abnormal inguinal nodes palpated Neurologic: Grossly normal   Pelvic: External genitalia:  no lesions              Urethra:  normal appearing urethra with no masses, tenderness or lesions              Bartholins and Skenes: normal                 Vagina: normal appearing vagina with normal color and discharge, no lesions              Cervix: no lesions               Bimanual Exam:  Uterus:  uterus retroverted, suspect anterior myoma  (few cm), mobile, not tender, normal sized.               Adnexa: no mass, fullness, tenderness               Rectovaginal: Confirms               Anus:  normal sphincter tone, no lesions  Chaperone was present for exam.  A:  Well Woman with normal exam  Dysmenorrhea, even on OCP's  Suspect she may have a small myoma  Thyroid enlarged on the left, suspect nodule  P:   Pap with hpv  Discussed breast self exam  Discussed calcium and vit D intake  Screening labs  Change to loseasonique  Anaprox for cramps  TDAP  Start the gardasil series  Recent normal TSH, will set up a thyroid ultrasound

## 2017-11-02 ENCOUNTER — Other Ambulatory Visit (HOSPITAL_COMMUNITY)
Admission: RE | Admit: 2017-11-02 | Discharge: 2017-11-02 | Disposition: A | Discharge status: Home / Self Care | Payer: Managed Care, Other (non HMO) | Source: Ambulatory Visit | Attending: Obstetrics and Gynecology | Admitting: Obstetrics and Gynecology

## 2017-11-02 DIAGNOSIS — Z124 Encounter for screening for malignant neoplasm of cervix: Secondary | ICD-10-CM | POA: Diagnosis not present

## 2017-11-02 NOTE — Addendum Note (Signed)
Addended by: Tobi BastosJERTSON, Petro Talent E on: 11/02/2017 09:38 AM   Modules accepted: Orders

## 2017-11-06 LAB — CYTOLOGY - PAP
Diagnosis: NEGATIVE
HPV: NOT DETECTED

## 2017-11-16 LAB — HEPATIC FUNCTION PANEL
ALT: 13 (ref 7–35)
AST: 15 (ref 13–35)
Alkaline Phosphatase: 102 (ref 25–125)
Bilirubin, Total: 0.4

## 2017-11-16 LAB — TSH: TSH: 1.48 (ref 0.41–5.90)

## 2017-11-16 LAB — BASIC METABOLIC PANEL
BUN: 7 (ref 4–21)
Creatinine: 0.7 (ref 0.5–1.1)
Glucose: 90
Potassium: 3.9 (ref 3.4–5.3)
Sodium: 137 (ref 137–147)

## 2017-11-16 LAB — POCT ERYTHROCYTE SEDIMENTATION RATE, NON-AUTOMATED: Sed Rate: 4

## 2017-11-16 LAB — CBC AND DIFFERENTIAL
HCT: 39 (ref 36–46)
Hemoglobin: 13.2 (ref 12.0–16.0)
Neutrophils Absolute: 3029
Platelets: 275 (ref 150–399)
WBC: 6.6

## 2017-11-17 ENCOUNTER — Ambulatory Visit
Admission: RE | Admit: 2017-11-17 | Discharge: 2017-11-17 | Disposition: A | Discharge status: Home / Self Care | Payer: Managed Care, Other (non HMO) | Source: Ambulatory Visit | Attending: Obstetrics and Gynecology | Admitting: Obstetrics and Gynecology

## 2017-11-17 ENCOUNTER — Telehealth: Payer: Self-pay | Admitting: Obstetrics and Gynecology

## 2017-11-17 ENCOUNTER — Encounter: Payer: Self-pay | Admitting: Obstetrics and Gynecology

## 2017-11-17 DIAGNOSIS — E049 Nontoxic goiter, unspecified: Secondary | ICD-10-CM

## 2017-11-17 NOTE — Telephone Encounter (Signed)
Spoke with patient. Patient was due to start her menses 11/10/2017. Took Plan B after unprotected intercourse on 10/24/2017. This was within 72 hours. Has not started LoSeasonique as she has not started her menses. Has taken 4 UPT tests that were all negative. Advised having a menses that is late or abnormal following taking Plan B is a normal side effect. Patient is asking when she can start her birth control and how to move forward. Advised will review with Dr.Jertson and return call.

## 2017-11-17 NOTE — Telephone Encounter (Signed)
-----   Message from Mychart, Generic sent at 11/17/2017 10:14 AM EST -----    Good morning Dr. Oscar LaJertson,     I am a week late for my menstrual cycle and I have taken 4 pregnancy test but they all say negative. I had taken a morning after pill back on January 1st but I am unsure if that cause a delay. I have not started the new birth control pills yet because I am unsure as to what is going on. I have cramps but nothing else. Is there something else I should do or should I continue to wait?     Thank you,   Audrey Stafford

## 2017-11-17 NOTE — Telephone Encounter (Signed)
Check when the patient was last sexually active. If she hasn't been sexually active since 10/24/16 and she has a negative UPT, she could just start her pills. She will likely have more break through bleeding if she does this, but her contraception will be effective within a week. Other option is to wait to start the pill on the first day of her cycle.  If she doesn't have her cycle in the next 2 weeks she should call back.  Use condoms if sexually active.

## 2017-11-17 NOTE — Telephone Encounter (Signed)
Spoke with patient. States she has not been sexually active since before taking Plan B. Patient is agreeable and verbalizes understanding.   Routing to provider for final review. Patient agreeable to disposition. Will close encounter.

## 2017-11-24 ENCOUNTER — Telehealth: Payer: Self-pay

## 2017-11-24 DIAGNOSIS — E041 Nontoxic single thyroid nodule: Secondary | ICD-10-CM

## 2017-11-24 NOTE — Telephone Encounter (Signed)
-----   Message from Romualdo BolkJill Evelyn Jertson, MD sent at 11/21/2017 12:42 PM EST ----- I've informed the patient that she needs a thyroid biopsy. Please set her up to see Endocrinology so the biopsy can be scheduled.

## 2017-11-24 NOTE — Telephone Encounter (Signed)
Referral entered to Dr.Altheimer in Epic. Dr.Altheimer will review results and patient will be contacted to schedule an appointment. Demographics, thyroid ultrasound, most recent OV sent to Dr. Berline LopesAltheimer's office at [phone].  Spoke with patient. Aware of referral and that she will be contacted to schedule appointment with Dr.Altheimer.  Cc: Soundra Pilonosa Davis  Routing to provider for final review. Patient agreeable to disposition. Will close encounter.

## 2017-12-04 NOTE — Telephone Encounter (Signed)
Fax received from Dr.Altheimer's office that states the patient will need to be seen at the main campus for thyroid biopsy. Call to main campus and spoke with SnyderNikki. Nikki placed a note to the academic office to return call to the office for scheduling the patient.

## 2017-12-06 NOTE — Telephone Encounter (Signed)
Spoke with Audrey Stafford with scheduling. Appointment for patient's thyroid biopsy is scheduled for 12/15/2017 at 10 am at [facility]nd floor [place] [id number]. No prep needed. Patient is agreeable to appointment date and time.  Routing to provider for final review. Patient agreeable to disposition. Will close encounter.

## 2018-01-01 ENCOUNTER — Ambulatory Visit (INDEPENDENT_AMBULATORY_CARE_PROVIDER_SITE_OTHER): Payer: Managed Care, Other (non HMO)

## 2018-01-01 VITALS — BP 122/78 | HR 66 | Wt 195.4 lb

## 2018-01-01 DIAGNOSIS — Z23 Encounter for immunization: Secondary | ICD-10-CM

## 2018-01-09 ENCOUNTER — Encounter: Payer: Self-pay | Admitting: Obstetrics and Gynecology

## 2018-01-09 ENCOUNTER — Telehealth: Payer: Self-pay | Admitting: Obstetrics and Gynecology

## 2018-01-09 NOTE — Telephone Encounter (Signed)
Spoke with patient. In second month of Loseasonique, reports spotting for 2 wks with mild, intermittent cramps. No missed or late pills, takes roughly same time daily. Denies any other symptoms.   Advised patient can take 3 months for cycles to adjust to new OCP, continue to take daily and monitor bleeding.  Patient is SA, recommended UPT. If BTB bleeding continues after 3 months, bleeding becomes heavier than normal cycle or new symptoms develop, return call to office. Dr. Oscar LaJertson will review, I will return call with any additional recommendations.   Routing to provider for final review. Patient is agreeable to disposition. Will close encounter.

## 2018-01-09 NOTE — Telephone Encounter (Signed)
-----   Message from Mychart, Generic sent at 01/09/2018 8:11 AM EDT -----    Good morning Dr. Oscar LaJertson,    I started my 3 month pack of birth control pills back in January but now I have been spotting for 2 weeks. I know you said spotting would occur but is a two weeks period and more normal?    Thank you,  UnumProvidentChanika Fiske

## 2018-02-15 ENCOUNTER — Encounter: Payer: Self-pay | Admitting: Adult Health

## 2018-03-22 ENCOUNTER — Encounter: Payer: Self-pay | Admitting: Obstetrics and Gynecology

## 2018-03-23 ENCOUNTER — Telehealth: Payer: Self-pay | Admitting: Obstetrics and Gynecology

## 2018-03-23 NOTE — Telephone Encounter (Signed)
-----   Message from Mychart, Generic sent at 03/22/2018 9:31 PM EDT -----    Good evening Dr. Oscar LaJertson,     I was wondering if I could go back to my old birth control pills orthro tri cyclen lo. The new pills has had me bleeding everyday except for 2 weeks since January. I know before I was told that my body had to adjust to the pills but is it supposed to take this long? The bleeding is not real heavy but it doesn't stop.     Thank you,    Audrey Stafford

## 2018-03-23 NOTE — Telephone Encounter (Signed)
Spoke with patient. Changed from OCP ortho-cyclen to loseasonique in 10/2017. BTB has continued, requesting to change OCP. Describes menses as normal, last 4-5 days, spotting between is light. Some mild menses cramping when bleeding.   Denies pelvic pain, N/V, fever/chills, odor. No missed or late pills, take roughly same time daily.   Patient is SA, reports neg UPT.  Recommended OV for further evaluation. OV scheduled for 03/26/18 at 4pm with Dr. Oscar La.   Routing to provider for final review. Patient is agreeable to disposition. Will close encounter.

## 2018-03-26 ENCOUNTER — Ambulatory Visit (INDEPENDENT_AMBULATORY_CARE_PROVIDER_SITE_OTHER): Payer: Managed Care, Other (non HMO) | Admitting: Obstetrics and Gynecology

## 2018-03-26 ENCOUNTER — Encounter: Payer: Self-pay | Admitting: Obstetrics and Gynecology

## 2018-03-26 ENCOUNTER — Other Ambulatory Visit: Payer: Self-pay

## 2018-03-26 VITALS — BP 144/92 | HR 80 | Resp 16 | Wt 195.0 lb

## 2018-03-26 DIAGNOSIS — Z3009 Encounter for other general counseling and advice on contraception: Secondary | ICD-10-CM

## 2018-03-26 DIAGNOSIS — N946 Dysmenorrhea, unspecified: Secondary | ICD-10-CM

## 2018-03-26 DIAGNOSIS — N921 Excessive and frequent menstruation with irregular cycle: Secondary | ICD-10-CM

## 2018-03-26 DIAGNOSIS — R030 Elevated blood-pressure reading, without diagnosis of hypertension: Secondary | ICD-10-CM

## 2018-03-26 LAB — POCT URINE PREGNANCY: Preg Test, Ur: NEGATIVE

## 2018-03-26 NOTE — Progress Notes (Signed)
GYNECOLOGY  VISIT   HPI: 37 y.o.   Single  African American  female   G1P1001 with Patient's last menstrual period was 03/12/2018.   here c/o irregular menstrual bleeding and dysmenorrhea since starting the new OCP 6 months ago. The patient was on ortho-cyclen and was reporting monthly cycles x 5 days with severe cramps. She was changed to loseasonique in 1/19 and has been having irregular bleeding since then. She has bleed almost constantly other than 2 weeks. The bleeding varies from spotting to light bleeding. She could wear a tampon all day. She is in the second month of the second pack.  On the prior pills she had bad cramps for 2-3 days.  No change in partners.   GYNECOLOGIC HISTORY: Patient's last menstrual period was 03/12/2018. Contraception:OCP Menopausal hormone therapy: none         OB History    Gravida  1   Para  1   Term  1   Preterm      AB      Living  1     SAB      TAB      Ectopic      Multiple      Live Births  1              Patient Active Problem List   Diagnosis Date Noted  . Hyperlipidemia 03/11/2013    Past Medical History:  Diagnosis Date  . Anemia   . Dysmenorrhea   . GERD (gastroesophageal reflux disease)   . Migraine without aura     Past Surgical History:  Procedure Laterality Date  . CESAREAN SECTION  01/11/2005    Current Outpatient Medications  Medication Sig Dispense Refill  . cetirizine (ZYRTEC) 10 MG tablet Take 10 mg by mouth daily.    . Levonorgestrel-Ethinyl Estradiol (LOSEASONIQUE) 0.1-0.02 & 0.01 MG tablet Take 1 tablet by mouth daily. 1 Package 4  . naproxen sodium (ANAPROX DS) 550 MG tablet Take 1 tablet (550 mg total) by mouth 2 (two) times daily with a meal. 30 tablet 2   No current facility-administered medications for this visit.      ALLERGIES: Milk-related compounds  Family History  Problem Relation Age of Onset  . Sudden Cardiac Death Mother        MI  . Breast cancer Paternal Aunt        3   . Hypertension Maternal Grandmother   . Diabetes Maternal Grandmother   . Hypertension Maternal Aunt     Social History   Socioeconomic History  . Marital status: Single    Spouse name: Not on file  . Number of children: Not on file  . Years of education: Not on file  . Highest education level: Not on file  Occupational History  . Not on file  Social Needs  . Financial resource strain: Not on file  . Food insecurity:    Worry: Not on file    Inability: Not on file  . Transportation needs:    Medical: Not on file    Non-medical: Not on file  Tobacco Use  . Smoking status: Never Smoker  . Smokeless tobacco: Never Used  Substance and Sexual Activity  . Alcohol use: No    Frequency: Never  . Drug use: No  . Sexual activity: Yes    Partners: Male    Birth control/protection: Pill  Lifestyle  . Physical activity:    Days per week: Not on file  Minutes per session: Not on file  . Stress: Not on file  Relationships  . Social connections:    Talks on phone: Not on file    Gets together: Not on file    Attends religious service: Not on file    Active member of club or organization: Not on file    Attends meetings of clubs or organizations: Not on file    Relationship status: Not on file  . Intimate partner violence:    Fear of current or ex partner: Not on file    Emotionally abused: Not on file    Physically abused: Not on file    Forced sexual activity: Not on file  Other Topics Concern  . Not on file  Social History Narrative   She works at [employer].    Not married    1 child ( boy)    Review of Systems  Constitutional: Negative.   HENT: Negative.   Eyes: Negative.   Respiratory: Negative.   Cardiovascular: Negative.   Gastrointestinal: Negative.   Genitourinary:       Irregular menstrual bleeding  Dysmenorrhea   Musculoskeletal: Negative.   Skin: Negative.   Neurological: Negative.   Endo/Heme/Allergies: Negative.   Psychiatric/Behavioral:  Negative.     PHYSICAL EXAMINATION:    BP (!) 144/92 (BP Location: Right Arm, Patient Position: Sitting, Cuff Size: Normal)   Pulse 80   Resp 16   Wt 195 lb (88.5 kg)   LMP 03/12/2018   BMI 31.71 kg/m     General appearance: alert, cooperative and appears stated age  ASSESSMENT Breakthrough bleeding on loseasonique Elevated BP, repeat BP 144/92. Patient reports prior episodes of elevated BP    PLAN UPT negative Given her elevated BP, I told her she could come back for another BP check, but if her BP is high I wouldn't recommend she continue OCP's We discussed other options of contraception, she is interested in the Mirena IUD Discussed risks of mirena insertion, information given, will schedule appointment She has anaprox for cramps Will check her BP at her F/U visit, if still high I will send her back to her primary   An After Visit Summary was printed and given to the patient.  ~15 minutes face to face time of which over 50% was spent in counseling.   CC: Shirline Freesory Nafziger, NP

## 2018-04-02 ENCOUNTER — Ambulatory Visit (INDEPENDENT_AMBULATORY_CARE_PROVIDER_SITE_OTHER): Payer: Managed Care, Other (non HMO) | Admitting: Obstetrics and Gynecology

## 2018-04-02 ENCOUNTER — Encounter: Payer: Self-pay | Admitting: Obstetrics and Gynecology

## 2018-04-02 ENCOUNTER — Other Ambulatory Visit: Payer: Self-pay

## 2018-04-02 VITALS — BP 152/98 | HR 88 | Resp 18 | Wt 191.0 lb

## 2018-04-02 DIAGNOSIS — R030 Elevated blood-pressure reading, without diagnosis of hypertension: Secondary | ICD-10-CM

## 2018-04-02 DIAGNOSIS — Z3009 Encounter for other general counseling and advice on contraception: Secondary | ICD-10-CM

## 2018-04-02 DIAGNOSIS — Z113 Encounter for screening for infections with a predominantly sexual mode of transmission: Secondary | ICD-10-CM

## 2018-04-02 DIAGNOSIS — Z01812 Encounter for preprocedural laboratory examination: Secondary | ICD-10-CM

## 2018-04-02 DIAGNOSIS — Z30430 Encounter for insertion of intrauterine contraceptive device: Secondary | ICD-10-CM | POA: Diagnosis not present

## 2018-04-02 LAB — POCT URINE PREGNANCY: Preg Test, Ur: NEGATIVE

## 2018-04-02 NOTE — Progress Notes (Signed)
GYNECOLOGY  VISIT   HPI: 37 y.o.   Single  African American  female   G1P1001 with Patient's last menstrual period was 02/17/2018.   here for Mirena IUD insertion. Patient has had BTB on OCP's and elevated BP on a few occasions.     GYNECOLOGIC HISTORY: Patient's last menstrual period was 02/17/2018. Contraception:OCP Menopausal hormone therapy: none         OB History    Gravida  1   Para  1   Term  1   Preterm      AB      Living  1     SAB      TAB      Ectopic      Multiple      Live Births  1              Patient Active Problem List   Diagnosis Date Noted  . Hyperlipidemia 03/11/2013    Past Medical History:  Diagnosis Date  . Anemia   . Dysmenorrhea   . GERD (gastroesophageal reflux disease)   . Migraine without aura     Past Surgical History:  Procedure Laterality Date  . CESAREAN SECTION  01/11/2005    Current Outpatient Medications  Medication Sig Dispense Refill  . cetirizine (ZYRTEC) 10 MG tablet Take 10 mg by mouth daily.    . Levonorgestrel-Ethinyl Estradiol (LOSEASONIQUE) 0.1-0.02 & 0.01 MG tablet Take 1 tablet by mouth daily. 1 Package 4  . naproxen sodium (ANAPROX DS) 550 MG tablet Take 1 tablet (550 mg total) by mouth 2 (two) times daily with a meal. 30 tablet 2   No current facility-administered medications for this visit.      ALLERGIES: Milk-related compounds  Family History  Problem Relation Age of Onset  . Sudden Cardiac Death Mother        MI  . Breast cancer Paternal Aunt        3  . Hypertension Maternal Grandmother   . Diabetes Maternal Grandmother   . Hypertension Maternal Aunt     Social History   Socioeconomic History  . Marital status: Single    Spouse name: Not on file  . Number of children: Not on file  . Years of education: Not on file  . Highest education level: Not on file  Occupational History  . Not on file  Social Needs  . Financial resource strain: Not on file  . Food insecurity:     Worry: Not on file    Inability: Not on file  . Transportation needs:    Medical: Not on file    Non-medical: Not on file  Tobacco Use  . Smoking status: Never Smoker  . Smokeless tobacco: Never Used  Substance and Sexual Activity  . Alcohol use: No    Frequency: Never  . Drug use: No  . Sexual activity: Yes    Partners: Male    Birth control/protection: Pill  Lifestyle  . Physical activity:    Days per week: Not on file    Minutes per session: Not on file  . Stress: Not on file  Relationships  . Social connections:    Talks on phone: Not on file    Gets together: Not on file    Attends religious service: Not on file    Active member of club or organization: Not on file    Attends meetings of clubs or organizations: Not on file    Relationship status: Not on  file  . Intimate partner violence:    Fear of current or ex partner: Not on file    Emotionally abused: Not on file    Physically abused: Not on file    Forced sexual activity: Not on file  Other Topics Concern  . Not on file  Social History Narrative   She works at [employer].    Not married    1 child ( boy)    Review of Systems  Constitutional: Negative.   HENT: Negative.   Eyes: Negative.   Respiratory: Negative.   Cardiovascular: Negative.   Gastrointestinal: Negative.   Genitourinary: Negative.   Musculoskeletal: Negative.   Skin: Negative.   Neurological: Negative.   Endo/Heme/Allergies: Negative.   Psychiatric/Behavioral: Negative.     PHYSICAL EXAMINATION:    BP (!) 152/98 (BP Location: Right Arm, Patient Position: Sitting, Cuff Size: Normal)   Pulse 88   Resp 18   Wt 191 lb (86.6 kg)   LMP 02/17/2018 Comment: ongoing bleeding since 02/17/18  BMI 31.06 kg/m     General appearance: alert, cooperative and appears stated age  Pelvic: External genitalia:  no lesions              Urethra:  normal appearing urethra with no masses, tenderness or lesions              Bartholins and Skenes: normal                  Vagina: normal appearing vagina with normal color and discharge, no lesions              Cervix: no lesions  The risks of the mirena IUD were reviewed with the patient, including infection, abnormal bleeding and uterine perfortion. Consent was signed.  A speculum was placed in the vagina, the cervix was cleansed with betadine. A tenaculum was placed on the cervix. The cervix was dilated to a 5 hagar dilator, needed mini-dilators to get into the retroverted cavity.  the uterus sounded to 8-9 cm.  The mirena IUD was inserted, slightly difficult insertion. The string were cut to 3-4 cm. The tenaculum was removed. Slight oozing from the tenaculum site was stopped with pressure.   The patient tolerated the procedure very well.   Post IUD insertion, ultrasound was performed.  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  IUD in place Uterus: 8.54 x 6.76 x 3.5 Normal adnexa bilaterally Left ovary 2.22 x 1.54 x 3.33 Right ovary not measured (computer issue) 1-Fibroid 3.8 x 3.5 cm 2- fibroid 2.53 x 1.8 x 2.06 cm Fibroids are right next to each other  Chaperone was present for exam.  ASSESSMENT Mirena IUD insertion, slightly difficult inserion    PLAN U/S, IUD in place F/U for an IUD check in one month   An After Visit Summary was printed and given to the patient.

## 2018-04-02 NOTE — Patient Instructions (Signed)

## 2018-04-03 ENCOUNTER — Encounter: Payer: Self-pay | Admitting: Obstetrics and Gynecology

## 2018-04-03 LAB — GC/CHLAMYDIA PROBE AMP
Chlamydia trachomatis, NAA: NEGATIVE
Neisseria gonorrhoeae by PCR: NEGATIVE

## 2018-05-03 ENCOUNTER — Ambulatory Visit: Payer: Managed Care, Other (non HMO)

## 2018-05-07 NOTE — Progress Notes (Signed)
GYNECOLOGY  VISIT IUD--stop bleeding but after sex bleeding started again.----1 week ago.   HPI: 37 y.o.   Single  African American  female   G1P1001 with No LMP recorded.   here for 1 month Mirena IUD follow up.  Overall doing well. She has had light bleeding and spotting most days since insertion, stopped for 5 days. No cramps. No dyspareunia.   GYNECOLOGIC HISTORY: No LMP recorded. Contraception:Mirena IUD inserted 04/02/2018 Menopausal hormone therapy: None        OB History    Gravida  1   Para  1   Term  1   Preterm      AB      Living  1     SAB      TAB      Ectopic      Multiple      Live Births  1              Patient Active Problem List   Diagnosis Date Noted  . Hyperlipidemia 03/11/2013    Past Medical History:  Diagnosis Date  . Anemia   . Dysmenorrhea   . GERD (gastroesophageal reflux disease)   . Migraine without aura     Past Surgical History:  Procedure Laterality Date  . CESAREAN SECTION  01/11/2005    Current Outpatient Medications  Medication Sig Dispense Refill  . cetirizine (ZYRTEC) 10 MG tablet Take 10 mg by mouth daily.    . naproxen sodium (ANAPROX DS) 550 MG tablet Take 1 tablet (550 mg total) by mouth 2 (two) times daily with a meal. 30 tablet 2   No current facility-administered medications for this visit.      ALLERGIES: Milk-related compounds  Family History  Problem Relation Age of Onset  . Sudden Cardiac Death Mother        MI  . Breast cancer Paternal Aunt        3  . Hypertension Maternal Grandmother   . Diabetes Maternal Grandmother   . Hypertension Maternal Aunt     Social History   Socioeconomic History  . Marital status: Single    Spouse name: Not on file  . Number of children: Not on file  . Years of education: Not on file  . Highest education level: Not on file  Occupational History  . Not on file  Social Needs  . Financial resource strain: Not on file  . Food insecurity:    Worry: Not  on file    Inability: Not on file  . Transportation needs:    Medical: Not on file    Non-medical: Not on file  Tobacco Use  . Smoking status: Never Smoker  . Smokeless tobacco: Never Used  Substance and Sexual Activity  . Alcohol use: No    Frequency: Never  . Drug use: No  . Sexual activity: Yes    Partners: Male    Birth control/protection: Pill  Lifestyle  . Physical activity:    Days per week: Not on file    Minutes per session: Not on file  . Stress: Not on file  Relationships  . Social connections:    Talks on phone: Not on file    Gets together: Not on file    Attends religious service: Not on file    Active member of club or organization: Not on file    Attends meetings of clubs or organizations: Not on file    Relationship status: Not on file  .  Intimate partner violence:    Fear of current or ex partner: Not on file    Emotionally abused: Not on file    Physically abused: Not on file    Forced sexual activity: Not on file  Other Topics Concern  . Not on file  Social History Narrative   She works at [employer].    Not married    1 child ( boy)    Review of Systems  All other systems reviewed and are negative.   PHYSICAL EXAMINATION:    BP 118/86 (BP Location: Left Arm, Patient Position: Sitting, Cuff Size: Normal)   Pulse 84   Ht 5\' 6"  (1.676 m)   Wt 196 lb (88.9 kg)   BMI 31.64 kg/m     General appearance: alert, cooperative and appears stated age  Pelvic: External genitalia:  no lesions              Urethra:  normal appearing urethra with no masses, tenderness or lesions              Bartholins and Skenes: normal                 Vagina: normal appearing vagina with normal color and discharge, no lesions              Cervix: no lesions and IUD string not seen              Bimanual Exam:  Uterus:  normal size, contour, position, consistency, mobility, non-tender              Adnexa: no mass, fullness, tenderness                Chaperone was  present for exam.  ASSESSMENT IUD check, IUD strings not seen Elevated BP, improving off of OCP's, still slightly high    PLAN U/S to check IUD location F/U with primary MD for BP  Ultrasound done, images reviewed with the patient. IUD in place   An After Visit Summary was printed and given to the patient.

## 2018-05-08 ENCOUNTER — Ambulatory Visit (INDEPENDENT_AMBULATORY_CARE_PROVIDER_SITE_OTHER): Payer: Managed Care, Other (non HMO)

## 2018-05-08 ENCOUNTER — Ambulatory Visit (INDEPENDENT_AMBULATORY_CARE_PROVIDER_SITE_OTHER): Payer: Managed Care, Other (non HMO) | Admitting: Obstetrics and Gynecology

## 2018-05-08 ENCOUNTER — Encounter: Payer: Self-pay | Admitting: Obstetrics and Gynecology

## 2018-05-08 VITALS — BP 118/86 | HR 84 | Ht 66.0 in | Wt 196.0 lb

## 2018-05-08 DIAGNOSIS — Z23 Encounter for immunization: Secondary | ICD-10-CM

## 2018-05-08 DIAGNOSIS — R030 Elevated blood-pressure reading, without diagnosis of hypertension: Secondary | ICD-10-CM | POA: Diagnosis not present

## 2018-05-08 DIAGNOSIS — T8332XA Displacement of intrauterine contraceptive device, initial encounter: Secondary | ICD-10-CM | POA: Diagnosis not present

## 2018-05-08 NOTE — Addendum Note (Signed)
Addended by: Ginny ForthSPRAGUE, KAITLYN E on: 05/08/2018 04:42 PM   Modules accepted: Orders

## 2018-06-22 NOTE — Progress Notes (Deleted)
GYNECOLOGY  VISIT   HPI: 37 y.o.   Single  African American  female   G1P1001 with No LMP recorded.   here for    3 month recheck   GYNECOLOGIC HISTORY: No LMP recorded. Contraception: IUD  Menopausal hormone therapy: none         OB History    Gravida  1   Para  1   Term  1   Preterm      AB      Living  1     SAB      TAB      Ectopic      Multiple      Live Births  1              Patient Active Problem List   Diagnosis Date Noted  . Hyperlipidemia 03/11/2013    Past Medical History:  Diagnosis Date  . Anemia   . Dysmenorrhea   . GERD (gastroesophageal reflux disease)   . Migraine without aura     Past Surgical History:  Procedure Laterality Date  . CESAREAN SECTION  01/11/2005    Current Outpatient Medications  Medication Sig Dispense Refill  . cetirizine (ZYRTEC) 10 MG tablet Take 10 mg by mouth daily.    . naproxen sodium (ANAPROX DS) 550 MG tablet Take 1 tablet (550 mg total) by mouth 2 (two) times daily with a meal. 30 tablet 2   No current facility-administered medications for this visit.      ALLERGIES: Milk-related compounds  Family History  Problem Relation Age of Onset  . Sudden Cardiac Death Mother        MI  . Breast cancer Paternal Aunt        3  . Hypertension Maternal Grandmother   . Diabetes Maternal Grandmother   . Hypertension Maternal Aunt     Social History   Socioeconomic History  . Marital status: Single    Spouse name: Not on file  . Number of children: Not on file  . Years of education: Not on file  . Highest education level: Not on file  Occupational History  . Not on file  Social Needs  . Financial resource strain: Not on file  . Food insecurity:    Worry: Not on file    Inability: Not on file  . Transportation needs:    Medical: Not on file    Non-medical: Not on file  Tobacco Use  . Smoking status: Never Smoker  . Smokeless tobacco: Never Used  Substance and Sexual Activity  . Alcohol  use: No    Frequency: Never  . Drug use: No  . Sexual activity: Yes    Partners: Male    Birth control/protection: Pill  Lifestyle  . Physical activity:    Days per week: Not on file    Minutes per session: Not on file  . Stress: Not on file  Relationships  . Social connections:    Talks on phone: Not on file    Gets together: Not on file    Attends religious service: Not on file    Active member of club or organization: Not on file    Attends meetings of clubs or organizations: Not on file    Relationship status: Not on file  . Intimate partner violence:    Fear of current or ex partner: Not on file    Emotionally abused: Not on file    Physically abused: Not on file  Forced sexual activity: Not on file  Other Topics Concern  . Not on file  Social History Narrative   She works at [employer].    Not married    1 child ( boy)    Review of Systems  Constitutional: Negative.   HENT: Negative.   Eyes: Negative.   Respiratory: Negative.   Cardiovascular: Negative.   Gastrointestinal: Negative.   Genitourinary: Negative.   Musculoskeletal: Negative.   Skin: Negative.   Neurological: Negative.   Endo/Heme/Allergies: Negative.   Psychiatric/Behavioral: Negative.   All other systems reviewed and are negative.   PHYSICAL EXAMINATION:    There were no vitals taken for this visit.    General appearance: alert, cooperative and appears stated age Neck: no adenopathy, supple, symmetrical, trachea midline and thyroid {CHL AMB PHY EX THYROID NORM [phone]::"normal to inspection and palpation"} Breasts: {Exam; breast:13139::"normal appearance, no masses or tenderness"} Abdomen: soft, non-tender; non distended, no masses,  no organomegaly  Pelvic: External genitalia:  no lesions              Urethra:  normal appearing urethra with no masses, tenderness or lesions              Bartholins and Skenes: normal                 Vagina: normal appearing vagina with normal  color and discharge, no lesions              Cervix: {CHL AMB PHY EX CERVIX NORM [phone]::"no lesions"}              Bimanual Exam:  Uterus:  {CHL AMB PHY EX UTERUS NORM DEFAULT:[phone]::"normal size, contour, position, consistency, mobility, non-tender"}              Adnexa: {CHL AMB PHY EX ADNEXA NO MASS DEFAULT:[phone]::"no mass, fullness, tenderness"}              Rectovaginal: {yes no:[id number]}.  Confirms.              Anus:  normal sphincter tone, no lesions  Chaperone was present for exam.  ASSESSMENT     PLAN    An After Visit Summary was printed and given to the patient.  *** minutes face to face time of which over 50% was spent in counseling.

## 2018-06-26 ENCOUNTER — Encounter: Payer: Self-pay | Admitting: Family Medicine

## 2018-06-26 ENCOUNTER — Telehealth: Payer: Self-pay | Admitting: *Deleted

## 2018-06-26 ENCOUNTER — Ambulatory Visit: Payer: Managed Care, Other (non HMO) | Admitting: Obstetrics and Gynecology

## 2018-06-26 NOTE — Telephone Encounter (Signed)
Spoke with patient. Advised patient appointment scheduled for today was originally a 3 month recheck of OCP. Patient no longer taking OCP, has IUD and already had 1 month appointment. Per Dr. Oscar La, this appointment not needed. Patient states she got the call about the appointment and was confused because Dr. Oscar La said to f/u in 1 year. Patient states no current problems. RN advised would cancel appointment for today.   Encounter closed.

## 2018-08-07 ENCOUNTER — Encounter: Payer: Self-pay | Admitting: Adult Health

## 2018-08-07 ENCOUNTER — Ambulatory Visit: Payer: Managed Care, Other (non HMO) | Admitting: Adult Health

## 2018-08-07 VITALS — BP 130/100 | Temp 98.2°F | Wt 203.0 lb

## 2018-08-07 DIAGNOSIS — R030 Elevated blood-pressure reading, without diagnosis of hypertension: Secondary | ICD-10-CM | POA: Diagnosis not present

## 2018-08-07 DIAGNOSIS — G43811 Other migraine, intractable, with status migrainosus: Secondary | ICD-10-CM

## 2018-08-07 DIAGNOSIS — E668 Other obesity: Secondary | ICD-10-CM

## 2018-08-07 MED ORDER — KETOROLAC TROMETHAMINE 60 MG/2ML IM SOLN: 60.0000 mg | Freq: Once | INTRAMUSCULAR | Status: DC

## 2018-08-07 MED ORDER — KETOROLAC TROMETHAMINE 60 MG/2ML IM SOLN
60.0000 mg | Freq: Once | INTRAMUSCULAR | Status: AC
  Administered 2018-08-07: 60 mg via INTRAMUSCULAR

## 2018-08-07 MED ORDER — SUMATRIPTAN SUCCINATE 25 MG PO TABS: 25.0000 mg | ORAL_TABLET | ORAL | 0 refills | Status: DC | PRN

## 2018-08-07 NOTE — Addendum Note (Signed)
Addended by: Kern Reap B on: 08/07/2018 05:26 PM   Modules accepted: Orders

## 2018-08-07 NOTE — Progress Notes (Signed)
Subjective:    Patient ID: Audrey Stafford, female    DOB: [birth year], 37 y.o.   MRN: [id number]  HPI 37 year old female who  has a past medical history of Anemia, Dysmenorrhea, GERD (gastroesophageal reflux disease), and Migraine without aura.  She presents to the office today for headache. She reports that her headache has been intermittent but daily for the last week. Her headache lasts about 2 hours. She has been using a BC powder which helps resolve her migraine. She has history of migraines and feels as though this consistent with her migraines. Headache is across her forehead.    Endorses photophobia and phonophobia. Denies nausea or vomiting.    She would like to be referred to weight loss clinic. She has been exercising and eating healthy but has been unable to lose weight   Review of Systems   See HPI    Past Medical History:  Diagnosis Date  . Anemia   . Dysmenorrhea   . GERD (gastroesophageal reflux disease)   . Migraine without aura     Social History   Socioeconomic History  . Marital status: Single    Spouse name: Not on file  . Number of children: Not on file  . Years of education: Not on file  . Highest education level: Not on file  Occupational History  . Not on file  Social Needs  . Financial resource strain: Not on file  . Food insecurity:    Worry: Not on file    Inability: Not on file  . Transportation needs:    Medical: Not on file    Non-medical: Not on file  Tobacco Use  . Smoking status: Never Smoker  . Smokeless tobacco: Never Used  Substance and Sexual Activity  . Alcohol use: No    Frequency: Never  . Drug use: No  . Sexual activity: Yes    Partners: Male    Birth control/protection: Pill  Lifestyle  . Physical activity:    Days per week: Not on file    Minutes per session: Not on file  . Stress: Not on file  Relationships  . Social connections:    Talks on phone: Not on file    Gets together: Not on file    Attends  religious service: Not on file    Active member of club or organization: Not on file    Attends meetings of clubs or organizations: Not on file    Relationship status: Not on file  . Intimate partner violence:    Fear of current or ex partner: Not on file    Emotionally abused: Not on file    Physically abused: Not on file    Forced sexual activity: Not on file  Other Topics Concern  . Not on file  Social History Narrative   She works at [employer].    Not married    1 child ( boy)    Past Surgical History:  Procedure Laterality Date  . CESAREAN SECTION  01/11/2005    Family History  Problem Relation Age of Onset  . Sudden Cardiac Death Mother        MI  . Breast cancer Paternal Aunt        3  . Hypertension Maternal Grandmother   . Diabetes Maternal Grandmother   . Hypertension Maternal Aunt     Allergies  Allergen Reactions  . Milk-Related Compounds Swelling    Current Outpatient Medications on File Prior to Visit  Medication  Sig Dispense Refill  . cetirizine (ZYRTEC) 10 MG tablet Take 10 mg by mouth daily.    . naproxen sodium (ANAPROX DS) 550 MG tablet Take 1 tablet (550 mg total) by mouth 2 (two) times daily with a meal. 30 tablet 2   No current facility-administered medications on file prior to visit.     BP (!) 130/100   Temp 98.2 F (36.8 C) (Oral)   Wt 203 lb (92.1 kg)   BMI 32.77 kg/m       Objective:   Physical Exam  Constitutional: She is oriented to person, place, and time. She appears well-developed and well-nourished.  Cardiovascular: Normal rate, regular rhythm, normal heart sounds and intact distal pulses.  Pulmonary/Chest: Effort normal and breath sounds normal.  Neurological: She is alert and oriented to person, place, and time.  Skin: Skin is warm and dry.  Psychiatric: She has a normal mood and affect. Her behavior is normal. Judgment and thought content normal.  Nursing note and vitals reviewed.     Assessment & Plan:  1. Other  migraine with status migrainosus, intractable  - ketorolac (TORADOL) injection 60 mg - SUMAtriptan (IMITREX) 25 MG tablet; Take 1 tablet (25 mg total) by mouth every 2 (two) hours as needed for migraine. May repeat in 2 hours if headache persists or recurs.  Dispense: 10 tablet; Refill: 0 - Follow up if no improvement  2. Other obesity  - Amb Ref to Medical Weight Management  3. Elevated blood pressure reading - will have her monitor her BP at home and send me results via mychart   Shirline Frees, NP

## 2018-08-14 ENCOUNTER — Other Ambulatory Visit: Payer: Self-pay | Admitting: Adult Health

## 2018-08-14 ENCOUNTER — Encounter: Payer: Self-pay | Admitting: Adult Health

## 2018-08-14 MED ORDER — AMITRIPTYLINE HCL 10 MG PO TABS: 10.0000 mg | ORAL_TABLET | Freq: Every day | ORAL | 1 refills | Status: DC

## 2018-11-01 ENCOUNTER — Encounter (INDEPENDENT_AMBULATORY_CARE_PROVIDER_SITE_OTHER): Payer: Managed Care, Other (non HMO)

## 2018-11-15 ENCOUNTER — Encounter (INDEPENDENT_AMBULATORY_CARE_PROVIDER_SITE_OTHER): Payer: Self-pay | Admitting: Family Medicine

## 2018-11-15 ENCOUNTER — Ambulatory Visit (INDEPENDENT_AMBULATORY_CARE_PROVIDER_SITE_OTHER): Payer: Managed Care, Other (non HMO) | Admitting: Family Medicine

## 2018-11-15 VITALS — BP 162/100 | HR 77 | Temp 98.1°F | Ht 66.0 in | Wt 193.0 lb

## 2018-11-15 DIAGNOSIS — I10 Essential (primary) hypertension: Secondary | ICD-10-CM | POA: Diagnosis not present

## 2018-11-15 DIAGNOSIS — Z0289 Encounter for other administrative examinations: Secondary | ICD-10-CM

## 2018-11-15 DIAGNOSIS — Z1331 Encounter for screening for depression: Secondary | ICD-10-CM | POA: Diagnosis not present

## 2018-11-15 DIAGNOSIS — R0602 Shortness of breath: Secondary | ICD-10-CM | POA: Diagnosis not present

## 2018-11-15 DIAGNOSIS — E669 Obesity, unspecified: Secondary | ICD-10-CM

## 2018-11-15 DIAGNOSIS — Z6831 Body mass index (BMI) 31.0-31.9, adult: Secondary | ICD-10-CM

## 2018-11-15 DIAGNOSIS — Z9189 Other specified personal risk factors, not elsewhere classified: Secondary | ICD-10-CM

## 2018-11-15 DIAGNOSIS — R5383 Other fatigue: Secondary | ICD-10-CM | POA: Diagnosis not present

## 2018-11-15 NOTE — Progress Notes (Signed)
Office: [phone]  /  Fax: [phone]   Dear Shirline Frees, NP,   Thank you for referring Keanah Safer to our clinic. The following note includes my evaluation and treatment recommendations.  HPI:   Chief Complaint: OBESITY    Fredonia Deshaies has been referred by Shirline Frees, NP for consultation regarding her obesity and obesity related comorbidities.    Audrey Stafford (MR# [id number]) is a 38 y.o. female who presents on 11/15/2018 for obesity evaluation and treatment. Current BMI is Body mass index is 31.15 kg/m.  Aleasia has been struggling with her weight for many years and has been unsuccessful in either losing weight, maintaining weight loss, or reaching her healthy weight goal.     [name] attended our information session and states she is currently in the action stage of change and ready to dedicate time achieving and maintaining a healthier weight. Hiley is interested in becoming our patient and working on intensive lifestyle modifications including (but not limited to) diet, exercise and weight loss.    Tailah states her family eats meals together she thinks her family will sometimes eat healthier with her her desired weight loss is 43 lbs she started gaining weight within the last 5 years her heaviest weight ever was 198 lbs. she has significant food cravings issues  she snacks frequently in the evenings she skips meals frequently she is frequently drinking liquids with calories she frequently makes poor food choices she has problems with excessive hunger  she frequently eats larger portions than normal  she has binge eating behaviors she struggles with emotional eating    Fatigue Alize feels her energy is lower than it should be. This has worsened with weight gain and has not worsened recently. Elany admits to daytime somnolence and admits to waking up still tired. Patient is at risk for obstructive sleep apnea. Patent has a history of symptoms of  hypertension. Patient generally gets 7 hours of sleep per night, and states they generally have restless sleep. Snoring is present. Apneic episodes are not present. Epworth Sleepiness Score is 11.  Dyspnea on exertion Eiza notes increasing shortness of breath with exercising and seems to be worsening over time with weight gain. She notes getting out of breath sooner with activity than she used to. This has not gotten worse recently. Nohealani denies orthopnea.  Hypertension Corynn Gorsky is a 38 y.o. female with hypertension. Makynna's blood pressure is elevated today at 162/100 on atenolol. She states that it is normally controlled. She is working on weight loss to help control her blood pressure with the goal of decreasing her risk of heart attack and stroke. Torrin denies chest pain.  At risk for cardiovascular disease Riverlyn is at a higher than average risk for cardiovascular disease due to hypertension and obesity. She currently denies any chest pain.  Depression Screen Jamila's Food and Mood (modified PHQ-9) score was 13. Depression screen PHQ 2/9 11/15/2018  Decreased Interest 1  Down, Depressed, Hopeless 1  PHQ - 2 Score 2  Altered sleeping 2  Tired, decreased energy 3  Change in appetite 3  Feeling bad or failure about yourself  1  Trouble concentrating 2  Moving slowly or fidgety/restless 0  Suicidal thoughts 0  PHQ-9 Score 13  Difficult doing work/chores Not difficult at all   ASSESSMENT AND PLAN:  Other fatigue - Plan: EKG 12-Lead, Hemoglobin A1c, Insulin, random, VITAMIN D 25 Hydroxy (Vit-D Deficiency, Fractures), Vitamin B12, Folate, T3, T4, free, TSH  SOBOE (shortness of breath on  exertion) - Plan: Hemoglobin A1c, Insulin, random, VITAMIN D 25 Hydroxy (Vit-D Deficiency, Fractures), Vitamin B12, Folate, T3, T4, free, TSH  Essential hypertension - Plan: Comprehensive metabolic panel, CBC With Differential, Hemoglobin A1c, Insulin, random, Lipid Panel With LDL/HDL  Ratio  Depression screening  At risk for heart disease  Class 1 obesity with serious comorbidity and body mass index (BMI) of 31.0 to 31.9 in adult, unspecified obesity type  PLAN:  Fatigue Cydni was informed that her fatigue may be related to obesity, depression or many other causes. Labs will be ordered, and in the meanwhile Devlynn has agreed to work on diet, exercise and weight loss to help with fatigue. Proper sleep hygiene was discussed including the need for 7-8 hours of quality sleep each night. A sleep study was not ordered based on symptoms and Epworth score. An EKG and an indirect calorimetry was ordered today and Myriah agrees to follow up in 2 weeks.  Dyspnea on exertion Cianna's shortness of breath appears to be obesity related and exercise induced. She has agreed to work on weight loss and gradually increase exercise to treat her exercise induced shortness of breath. If Ellinor follows our instructions and loses weight without improvement of her shortness of breath, we will plan to refer to pulmonology. We will monitor this condition regularly. Labs, an indirect calorimetry, and an EKG was ordered today. Taleen agrees to this plan.  Hypertension We discussed sodium restriction, working on healthy weight loss, and a regular exercise program as the means to achieve improved blood pressure control. We will continue to monitor her blood pressure as well as her progress with the above lifestyle modifications. She will continue her medications as prescribed and will watch for signs of hypotension as she continues her lifestyle modifications. We will order labs today and she agrees to start her diet prescription. Tykeria agreed with this plan and agreed to follow up as directed.  Cardiovascular risk counseling Letonya was given extended (15 minutes) coronary artery disease prevention counseling today. She is 38 y.o. female and has risk factors for heart disease including hypertension  and obesity. We discussed intensive lifestyle modifications today with an emphasis on specific weight loss instructions and strategies. Pt was also informed of the importance of increasing exercise and decreasing saturated fats to help prevent heart disease.  Depression Screen Rosielee had a moderately positive depression screening. Depression is commonly associated with obesity and often results in emotional eating behaviors. We will monitor this closely and work on CBT to help improve the non-hunger eating patterns. Referral to Psychology may be required if no improvement is seen as she continues in our clinic.  Obesity Karyme is currently in the action stage of change and her goal is to continue with weight loss efforts. I recommend Cyncere begin the structured treatment plan as follows:  She has agreed to follow the Category 3 plan without milk. Marlaine HindChanika has been instructed to eventually work up to a goal of 150 minutes of combined cardio and strengthening exercise per week for weight loss and overall health benefits. We discussed the following Behavioral Modification Strategies today: increasing lean protein intake, decreasing simple carbohydrates, and work on meal planning and easy cooking plans   She was informed of the importance of frequent follow up visits to maximize her success with intensive lifestyle modifications for her multiple health conditions. She was informed we would discuss her lab results at her next visit unless there is a critical issue that needs to be addressed sooner. [employer] EnergyChanika  agreed to keep her next visit at the agreed upon time to discuss these results.  ALLERGIES: Allergies  Allergen Reactions  . Milk-Related Compounds Swelling    MEDICATIONS: Current Outpatient Medications on File Prior to Visit  Medication Sig Dispense Refill  . atenolol (TENORMIN) 25 MG tablet Take 25 mg by mouth daily.    . baclofen (LIORESAL) 10 MG tablet Take 1-2 tablets by mouth three  times daily as needed    . naproxen sodium (ANAPROX DS) 550 MG tablet Take 1 tablet (550 mg total) by mouth 2 (two) times daily with a meal. 30 tablet 2   No current facility-administered medications on file prior to visit.     PAST MEDICAL HISTORY: Past Medical History:  Diagnosis Date  . Anemia   . Arthritis    Knees  . Back pain   . Chest pain   . Constipation   . Dysmenorrhea   . GERD (gastroesophageal reflux disease)   . Hypertension   . Joint pain   . Migraine without aura   . Multiple food allergies    Milk  . Palpitations     PAST SURGICAL HISTORY: Past Surgical History:  Procedure Laterality Date  . CESAREAN SECTION  01/11/2005    SOCIAL HISTORY: Social History   Tobacco Use  . Smoking status: Never Smoker  . Smokeless tobacco: Never Used  Substance Use Topics  . Alcohol use: No    Frequency: Never  . Drug use: No    FAMILY HISTORY: Family History  Problem Relation Age of Onset  . Sudden Cardiac Death Mother        MI  . Breast cancer Paternal Aunt        3  . Hypertension Maternal Grandmother   . Diabetes Maternal Grandmother   . Hypertension Maternal Aunt    ROS: Review of Systems  Constitutional: Positive for malaise/fatigue. Negative for weight loss.  Eyes:       Wears glasses or contacts.  Respiratory: Positive for shortness of breath.   Cardiovascular: Positive for palpitations. Negative for chest pain and orthopnea.  Musculoskeletal: Positive for back pain, joint pain and myalgias.  Skin:       Positive for dryness.  Neurological: Positive for weakness and headaches.  Endo/Heme/Allergies:       Negative for heat intolerance. Negative for cold intolerance. Positive for very cold hands and feet.  Psychiatric/Behavioral: The patient has insomnia.    PHYSICAL EXAM: Blood pressure (!) 162/100, pulse 77, temperature 98.1 F (36.7 C), temperature source Oral, height 5\' 6"  (1.676 m), weight 193 lb (87.5 kg), SpO2 98 %. Body mass index  is 31.15 kg/m. Physical Exam Vitals signs reviewed.  Constitutional:      Appearance: Normal appearance. She is obese.  HENT:     Head: Normocephalic and atraumatic.     Nose: Nose normal.  Eyes:     General: No scleral icterus.    Extraocular Movements: Extraocular movements intact.  Neck:     Musculoskeletal: Normal range of motion and neck supple.     Comments: Negative for thyromegaly. Cardiovascular:     Rate and Rhythm: Normal rate and regular rhythm.  Pulmonary:     Effort: Pulmonary effort is normal. No respiratory distress.  Abdominal:     Palpations: Abdomen is soft.     Tenderness: There is no abdominal tenderness.     Comments: Positive for obesity.  Musculoskeletal:     Comments: ROM normal in all extremities.  Skin:  General: Skin is warm and dry.  Neurological:     Mental Status: She is alert and oriented to person, place, and time.     Coordination: Coordination normal.  Psychiatric:        Mood and Affect: Mood normal.        Behavior: Behavior normal.    RECENT LABS AND TESTS: BMET    Component Value Date/Time   NA 141 11/15/2018 1046   K 4.5 11/15/2018 1046   CL 105 11/15/2018 1046   CO2 16 (L) 11/15/2018 1046   GLUCOSE 81 11/15/2018 1046   GLUCOSE 87 07/04/2017 0830   BUN 9 11/15/2018 1046   CREATININE 0.65 11/15/2018 1046   CREATININE 0.65 03/08/2013 1706   CALCIUM 10.3 (H) 11/15/2018 1046   GFRNONAA 115 11/15/2018 1046   GFRAA 132 11/15/2018 1046   Lab Results  Component Value Date   HGBA1C 5.2 11/15/2018   Lab Results  Component Value Date   INSULIN 8.5 11/15/2018   CBC    Component Value Date/Time   WBC 4.8 11/15/2018 1046   WBC 5.0 07/04/2017 0830   RBC 4.70 11/15/2018 1046   RBC 4.17 07/04/2017 0830   HGB 13.9 11/15/2018 1046   HCT 43.3 11/15/2018 1046   PLT 275 11/16/2017   MCV 92 11/15/2018 1046   MCH 29.6 11/15/2018 1046   MCH 29.8 10/30/2016 0415   MCHC 32.1 11/15/2018 1046   MCHC 33.0 07/04/2017 0830   RDW  13.3 11/15/2018 1046   LYMPHSABS 2.0 11/15/2018 1046   MONOABS 0.4 07/04/2017 0830   EOSABS 0.1 11/15/2018 1046   BASOSABS 0.1 11/15/2018 1046   Iron/TIBC/Ferritin/ %Sat    Component Value Date/Time   FERRITIN 58.4 08/21/2015 1443   Lipid Panel     Component Value Date/Time   CHOL 192 11/15/2018 1046   TRIG 61 11/15/2018 1046   HDL 35 (L) 11/15/2018 1046   CHOLHDL 4 07/04/2017 0830   VLDL 13.8 07/04/2017 0830   LDLCALC 145 (H) 11/15/2018 1046   Hepatic Function Panel     Component Value Date/Time   PROT 7.6 11/15/2018 1046   ALBUMIN 4.6 11/15/2018 1046   AST 22 11/15/2018 1046   ALT 21 11/15/2018 1046   ALKPHOS 103 11/15/2018 1046   BILITOT 0.6 11/15/2018 1046   BILIDIR 0.1 07/04/2017 0830      Component Value Date/Time   TSH 0.878 11/15/2018 1046   TSH 1.48 11/16/2017   TSH 2.02 07/04/2017 0830   TSH 0.57 08/21/2015 1443   ECG  shows NSR with a rate of 86 BPM. INDIRECT CALORIMETER done today shows a VO2 of 283 and a REE of 1979.  Her calculated basal metabolic rate is 1287 thus her basal metabolic rate is better than expected.  OBESITY BEHAVIORAL INTERVENTION VISIT  Today's visit was # 1   Starting weight: 193 lbs Starting date: 11/15/18 Today's weight : Weight: 193 lb (87.5 kg)  Today's date: 11/15/2018 Total lbs lost to date: 0  ASK: We discussed the diagnosis of obesity with Chinita Greenland today and Jentry agreed to give [place] permission to discuss obesity behavioral modification therapy today.  ASSESS: Leonila has the diagnosis of obesity and her BMI today is 31.1. Jaycee is in the action stage of change.   ADVISE: Amiel was educated on the multiple health risks of obesity as well as the benefit of weight loss to improve her health. She was advised of the need for long term treatment and the importance of lifestyle modifications  to improve her current health and to decrease her risk of future health problems.  AGREE: Multiple dietary modification  options and treatment options were discussed and Sabryna agreed to follow the recommendations documented in the above note.  ARRANGE: Chanell was educated on the importance of frequent visits to treat obesity as outlined per CMS and USPSTF guidelines and agreed to schedule her next follow up appointment today.  I, Kirke Corin, am acting as transcriptionist for Wilder Glade, MD   I have reviewed the above documentation for accuracy and completeness, and I agree with the above. -Quillian Quince, MD

## 2018-11-16 ENCOUNTER — Encounter (INDEPENDENT_AMBULATORY_CARE_PROVIDER_SITE_OTHER): Payer: Self-pay | Admitting: Family Medicine

## 2018-11-17 LAB — COMPREHENSIVE METABOLIC PANEL
ALT: 21 IU/L (ref 0–32)
AST: 22 IU/L (ref 0–40)
Albumin/Globulin Ratio: 1.5 (ref 1.2–2.2)
Albumin: 4.6 g/dL (ref 3.8–4.8)
Alkaline Phosphatase: 103 IU/L (ref 39–117)
BUN/Creatinine Ratio: 14 (ref 9–23)
BUN: 9 mg/dL (ref 6–20)
Bilirubin Total: 0.6 mg/dL (ref 0.0–1.2)
CO2: 16 mmol/L — ABNORMAL LOW (ref 20–29)
Calcium: 10.3 mg/dL — ABNORMAL HIGH (ref 8.7–10.2)
Chloride: 105 mmol/L (ref 96–106)
Creatinine, Ser: 0.65 mg/dL (ref 0.57–1.00)
GFR calc Af Amer: 132 mL/min/{1.73_m2} (ref >59)
GFR calc non Af Amer: 115 mL/min/{1.73_m2} (ref >59)
Globulin, Total: 3 g/dL (ref 1.5–4.5)
Glucose: 81 mg/dL (ref 65–99)
Potassium: 4.5 mmol/L (ref 3.5–5.2)
Sodium: 141 mmol/L (ref 134–144)
Total Protein: 7.6 g/dL (ref 6.0–8.5)

## 2018-11-17 LAB — CBC WITH DIFFERENTIAL
Basophils Absolute: 0.1 10*3/uL (ref 0.0–0.2)
Basos: 2 %
EOS (ABSOLUTE): 0.1 10*3/uL (ref 0.0–0.4)
Eos: 2 %
Hematocrit: 43.3 % (ref 34.0–46.6)
Hemoglobin: 13.9 g/dL (ref 11.1–15.9)
Immature Grans (Abs): 0 10*3/uL (ref 0.0–0.1)
Immature Granulocytes: 1 %
Lymphocytes Absolute: 2 10*3/uL (ref 0.7–3.1)
Lymphs: 42 %
MCH: 29.6 pg (ref 26.6–33.0)
MCHC: 32.1 g/dL (ref 31.5–35.7)
MCV: 92 fL (ref 79–97)
Monocytes Absolute: 0.4 10*3/uL (ref 0.1–0.9)
Monocytes: 8 %
Neutrophils Absolute: 2.2 10*3/uL (ref 1.4–7.0)
Neutrophils: 45 %
RBC: 4.7 x10E6/uL (ref 3.77–5.28)
RDW: 13.3 % (ref 11.7–15.4)
WBC: 4.8 10*3/uL (ref 3.4–10.8)

## 2018-11-17 LAB — LIPID PANEL WITH LDL/HDL RATIO
Cholesterol, Total: 192 mg/dL (ref 100–199)
HDL: 35 mg/dL — ABNORMAL LOW (ref >39)
LDL Calculated: 145 mg/dL — ABNORMAL HIGH (ref 0–99)
LDl/HDL Ratio: 4.1 ratio — ABNORMAL HIGH (ref 0.0–3.2)
Triglycerides: 61 mg/dL (ref 0–149)
VLDL Cholesterol Cal: 12 mg/dL (ref 5–40)

## 2018-11-17 LAB — TSH: TSH: 0.878 u[IU]/mL (ref 0.450–4.500)

## 2018-11-17 LAB — INSULIN, RANDOM: INSULIN: 8.5 u[IU]/mL (ref 2.6–24.9)

## 2018-11-17 LAB — T3: T3, Total: 120 ng/dL (ref 71–180)

## 2018-11-17 LAB — HEMOGLOBIN A1C
Est. average glucose Bld gHb Est-mCnc: 103 mg/dL
Hgb A1c MFr Bld: 5.2 % (ref 4.8–5.6)

## 2018-11-17 LAB — VITAMIN D 25 HYDROXY (VIT D DEFICIENCY, FRACTURES): Vit D, 25-Hydroxy: 5 ng/mL — ABNORMAL LOW (ref 30.0–100.0)

## 2018-11-17 LAB — VITAMIN B12: Vitamin B-12: 374 pg/mL (ref 232–1245)

## 2018-11-17 LAB — T4, FREE: Free T4: 0.96 ng/dL (ref 0.82–1.77)

## 2018-11-17 LAB — FOLATE: Folate: 11.2 ng/mL (ref >3.0)

## 2018-11-27 ENCOUNTER — Ambulatory Visit (INDEPENDENT_AMBULATORY_CARE_PROVIDER_SITE_OTHER): Payer: Managed Care, Other (non HMO) | Admitting: Family Medicine

## 2018-11-27 ENCOUNTER — Encounter (INDEPENDENT_AMBULATORY_CARE_PROVIDER_SITE_OTHER): Payer: Self-pay | Admitting: Family Medicine

## 2018-11-27 VITALS — BP 147/86 | HR 80 | Temp 98.1°F | Ht 66.0 in | Wt 190.0 lb

## 2018-11-27 DIAGNOSIS — E8881 Metabolic syndrome: Secondary | ICD-10-CM

## 2018-11-27 DIAGNOSIS — E559 Vitamin D deficiency, unspecified: Secondary | ICD-10-CM

## 2018-11-27 DIAGNOSIS — E669 Obesity, unspecified: Secondary | ICD-10-CM

## 2018-11-27 DIAGNOSIS — I10 Essential (primary) hypertension: Secondary | ICD-10-CM | POA: Diagnosis not present

## 2018-11-27 DIAGNOSIS — E7849 Other hyperlipidemia: Secondary | ICD-10-CM

## 2018-11-27 DIAGNOSIS — Z9189 Other specified personal risk factors, not elsewhere classified: Secondary | ICD-10-CM | POA: Diagnosis not present

## 2018-11-27 DIAGNOSIS — Z6830 Body mass index (BMI) 30.0-30.9, adult: Secondary | ICD-10-CM

## 2018-11-27 MED ORDER — VITAMIN D (ERGOCALCIFEROL) 1.25 MG (50000 UNIT) PO CAPS: 50000.0000 [IU] | ORAL_CAPSULE | ORAL | 0 refills | Status: DC

## 2018-11-28 NOTE — Progress Notes (Signed)
Office: [phone]  /  Fax: [phone]   HPI:   Chief Complaint: OBESITY Audrey Stafford is here to discuss her progress with her obesity treatment plan. She is on the Category 3 plan and is following her eating plan approximately 100 % of the time. She states she is walking on the treadmill 30 minutes 5 times per week. Audrey Stafford did well with weight loss. Her hunger was controlled and she is getting a bit bored with her plan.  Her weight is 190 lb (86.2 kg) today and has had a weight loss of 3 pounds over a period of 2 weeks since her last visit. She has lost 3 lbs since starting treatment with [place].  Hyperlipidemia (Pure) Audrey Stafford has hyperlipidemia and her LDL was elevated at 145 on 11/15/18 and she is not on a statin. She would like to try diet control and has been trying to improve her cholesterol levels with intensive lifestyle modification including a low saturated fat diet, exercise and weight loss. She denies any chest pain or myalgias.  Vitamin D deficiency Audrey Stafford has a diagnosis of vitamin D deficiency. She is not currently taking vit D and her level is very low. Her calcium is elevated at 10.3 on 11/15/18 and she admits fatigue.   Hypertension Audrey Stafford is a 38 y.o. female with hypertension. Audrey Stafford's blood pressure is elevated today at 147/86 on atenolol. She is attempting to control with improved diet and is working on weight loss to help control her blood pressure with the goal of decreasing her risk of heart attack and stroke. Audrey Stafford denies chest pain or headache.  Insulin Resistance Audrey Stafford has a new diagnosis of insulin resistance based on her elevated fasting insulin level of 8.5 on 11/15/18. Her A1c and glucose levels were normal. Although Audrey Stafford's blood glucose readings are still under good control, insulin resistance puts her at greater risk of metabolic syndrome and diabetes. She admits reduced polyphagia on her diet prescription. She is not taking metformin currently and  continues to work on diet and exercise to decrease risk of diabetes.  At risk for diabetes Audrey Stafford is at higher than average risk for developing diabetes due to her insulin resistance and obesity. She currently denies polyuria or polydipsia.  ASSESSMENT AND PLAN:  Other hyperlipidemia  Vitamin D deficiency - Plan: Vitamin D, Ergocalciferol, (DRISDOL) 1.25 MG (50000 UT) CAPS capsule  Essential hypertension  Insulin resistance  At risk for diabetes mellitus  Class 1 obesity with serious comorbidity and body mass index (BMI) of 30.0 to 30.9 in adult, unspecified obesity type  PLAN:  Hyperlipidemia (Pure) Audrey Stafford was informed of the American Heart Association Guidelines emphasizing intensive lifestyle modifications as the first line treatment for hyperlipidemia. We discussed many lifestyle modifications today in depth, and Sukanya will continue to work on decreasing saturated fats such as fatty red meat, butter and many fried foods. She will also increase vegetables and lean protein in her diet and continue to work on exercise and weight loss efforts. Kalsey agrees to continue with diet and exercise. We will recheck labs in 3 months and she agrees to follow up in 2 weeks.  Vitamin D Deficiency Audrey Stafford was informed that low vitamin D levels contributes to fatigue and are associated with obesity, breast, and colon cancer. She agrees to start to take prescription Vit D @50 ,000 IU every week #4 with no refills and will follow up for routine testing of vitamin D, at least 2-3 times per year. She was informed of the risk of  over-replacement of vitamin D and agrees to not increase her dose unless she discusses this with us first. We will check labs in 3 months and Audrey Stafford agrees to follow up as directed.  Hypertension We discussed sodium restriction, working on healthy weight loss, and a regular exercise program as the means to achieve improved blood pressure control. We will continue to monitor  her blood pressure as well as her progress with the above lifestyle modifications. She will continue her diet, exercise, and medications as prescribed. She will watch for signs of hypotension as she continues her lifestyle modifications. We will recheck in 2 weeks and we may need to adjust medicines. Audrey Stafford agreed with this plan and agreed to follow up as directed.  Insulin Resistance Audrey Stafford will continue to work on weight loss, exercise, and decreasing simple carbohydrates in her diet to help decrease the risk of diabetes. She was informed that eating too many simple carbohydrates or too many calories at one sitting increases the likelihood of GI side effects. Audrey Stafford agreed to continue her diet and follow up with us as directed to monitor her progress in 2 weeks. We will recheck labs in 3 months.  Diabetes risk counseling Audrey Stafford was given extended (30 minutes) diabetes prevention counseling today. She is 38 y.o. female and has risk factors for diabetes including insulin resistance and obesity. We discussed intensive lifestyle modifications today with an emphasis on weight loss as well as increasing exercise and decreasing simple carbohydrates in her diet.  Obesity Audrey Stafford is currently in the action stage of change. As such, her goal is to continue with weight loss efforts. She has agreed to follow the Category 3 plan and keep a food journal of 250 to 350 calories and 25+ grams of protein for breakfast. Audrey Stafford has been instructed to work up to a goal of 150 minutes of combined cardio and strengthening exercise per week for weight loss and overall health benefits. We discussed the following Behavioral Modification Strategies today: increasing lean protein intake, decreasing simple carbohydrates, celebration eating strategies, and work on meal planning and easy cooking plans.  Audrey Stafford has agreed to follow up with our clinic in 2 weeks. She was informed of the importance of frequent follow up visits  to maximize her success with intensive lifestyle modifications for her multiple health conditions.  ALLERGIES: Allergies  Allergen Reactions  . Milk-Related Compounds Swelling    MEDICATIONS: Current Outpatient Medications on File Prior to Visit  Medication Sig Dispense Refill  . atenolol (TENORMIN) 25 MG tablet Take 25 mg by mouth daily.    . baclofen (LIORESAL) 10 MG tablet Take 1-2 tablets by mouth three times daily as needed    . naproxen sodium (ANAPROX DS) 550 MG tablet Take 1 tablet (550 mg total) by mouth 2 (two) times daily with a meal. 30 tablet 2   No current facility-administered medications on file prior to visit.     PAST MEDICAL HISTORY: Past Medical History:  Diagnosis Date  . Anemia   . Arthritis    Knees  . Back pain   . Chest pain   . Constipation   . Dysmenorrhea   . GERD (gastroesophageal reflux disease)   . Hypertension   . Joint pain   . Migraine without aura   . Multiple food allergies    Milk  . Palpitations     PAST SURGICAL HISTORY: Past Surgical History:  Procedure Laterality Date  . CESAREAN SECTION  01/11/2005    SOCIAL HISTORY: Social History  Tobacco Use  . Smoking status: Never Smoker  . Smokeless tobacco: Never Used  Substance Use Topics  . Alcohol use: No    Frequency: Never  . Drug use: No    FAMILY HISTORY: Family History  Problem Relation Age of Onset  . Sudden Cardiac Death Mother        MI  . Breast cancer Paternal Aunt        3  . Hypertension Maternal Grandmother   . Diabetes Maternal Grandmother   . Hypertension Maternal Aunt     ROS: Review of Systems  Constitutional: Positive for malaise/fatigue and weight loss.  Cardiovascular: Negative for chest pain.  Genitourinary:       Negative for polyuria.  Musculoskeletal: Negative for myalgias.  Neurological: Negative for headaches.  Endo/Heme/Allergies: Negative for polydipsia.       Positive for polyphagia.    PHYSICAL EXAM: Blood pressure (!)  147/86, pulse 80, temperature 98.1 F (36.7 C), temperature source Oral, height 5\' 6"  (1.676 m), weight 190 lb (86.2 kg), SpO2 98 %. Body mass index is 30.67 kg/m. Physical Exam Vitals signs reviewed.  Constitutional:      Appearance: Normal appearance. She is obese.  Cardiovascular:     Rate and Rhythm: Normal rate.  Pulmonary:     Effort: Pulmonary effort is normal.  Musculoskeletal: Normal range of motion.  Skin:    General: Skin is warm and dry.  Neurological:     Mental Status: She is alert and oriented to person, place, and time.  Psychiatric:        Mood and Affect: Mood normal.        Behavior: Behavior normal.     RECENT LABS AND TESTS: BMET    Component Value Date/Time   NA 141 11/15/2018 1046   K 4.5 11/15/2018 1046   CL 105 11/15/2018 1046   CO2 16 (L) 11/15/2018 1046   GLUCOSE 81 11/15/2018 1046   GLUCOSE 87 07/04/2017 0830   BUN 9 11/15/2018 1046   CREATININE 0.65 11/15/2018 1046   CREATININE 0.65 03/08/2013 1706   CALCIUM 10.3 (H) 11/15/2018 1046   GFRNONAA 115 11/15/2018 1046   GFRAA 132 11/15/2018 1046   Lab Results  Component Value Date   HGBA1C 5.2 11/15/2018   HGBA1C 5.3 08/21/2015   Lab Results  Component Value Date   INSULIN 8.5 11/15/2018   CBC    Component Value Date/Time   WBC 4.8 11/15/2018 1046   WBC 5.0 07/04/2017 0830   RBC 4.70 11/15/2018 1046   RBC 4.17 07/04/2017 0830   HGB 13.9 11/15/2018 1046   HCT 43.3 11/15/2018 1046   PLT 275 11/16/2017   MCV 92 11/15/2018 1046   MCH 29.6 11/15/2018 1046   MCH 29.8 10/30/2016 0415   MCHC 32.1 11/15/2018 1046   MCHC 33.0 07/04/2017 0830   RDW 13.3 11/15/2018 1046   LYMPHSABS 2.0 11/15/2018 1046   MONOABS 0.4 07/04/2017 0830   EOSABS 0.1 11/15/2018 1046   BASOSABS 0.1 11/15/2018 1046   Iron/TIBC/Ferritin/ %Sat    Component Value Date/Time   FERRITIN 58.4 08/21/2015 1443   Lipid Panel     Component Value Date/Time   CHOL 192 11/15/2018 1046   TRIG 61 11/15/2018 1046    HDL 35 (L) 11/15/2018 1046   CHOLHDL 4 07/04/2017 0830   VLDL 13.8 07/04/2017 0830   LDLCALC 145 (H) 11/15/2018 1046   Hepatic Function Panel     Component Value Date/Time   PROT 7.6 11/15/2018 1046  ALBUMIN 4.6 11/15/2018 1046   AST 22 11/15/2018 1046   ALT 21 11/15/2018 1046   ALKPHOS 103 11/15/2018 1046   BILITOT 0.6 11/15/2018 1046   BILIDIR 0.1 07/04/2017 0830      Component Value Date/Time   TSH 0.878 11/15/2018 1046   TSH 1.48 11/16/2017   TSH 2.02 07/04/2017 0830   TSH 0.57 08/21/2015 1443   Results for MAHLI, GLAHN (MRN [id number]) as of 11/28/2018 10:12  Ref. Range 11/15/2018 10:46  Vitamin D, 25-Hydroxy Latest Ref Range: 30.0 - 100.0 ng/mL 5.0 (L)    OBESITY BEHAVIORAL INTERVENTION VISIT  Today's visit was # 2   Starting weight: 193 lbs Starting date: 11/15/2018 Today's weight : Weight: 190 lb (86.2 kg)  Today's date: 11/27/2018 Total lbs lost to date: 3  ASK: We discussed the diagnosis of obesity with Audrey Stafford today and Audrey Stafford agreed to give [place] permission to discuss obesity behavioral modification therapy today.  ASSESS: Ayane has the diagnosis of obesity and her BMI today is 30.6. Annete is in the action stage of change.   ADVISE: Anhar was educated on the multiple health risks of obesity as well as the benefit of weight loss to improve her health. She was advised of the need for long term treatment and the importance of lifestyle modifications to improve her current health and to decrease her risk of future health problems.  AGREE: Multiple dietary modification options and treatment options were discussed and Jay agreed to follow the recommendations documented in the above note.  ARRANGE: Audrey Stafford was educated on the importance of frequent visits to treat obesity as outlined per CMS and USPSTF guidelines and agreed to schedule her next follow up appointment today.  IKirke Corin, CMA, am acting as transcriptionist for Wilder Glade, MD  I have reviewed the above documentation for accuracy and completeness, and I agree with the above. -Quillian Quince, MD

## 2018-11-29 ENCOUNTER — Ambulatory Visit (INDEPENDENT_AMBULATORY_CARE_PROVIDER_SITE_OTHER): Payer: Managed Care, Other (non HMO) | Admitting: Family Medicine

## 2018-12-18 ENCOUNTER — Ambulatory Visit (INDEPENDENT_AMBULATORY_CARE_PROVIDER_SITE_OTHER): Payer: Managed Care, Other (non HMO) | Admitting: Family Medicine

## 2018-12-18 ENCOUNTER — Encounter (INDEPENDENT_AMBULATORY_CARE_PROVIDER_SITE_OTHER): Payer: Self-pay | Admitting: Family Medicine

## 2018-12-18 VITALS — BP 134/74 | HR 75 | Temp 98.2°F | Ht 66.0 in | Wt 187.0 lb

## 2018-12-18 DIAGNOSIS — Z9189 Other specified personal risk factors, not elsewhere classified: Secondary | ICD-10-CM

## 2018-12-18 DIAGNOSIS — E669 Obesity, unspecified: Secondary | ICD-10-CM

## 2018-12-18 DIAGNOSIS — Z6830 Body mass index (BMI) 30.0-30.9, adult: Secondary | ICD-10-CM | POA: Diagnosis not present

## 2018-12-18 DIAGNOSIS — E559 Vitamin D deficiency, unspecified: Secondary | ICD-10-CM

## 2018-12-18 MED ORDER — VITAMIN D (ERGOCALCIFEROL) 1.25 MG (50000 UNIT) PO CAPS: 50000.0000 [IU] | ORAL_CAPSULE | ORAL | 0 refills | Status: DC

## 2018-12-18 NOTE — Progress Notes (Signed)
Office: [phone]  /  Fax: [phone]   HPI:   Chief Complaint: OBESITY Audrey Stafford is here to discuss her progress with her obesity treatment plan. She is on the Category 3 plan and is following her eating plan approximately 75 % of the time. She states she is exercising 0 minutes 0 times per week. Audrey Stafford continues to do well with weight loss. She has journaled for dinner and likes the freedom this gives her.  Her weight is 187 lb (84.8 kg) today and has had a weight loss of 3 pounds over a period of 2 weeks since her last visit. She has lost 6 lbs since starting treatment with [place].  Vitamin D deficiency Audrey Stafford has a diagnosis of vitamin D deficiency. She is currently stable on vit D, but is not yet at goal. She denies nausea, vomiting, or muscle weakness.  At risk for osteopenia and osteoporosis Audrey Stafford is at higher risk of osteopenia and osteoporosis due to vitamin D deficiency.   ASSESSMENT AND PLAN:  Vitamin D deficiency - Plan: Vitamin D, Ergocalciferol, (DRISDOL) 1.25 MG (50000 UT) CAPS capsule  At risk for osteoporosis  Class 1 obesity with serious comorbidity and body mass index (BMI) of 30.0 to 30.9 in adult, unspecified obesity type  PLAN:  Vitamin D Deficiency Audrey Stafford was informed that low vitamin D levels contributes to fatigue and are associated with obesity, breast, and colon cancer. Audrey Stafford agrees to continue to take prescription Vit D ,000 IU every week #4 with no refills and will follow up for routine testing of vitamin D, at least 2-3 times per year. She was informed of the risk of over-replacement of vitamin D and agrees to not increase her dose unless she discusses this with [place] first. Audrey Stafford agrees to follow up in 4 weeks as directed.  At risk for osteopenia and osteoporosis Audrey Stafford was given extended (15 minutes) osteoporosis prevention counseling today. Audrey Stafford is at risk for osteopenia and osteoporosis due to her vitamin D deficiency. She was encouraged  to take her vitamin D and follow her higher calcium diet and increase strengthening exercise to help strengthen her bones and decrease her risk of osteopenia and osteoporosis.  Obesity Audrey Stafford is currently in the action stage of change. As such, her goal is to continue with weight loss efforts She has agreed to keep a food journal with 1400 to 1600 calories and 85+ grams of protein.  Audrey Stafford has been instructed to work up to a goal of 150 minutes of combined cardio and strengthening exercise per week for weight loss and overall health benefits. We discussed the following Behavioral Modification Strategies today: increasing lean protein intake, decreasing simple carbohydrates, and work on meal planning and easy cooking plans. Audrey Stafford agrees to see Audrey Stafford, our registered dietitian in 2 weeks to review her journal and look at options.  Audrey Stafford has agreed to follow up with our clinic in 4 weeks. She was informed of the importance of frequent follow up visits to maximize her success with intensive lifestyle modifications for her multiple health conditions.  ALLERGIES: Allergies  Allergen Reactions  . Milk-Related Compounds Swelling    MEDICATIONS: Current Outpatient Medications on File Prior to Visit  Medication Sig Dispense Refill  . atenolol (TENORMIN) 25 MG tablet Take 25 mg by mouth daily.    . baclofen (LIORESAL) 10 MG tablet Take 1-2 tablets by mouth three times daily as needed    . naproxen sodium (ANAPROX DS) 550 MG tablet Take 1 tablet (550 mg total) by  mouth 2 (two) times daily with a meal. 30 tablet 2   No current facility-administered medications on file prior to visit.     PAST MEDICAL HISTORY: Past Medical History:  Diagnosis Date  . Anemia   . Arthritis    Knees  . Back pain   . Chest pain   . Constipation   . Dysmenorrhea   . GERD (gastroesophageal reflux disease)   . Hypertension   . Joint pain   . Migraine without aura   . Multiple food allergies    Milk  .  Palpitations     PAST SURGICAL HISTORY: Past Surgical History:  Procedure Laterality Date  . CESAREAN SECTION  01/11/2005    SOCIAL HISTORY: Social History   Tobacco Use  . Smoking status: Never Smoker  . Smokeless tobacco: Never Used  Substance Use Topics  . Alcohol use: No    Frequency: Never  . Drug use: No    FAMILY HISTORY: Family History  Problem Relation Age of Onset  . Sudden Cardiac Death Mother        MI  . Breast cancer Paternal Aunt        3  . Hypertension Maternal Grandmother   . Diabetes Maternal Grandmother   . Hypertension Maternal Aunt     ROS: Review of Systems  Gastrointestinal: Negative for nausea and vomiting.  Musculoskeletal:       Negative for muscle weakness.    PHYSICAL EXAM: Blood pressure 134/74, pulse 75, temperature 98.2 F (36.8 C), temperature source Oral, height 5\' 6"  (1.676 m), weight 187 lb (84.8 kg), SpO2 97 %. Body mass index is 30.18 kg/m. Physical Exam Vitals signs reviewed.  Constitutional:      Appearance: Normal appearance. She is obese.  Cardiovascular:     Rate and Rhythm: Normal rate.  Pulmonary:     Effort: Pulmonary effort is normal.  Musculoskeletal: Normal range of motion.  Skin:    General: Skin is warm and dry.  Neurological:     Mental Status: She is alert and oriented to person, place, and time.  Psychiatric:        Mood and Affect: Mood normal.        Behavior: Behavior normal.     RECENT LABS AND TESTS: BMET    Component Value Date/Time   NA 141 11/15/2018 1046   K 4.5 11/15/2018 1046   CL 105 11/15/2018 1046   CO2 16 (L) 11/15/2018 1046   GLUCOSE 81 11/15/2018 1046   GLUCOSE 87 07/04/2017 0830   BUN 9 11/15/2018 1046   CREATININE 0.65 11/15/2018 1046   CREATININE 0.65 03/08/2013 1706   CALCIUM 10.3 (H) 11/15/2018 1046   GFRNONAA 115 11/15/2018 1046   GFRAA 132 11/15/2018 1046   Lab Results  Component Value Date   HGBA1C 5.2 11/15/2018   HGBA1C 5.3 08/21/2015   Lab Results    Component Value Date   INSULIN 8.5 11/15/2018   CBC    Component Value Date/Time   WBC 4.8 11/15/2018 1046   WBC 5.0 07/04/2017 0830   RBC 4.70 11/15/2018 1046   RBC 4.17 07/04/2017 0830   HGB 13.9 11/15/2018 1046   HCT 43.3 11/15/2018 1046   PLT 275 11/16/2017   MCV 92 11/15/2018 1046   MCH 29.6 11/15/2018 1046   MCH 29.8 10/30/2016 0415   MCHC 32.1 11/15/2018 1046   MCHC 33.0 07/04/2017 0830   RDW 13.3 11/15/2018 1046   LYMPHSABS 2.0 11/15/2018 1046   MONOABS 0.4  07/04/2017 0830   EOSABS 0.1 11/15/2018 1046   BASOSABS 0.1 11/15/2018 1046   Iron/TIBC/Ferritin/ %Sat    Component Value Date/Time   FERRITIN 58.4 08/21/2015 1443   Lipid Panel     Component Value Date/Time   CHOL 192 11/15/2018 1046   TRIG 61 11/15/2018 1046   HDL 35 (L) 11/15/2018 1046   CHOLHDL 4 07/04/2017 0830   VLDL 13.8 07/04/2017 0830   LDLCALC 145 (H) 11/15/2018 1046   Hepatic Function Panel     Component Value Date/Time   PROT 7.6 11/15/2018 1046   ALBUMIN 4.6 11/15/2018 1046   AST 22 11/15/2018 1046   ALT 21 11/15/2018 1046   ALKPHOS 103 11/15/2018 1046   BILITOT 0.6 11/15/2018 1046   BILIDIR 0.1 07/04/2017 0830      Component Value Date/Time   TSH 0.878 11/15/2018 1046   TSH 1.48 11/16/2017   TSH 2.02 07/04/2017 0830   TSH 0.57 08/21/2015 1443   Results for DENESE, DESHANE (MRN [id number]) as of 12/18/2018 13:34  Ref. Range 11/15/2018 10:46  Vitamin D, 25-Hydroxy Latest Ref Range: 30.0 - 100.0 ng/mL 5.0 (L)    OBESITY BEHAVIORAL INTERVENTION VISIT  Today's visit was # 3   Starting weight: 193 lbs Starting date: 11/15/18 Today's weight : Weight: 187 lb (84.8 kg)  Today's date: 12/18/2018 Total lbs lost to date: 6    12/18/2018  Height 5\' 6"  (1.676 m)  Weight 187 lb (84.8 kg)  BMI (Calculated) 30.2  BLOOD PRESSURE - SYSTOLIC 134  BLOOD PRESSURE - DIASTOLIC 74   Body Fat % 37.6 %  Total Body Water (lbs) 73.8 lbs    ASK: We discussed the diagnosis of obesity with  Audrey Stafford today and Audrey Stafford agreed to give [place] permission to discuss obesity behavioral modification therapy today.  ASSESS: Audrey Stafford has the diagnosis of obesity and her BMI today is 30.2. Kahlia is in the action stage of change.   ADVISE: Yaeko was educated on the multiple health risks of obesity as well as the benefit of weight loss to improve her health. She was advised of the need for long term treatment and the importance of lifestyle modifications to improve her current health and to decrease her risk of future health problems.  AGREE: Multiple dietary modification options and treatment options were discussed and Kynzlie agreed to follow the recommendations documented in the above note.  ARRANGE: Shawnea was educated on the importance of frequent visits to treat obesity as outlined per CMS and USPSTF guidelines and agreed to schedule her next follow up appointment today.  IKirke Corin, CMA, am acting as transcriptionist for Wilder Glade, MD  I have reviewed the above documentation for accuracy and completeness, and I agree with the above. -Audrey Quince, MD

## 2019-01-01 ENCOUNTER — Encounter (INDEPENDENT_AMBULATORY_CARE_PROVIDER_SITE_OTHER): Payer: Self-pay | Admitting: Dietician

## 2019-01-01 ENCOUNTER — Ambulatory Visit (INDEPENDENT_AMBULATORY_CARE_PROVIDER_SITE_OTHER): Payer: Managed Care, Other (non HMO) | Admitting: Dietician

## 2019-01-01 VITALS — Ht 66.0 in | Wt 190.0 lb

## 2019-01-01 DIAGNOSIS — E8881 Metabolic syndrome: Secondary | ICD-10-CM | POA: Diagnosis not present

## 2019-01-01 DIAGNOSIS — E669 Obesity, unspecified: Secondary | ICD-10-CM

## 2019-01-01 DIAGNOSIS — Z6830 Body mass index (BMI) 30.0-30.9, adult: Secondary | ICD-10-CM

## 2019-01-01 DIAGNOSIS — Z9189 Other specified personal risk factors, not elsewhere classified: Secondary | ICD-10-CM

## 2019-01-02 NOTE — Progress Notes (Signed)
  Office: [phone]  /  Fax: [phone]     Audrey Stafford has a diagnosis of insulin resistance based on her elevated fasting insulin level >5. Although Audrey Stafford's blood glucose readings are still under good control, insulin resistance puts her at greater risk of metabolic syndrome and diabetes. She is here today for diabetes risk nutrition counseling which includes her obesity treatment plan.   Her weight today is 190 lbs, a 3 lb weight gain since her last visit. She has had a 3 lb weight loss since beginning treatment with Audrey Stafford. She is on a journaling meal plan with goal of 1400-1600 calories and 85+ g protein. She reports she is journaling approximately 50% of her intake and per review of her food record is usually hitting her calorie goal but falling short of her protein goal. She admits her biggest obstacle is journaling "casserole" dishes. We discussed strategies for cooking for the family and stay on her meal plan at length including different ways to prepare meals. Also gave detailed instruction on using the My Fitness Pal application for journaling all food including recipes and commonly eaten meals.   Reviewed food nutrients ie protein, fats, simple and complex carbohydrates and how these affect insulin response as well as portion control and avoiding simple carbohydrates and increasing lean proteins for ongoing wight loss and glucose management.  Audrey Stafford is on the following meal plan: 1400-1600 calories, 85+ g protein Her meal plan was individualized for maximum benefit.  Also discussed at length the following behavioral modifications to help maximize success: increasing lean protein intake, decreasing simple carbohydrates, increasing vegetables, meal planning and cooking strategies, keeping a strict food journal.    Audrey Stafford has been instructed to work up to a goal of 150 minutes of combined cardio and strengthening exercise per week for weight loss and overall health benefits.    Office:  [phone]  /  Fax: [phone]  OBESITY BEHAVIORAL INTERVENTION VISIT  Today's visit was # 4   Starting weight: 193 lbs Starting date: 11/15/2018 Today's weight : Weight: 190 lb (86.2 kg)  Today's date:01/01/2019 Total lbs lost to date: 3 lbs At least 15 minutes were spent on discussing the following behavioral intervention visit.   ASK: We discussed the diagnosis of obesity with Audrey Stafford today and Audrey Stafford agreed to give Audrey Stafford permission to discuss obesity behavioral modification therapy today.  ASSESS: Audrey Stafford has the diagnosis of obesity and her BMI today is 30.6 Audrey Stafford is in the action stage of change   ADVISE: Audrey Stafford was educated on the multiple health risks of obesity as well as the benefit of weight loss to improve her health. She was advised of the need for long term treatment and the importance of lifestyle modifications to improve her current health and to decrease her risk of future health problems.  AGREE: Multiple dietary modification options and treatment options were discussed and  Audrey Stafford agreed to follow the recommendations documented in the above note.  ARRANGE: Audrey Stafford was educated on the importance of frequent visits to treat obesity as outlined per CMS and USPSTF guidelines and agreed to schedule her next follow up appointment today.

## 2019-01-15 ENCOUNTER — Encounter (INDEPENDENT_AMBULATORY_CARE_PROVIDER_SITE_OTHER): Payer: Self-pay | Admitting: Family Medicine

## 2019-01-15 ENCOUNTER — Encounter (INDEPENDENT_AMBULATORY_CARE_PROVIDER_SITE_OTHER): Payer: Self-pay

## 2019-01-15 ENCOUNTER — Other Ambulatory Visit: Payer: Self-pay

## 2019-01-15 ENCOUNTER — Ambulatory Visit (INDEPENDENT_AMBULATORY_CARE_PROVIDER_SITE_OTHER): Payer: Managed Care, Other (non HMO) | Admitting: Family Medicine

## 2019-01-15 DIAGNOSIS — Z6830 Body mass index (BMI) 30.0-30.9, adult: Secondary | ICD-10-CM | POA: Diagnosis not present

## 2019-01-15 DIAGNOSIS — E559 Vitamin D deficiency, unspecified: Secondary | ICD-10-CM | POA: Diagnosis not present

## 2019-01-15 DIAGNOSIS — E669 Obesity, unspecified: Secondary | ICD-10-CM

## 2019-01-15 MED ORDER — VITAMIN D (ERGOCALCIFEROL) 1.25 MG (50000 UNIT) PO CAPS: 50000.0000 [IU] | ORAL_CAPSULE | ORAL | 0 refills | Status: DC

## 2019-01-15 NOTE — Progress Notes (Signed)
Office: [phone]  /  Fax: [phone] TeleHealth Visit:  Audrey Stafford has consented to this TeleHealth visit today via telephone call due to lack of video by patient. The patient is located at home, the provider is located at the [facility] office. The participants in this visit include the listed provider and patient and any and all parties involved.   HPI:   Chief Complaint: OBESITY Audrey Stafford is here to discuss her progress with her obesity treatment plan. She is on the keep a food journal with 1400 to 1600 calories and 85 grams of protein daily plan and she is following her eating plan approximately 80 % of the time. She states she is exercising 0 minutes 0 times per week. Audrey Stafford is continuing to work on her diet, even though she is socially isolating and is working from home. Audrey Stafford that she is struggling with some emotional eating, especially at night; but she is less tempted to snack in the daytime while working from home. She is not weighing herself, but she feels she has at least not gained any weight. We were unable to weight the patient today for this TeleHealth visit.She feels as if she has sometimes lost weight and she sometimes feels that she has gained weight since her last visit.   Vitamin D deficiency Audrey Stafford has a diagnosis of vitamin D deficiency. Her last vitamin D level was at 5.0 and is not yet at goal. She is doing well on vit D. Audrey Stafford denies nausea, vomiting or muscle weakness.  ASSESSMENT AND PLAN:  Vitamin D deficiency - Plan: Vitamin D, Ergocalciferol, (DRISDOL) 1.25 MG (50000 UT) CAPS capsule  Class 1 obesity with serious comorbidity and body mass index (BMI) of 30.0 to 30.9 in adult, unspecified obesity type  PLAN:  Vitamin D Deficiency Audrey Stafford was informed that low vitamin D levels contributes to fatigue and are associated with obesity, breast, and colon cancer. She agrees to continue to take prescription Vit D @50 ,000 IU every  week #4 with no refills and will follow up for routine testing of vitamin D, at least 2-3 times per year. She was informed of the risk of over-replacement of vitamin D and agrees to not increase her dose unless she discusses this with [place] first. We will recheck labs in 1 month and Audrey Stafford agrees to follow up with our clinic in 2 weeks.  Obesity Audrey Stafford is currently in the action stage of change. As such, her goal is to continue with weight loss efforts She has agreed to keep a food journal with 1400 to 1600 calories and 85+ grams of protein daily Audrey Stafford has been instructed to work up to a goal of 150 minutes of combined cardio and strengthening exercise per week for weight loss and overall health benefits. We discussed the following Behavioral Modification Strategies today: keeping healthy foods in the home, better snacking choices, increasing lean protein intake, decreasing simple carbohydrates, work on meal planning and easy cooking plans, emotional eating strategies, ways to avoid boredom eating and ways to avoid night time snacking  Audrey Stafford has agreed to follow up with our clinic in 2 weeks. She was informed of the importance of frequent follow up visits to maximize her success with intensive lifestyle modifications for her multiple health conditions.  ALLERGIES: Allergies  Allergen Reactions   Milk-Related Compounds Swelling    MEDICATIONS: Current Outpatient Medications on File Prior to Visit  Medication Sig Dispense Refill   atenolol (TENORMIN) 25 MG tablet Take 25 mg  by mouth daily.     baclofen (LIORESAL) 10 MG tablet Take 1-2 tablets by mouth three times daily as needed     naproxen sodium (ANAPROX DS) 550 MG tablet Take 1 tablet (550 mg total) by mouth 2 (two) times daily with a meal. 30 tablet 2   No current facility-administered medications on file prior to visit.     PAST MEDICAL HISTORY: Past Medical History:  Diagnosis Date   Anemia    Arthritis    Knees   Back  pain    Chest pain    Constipation    Dysmenorrhea    GERD (gastroesophageal reflux disease)    Hypertension    Joint pain    Migraine without aura    Multiple food allergies    Milk   Palpitations     PAST SURGICAL HISTORY: Past Surgical History:  Procedure Laterality Date   CESAREAN SECTION  01/11/2005    SOCIAL HISTORY: Social History   Tobacco Use   Smoking status: Never Smoker   Smokeless tobacco: Never Used  Substance Use Topics   Alcohol use: No    Frequency: Never   Drug use: No    FAMILY HISTORY: Family History  Problem Relation Age of Onset   Sudden Cardiac Death Mother        MI   Breast cancer Paternal Aunt        3   Hypertension Maternal Grandmother    Diabetes Maternal Grandmother    Hypertension Maternal Aunt     ROS: Review of Systems  Gastrointestinal: Negative for nausea and vomiting.  Musculoskeletal:       Negative for muscle weakness    PHYSICAL EXAM: Pt in no acute distress  RECENT LABS AND TESTS: BMET    Component Value Date/Time   NA 141 11/15/2018 1046   K 4.5 11/15/2018 1046   CL 105 11/15/2018 1046   CO2 16 (L) 11/15/2018 1046   GLUCOSE 81 11/15/2018 1046   GLUCOSE 87 07/04/2017 0830   BUN 9 11/15/2018 1046   CREATININE 0.65 11/15/2018 1046   CREATININE 0.65 03/08/2013 1706   CALCIUM 10.3 (H) 11/15/2018 1046   GFRNONAA 115 11/15/2018 1046   GFRAA 132 11/15/2018 1046   Lab Results  Component Value Date   HGBA1C 5.2 11/15/2018   HGBA1C 5.3 08/21/2015   Lab Results  Component Value Date   INSULIN 8.5 11/15/2018   CBC    Component Value Date/Time   WBC 4.8 11/15/2018 1046   WBC 5.0 07/04/2017 0830   RBC 4.70 11/15/2018 1046   RBC 4.17 07/04/2017 0830   HGB 13.9 11/15/2018 1046   HCT 43.3 11/15/2018 1046   PLT 275 11/16/2017   MCV 92 11/15/2018 1046   MCH 29.6 11/15/2018 1046   MCH 29.8 10/30/2016 0415   MCHC 32.1 11/15/2018 1046   MCHC 33.0 07/04/2017 0830   RDW 13.3 11/15/2018 1046    LYMPHSABS 2.0 11/15/2018 1046   MONOABS 0.4 07/04/2017 0830   EOSABS 0.1 11/15/2018 1046   BASOSABS 0.1 11/15/2018 1046   Iron/TIBC/Ferritin/ %Sat    Component Value Date/Time   FERRITIN 58.4 08/21/2015 1443   Lipid Panel     Component Value Date/Time   CHOL 192 11/15/2018 1046   TRIG 61 11/15/2018 1046   HDL 35 (L) 11/15/2018 1046   CHOLHDL 4 07/04/2017 0830   VLDL 13.8 07/04/2017 0830   LDLCALC 145 (H) 11/15/2018 1046   Hepatic Function Panel     Component Value  Date/Time   PROT 7.6 11/15/2018 1046   ALBUMIN 4.6 11/15/2018 1046   AST 22 11/15/2018 1046   ALT 21 11/15/2018 1046   ALKPHOS 103 11/15/2018 1046   BILITOT 0.6 11/15/2018 1046   BILIDIR 0.1 07/04/2017 0830      Component Value Date/Time   TSH 0.878 11/15/2018 1046   TSH 1.48 11/16/2017   TSH 2.02 07/04/2017 0830   TSH 0.57 08/21/2015 1443   Results for Audrey Stafford, Audrey Stafford (MRN [id number]) as of 01/15/2019 17:52  Ref. Range 11/15/2018 10:46  Vitamin D, 25-Hydroxy Latest Ref Range: 30.0 - 100.0 ng/mL 5.0 (L)     I, [place] CraneJoanne Murray, am acting as transcriptionist for Quillian Quincearen Beasley, MD I have reviewed the above documentation for accuracy and completeness, and I agree with the above. -Quillian Quincearen Beasley, MD

## 2019-01-29 ENCOUNTER — Encounter (INDEPENDENT_AMBULATORY_CARE_PROVIDER_SITE_OTHER): Payer: Self-pay | Admitting: Family Medicine

## 2019-01-29 ENCOUNTER — Ambulatory Visit (INDEPENDENT_AMBULATORY_CARE_PROVIDER_SITE_OTHER): Payer: Managed Care, Other (non HMO) | Admitting: Family Medicine

## 2019-01-29 ENCOUNTER — Other Ambulatory Visit: Payer: Self-pay

## 2019-01-29 DIAGNOSIS — E8881 Metabolic syndrome: Secondary | ICD-10-CM | POA: Diagnosis not present

## 2019-01-29 DIAGNOSIS — E669 Obesity, unspecified: Secondary | ICD-10-CM

## 2019-01-29 DIAGNOSIS — Z6830 Body mass index (BMI) 30.0-30.9, adult: Secondary | ICD-10-CM

## 2019-01-29 NOTE — Progress Notes (Signed)
Office: [phone]  /  Fax: [phone] TeleHealth Visit:  Audrey Stafford has verbally consented to this TeleHealth visit today. The patient is located at work, the provider is located at the [facility] office. The participants in this visit include the listed provider and patient. Audrey Stafford was unable to use Face Time and the Telehealth visit was conducted via telephone.  HPI:   Chief Complaint: OBESITY Audrey Stafford is here to discuss her progress with her obesity treatment plan. She is keeping a food journal with 1400 to 1600 calories and 85 grams of protein and is following her eating plan approximately 25 % of the time. She states she is exercising with YouTube videos 30 minutes 3 times per week. Audrey Stafford continues to struggle with meal planning and journaling. She is mostly portion control and smart choices, but is especially struggling to meet her protein goals. She thinks that she may have gained 1 to 2 pounds.  We were unable to weigh the patient today for this TeleHealth visit. She feels as if she has gained weight since her last visit. She has lost 6 lbs since starting treatment with [place].  Insulin Resistance [place] has a diagnosis of insulin resistance based on her elevated fasting insulin level >5. Although Ikeisha's blood glucose readings are still under good control, insulin resistance puts her at greater risk of metabolic syndrome and diabetes. She is trying to decrease simple carbs in her diet, but has changed to eating higher sugar fruits frequently. She admits polyphagia. Audrey Stafford is not taking metformin currently and continues to work on diet and exercise to decrease risk of diabetes.  ASSESSMENT AND PLAN:  Insulin resistance  Class 1 obesity with serious comorbidity and body mass index (BMI) of 30.0 to 30.9 in adult, unspecified obesity type  PLAN:  Insulin Resistance [name] was educated on lower sugar fruit options as well as increasing lean protein and  vegetables. [name] voiced understanding. She will continue to work on weight loss, exercise, and decreasing simple carbohydrates in her diet to help decrease the risk of diabetes. She was informed that eating too many simple carbohydrates or too many calories at one sitting increases the likelihood of GI side effects. [name] agreed to follow up with [place] as directed to monitor her progress.   I spent > than 50% of the 15 minute visit on counseling as documented in the note.  Obesity Audrey Stafford is currently in the action stage of change. As such, her goal is to continue with weight loss efforts. She has agreed to keep a food journal with 1400 to 1600 calories and 85 grams of protein.  Clera has been instructed to work up to a goal of 150 minutes of combined cardio and strengthening exercise per week for weight loss and overall health benefits. We discussed the following Behavioral Modification Strategies today: increasing lean protein intake, decreasing simple carbohydrates, keeping healthy foods in the home, better snacking choices, and planning for success.   Audrey Stafford has agreed to follow up with our clinic in 2 weeks. She was informed of the importance of frequent follow up visits to maximize her success with intensive lifestyle modifications for her multiple health conditions.  ALLERGIES: Allergies  Allergen Reactions  . Milk-Related Compounds Swelling    MEDICATIONS: Current Outpatient Medications on File Prior to Visit  Medication Sig Dispense Refill  . atenolol (TENORMIN) 25 MG tablet Take 25 mg by mouth daily.    . baclofen (LIORESAL) 10 MG tablet Take 1-2 tablets by mouth three  times daily as needed    . naproxen sodium (ANAPROX DS) 550 MG tablet Take 1 tablet (550 mg total) by mouth 2 (two) times daily with a meal. 30 tablet 2  . Vitamin D, Ergocalciferol, (DRISDOL) 1.25 MG (50000 UT) CAPS capsule Take 1 capsule (50,000 Units total) by mouth every 7 (seven) days. 4 capsule 0   No  current facility-administered medications on file prior to visit.     PAST MEDICAL HISTORY: Past Medical History:  Diagnosis Date  . Anemia   . Arthritis    Knees  . Back pain   . Chest pain   . Constipation   . Dysmenorrhea   . GERD (gastroesophageal reflux disease)   . Hypertension   . Joint pain   . Migraine without aura   . Multiple food allergies    Milk  . Palpitations     PAST SURGICAL HISTORY: Past Surgical History:  Procedure Laterality Date  . CESAREAN SECTION  01/11/2005    SOCIAL HISTORY: Social History   Tobacco Use  . Smoking status: Never Smoker  . Smokeless tobacco: Never Used  Substance Use Topics  . Alcohol use: No    Frequency: Never  . Drug use: No    FAMILY HISTORY: Family History  Problem Relation Age of Onset  . Sudden Cardiac Death Mother        MI  . Breast cancer Paternal Aunt        3  . Hypertension Maternal Grandmother   . Diabetes Maternal Grandmother   . Hypertension Maternal Aunt     ROS: Review of Systems  Endo/Heme/Allergies:       Positive for polyphagia.    PHYSICAL EXAM: Pt in no acute distress  RECENT LABS AND TESTS: BMET    Component Value Date/Time   NA 141 11/15/2018 1046   K 4.5 11/15/2018 1046   CL 105 11/15/2018 1046   CO2 16 (L) 11/15/2018 1046   GLUCOSE 81 11/15/2018 1046   GLUCOSE 87 07/04/2017 0830   BUN 9 11/15/2018 1046   CREATININE 0.65 11/15/2018 1046   CREATININE 0.65 03/08/2013 1706   CALCIUM 10.3 (H) 11/15/2018 1046   GFRNONAA 115 11/15/2018 1046   GFRAA 132 11/15/2018 1046   Lab Results  Component Value Date   HGBA1C 5.2 11/15/2018   HGBA1C 5.3 08/21/2015   Lab Results  Component Value Date   INSULIN 8.5 11/15/2018   CBC    Component Value Date/Time   WBC 4.8 11/15/2018 1046   WBC 5.0 07/04/2017 0830   RBC 4.70 11/15/2018 1046   RBC 4.17 07/04/2017 0830   HGB 13.9 11/15/2018 1046   HCT 43.3 11/15/2018 1046   PLT 275 11/16/2017   MCV 92 11/15/2018 1046   MCH 29.6  11/15/2018 1046   MCH 29.8 10/30/2016 0415   MCHC 32.1 11/15/2018 1046   MCHC 33.0 07/04/2017 0830   RDW 13.3 11/15/2018 1046   LYMPHSABS 2.0 11/15/2018 1046   MONOABS 0.4 07/04/2017 0830   EOSABS 0.1 11/15/2018 1046   BASOSABS 0.1 11/15/2018 1046   Iron/TIBC/Ferritin/ %Sat    Component Value Date/Time   FERRITIN 58.4 08/21/2015 1443   Lipid Panel     Component Value Date/Time   CHOL 192 11/15/2018 1046   TRIG 61 11/15/2018 1046   HDL 35 (L) 11/15/2018 1046   CHOLHDL 4 07/04/2017 0830   VLDL 13.8 07/04/2017 0830   LDLCALC 145 (H) 11/15/2018 1046   Hepatic Function Panel     Component  Value Date/Time   PROT 7.6 11/15/2018 1046   ALBUMIN 4.6 11/15/2018 1046   AST 22 11/15/2018 1046   ALT 21 11/15/2018 1046   ALKPHOS 103 11/15/2018 1046   BILITOT 0.6 11/15/2018 1046   BILIDIR 0.1 07/04/2017 0830      Component Value Date/Time   TSH 0.878 11/15/2018 1046   TSH 1.48 11/16/2017   TSH 2.02 07/04/2017 0830   TSH 0.57 08/21/2015 1443   Results for Chinita GreenlandSTOVALL, Elaisha (MRN [id number]) as of 01/29/2019 11:59  Ref. Range 11/15/2018 10:46  Vitamin D, 25-Hydroxy Latest Ref Range: 30.0 - 100.0 ng/mL 5.0 (L)     I, Kirke Corinara Soares, CMA, am acting as transcriptionist for Wilder Gladearen D. Elyanna Wallick, MD I have reviewed the above documentation for accuracy and completeness, and I agree with the above. -Quillian Quincearen Aithan Farrelly, MD

## 2019-02-04 ENCOUNTER — Encounter (INDEPENDENT_AMBULATORY_CARE_PROVIDER_SITE_OTHER): Payer: Self-pay | Admitting: Family Medicine

## 2019-02-04 NOTE — Telephone Encounter (Signed)
Please review

## 2019-02-11 ENCOUNTER — Ambulatory Visit (INDEPENDENT_AMBULATORY_CARE_PROVIDER_SITE_OTHER): Payer: Managed Care, Other (non HMO) | Admitting: Bariatrics

## 2019-02-11 ENCOUNTER — Other Ambulatory Visit: Payer: Self-pay

## 2019-02-11 ENCOUNTER — Encounter (INDEPENDENT_AMBULATORY_CARE_PROVIDER_SITE_OTHER): Payer: Self-pay | Admitting: Bariatrics

## 2019-02-11 DIAGNOSIS — E8881 Metabolic syndrome: Secondary | ICD-10-CM | POA: Diagnosis not present

## 2019-02-11 DIAGNOSIS — E559 Vitamin D deficiency, unspecified: Secondary | ICD-10-CM | POA: Diagnosis not present

## 2019-02-11 DIAGNOSIS — E669 Obesity, unspecified: Secondary | ICD-10-CM | POA: Diagnosis not present

## 2019-02-11 DIAGNOSIS — Z6830 Body mass index (BMI) 30.0-30.9, adult: Secondary | ICD-10-CM | POA: Diagnosis not present

## 2019-02-12 DIAGNOSIS — E669 Obesity, unspecified: Secondary | ICD-10-CM | POA: Insufficient documentation

## 2019-02-12 DIAGNOSIS — Z6830 Body mass index (BMI) 30.0-30.9, adult: Secondary | ICD-10-CM

## 2019-02-12 NOTE — Progress Notes (Signed)
Office: [phone]  /  Fax: [phone] TeleHealth Visit:  Audrey Stafford has verbally consented to this TeleHealth visit today. The patient is located at home, the provider is located at the [facility] office. The participants in this visit include the listed provider and patient. The visit was conducted today via Face Time.  HPI:   Chief Complaint: OBESITY Audrey Stafford is here to discuss her progress with her obesity treatment plan. She is keeping a food journal with 1400 to 1600 calories and 85+ grams of protein and is following her eating plan approximately 75 % of the time. She states she is doing walking and YouTube videos 30 minutes 4 times per week. Audrey Stafford thinks that she has gained " a couple of pounds". She is working from home. She is not stress eating.   We were unable to weigh the patient today for this TeleHealth visit. She feels as if she has gained weight since her last visit. She has lost 6 lbs since starting treatment with [place].  Insulin Resistance (Mild) Audrey Stafford has a diagnosis of insulin resistance based on her elevated fasting insulin level >5. Although Audrey Stafford's blood glucose readings are still under good control, insulin resistance puts her at greater risk of metabolic syndrome and diabetes. She is not taking metformin currently and continues to work on diet and exercise to decrease risk of diabetes. Audrey Stafford denies polyphagia.  Vitamin D Deficiency Audrey Stafford has a diagnosis of vitamin D deficiency. She is currently taking vit D. Audrey Stafford denies nausea, vomiting, or muscle weakness.  ASSESSMENT AND PLAN:  Insulin resistance  Vitamin D deficiency  Class 1 obesity with serious comorbidity and body mass index (BMI) of 30.0 to 30.9 in adult, unspecified obesity type  PLAN:  Insulin Resistance (Mild) Audrey Stafford will continue to work on weight loss, exercise, and decreasing simple carbohydrates in her diet to help decrease the risk of diabetes. She was informed  that eating too many simple carbohydrates or too many calories at one sitting increases the likelihood of GI side effects. Audrey Stafford agreed to decrease carbohydrates, increase protein, and to continue with her exercise. Audrey Stafford agreed to follow up with [place] as directed to monitor her progress in 2 weeks.  Vitamin D Deficiency Audrey Stafford was informed that low vitamin D levels contribute to fatigue and are associated with obesity, breast, and colon cancer. Audrey Stafford agrees to continue to take prescription Vit D @50 ,000 IU every week and will follow up for routine testing of vitamin D, at least 2-3 times per year. She was informed of the risk of over-replacement of vitamin D and agrees to not increase her dose unless she discusses this with [place] first. Audrey Stafford agrees to follow up in 2 weeks as directed.  Obesity Audrey Stafford is currently in the action stage of change. As such, her goal is to continue with weight loss efforts. She has agreed to keep a food journal with 1400 to 1600 calories and 85+ grams of protein. She will do meal planning with increased protein and she was given [employer]. Audrey Stafford will also weigh herself at home. Audrey Stafford has been instructed to work up to a goal of 150 minutes of combined cardio and strengthening exercise per week for weight loss and overall health benefits. We discussed the following Behavioral Modification Strategies today: increasing lean protein intake, decreasing simple carbohydrates, increasing vegetables, increase H2O intake, decrease eating out, no skipping meals, work on meal planning and easy cooking plans, keeping healthy foods in the home, and ways to avoid night  time snacking.  Audrey Stafford has agreed to follow up with our clinic in 2 weeks. She was informed of the importance of frequent follow up visits to maximize her success with intensive lifestyle modifications for her multiple health conditions.  ALLERGIES: Allergies  Allergen Reactions  . Milk-Related Compounds  Swelling    MEDICATIONS: Current Outpatient Medications on File Prior to Visit  Medication Sig Dispense Refill  . atenolol (TENORMIN) 25 MG tablet Take 25 mg by mouth daily.    . baclofen (LIORESAL) 10 MG tablet Take 1-2 tablets by mouth three times daily as needed    . naproxen sodium (ANAPROX DS) 550 MG tablet Take 1 tablet (550 mg total) by mouth 2 (two) times daily with a meal. 30 tablet 2  . Vitamin D, Ergocalciferol, (DRISDOL) 1.25 MG (50000 UT) CAPS capsule Take 1 capsule (50,000 Units total) by mouth every 7 (seven) days. 4 capsule 0   No current facility-administered medications on file prior to visit.     PAST MEDICAL HISTORY: Past Medical History:  Diagnosis Date  . Anemia   . Arthritis    Knees  . Back pain   . Chest pain   . Constipation   . Dysmenorrhea   . GERD (gastroesophageal reflux disease)   . Hypertension   . Joint pain   . Migraine without aura   . Multiple food allergies    Milk  . Palpitations     PAST SURGICAL HISTORY: Past Surgical History:  Procedure Laterality Date  . CESAREAN SECTION  01/11/2005    SOCIAL HISTORY: Social History   Tobacco Use  . Smoking status: Never Smoker  . Smokeless tobacco: Never Used  Substance Use Topics  . Alcohol use: No    Frequency: Never  . Drug use: No    FAMILY HISTORY: Family History  Problem Relation Age of Onset  . Sudden Cardiac Death Mother        MI  . Breast cancer Paternal Aunt        3  . Hypertension Maternal Grandmother   . Diabetes Maternal Grandmother   . Hypertension Maternal Aunt     ROS: Review of Systems  Gastrointestinal: Negative for nausea and vomiting.  Musculoskeletal:       Negative for muscle weakness.  Endo/Heme/Allergies:       Negative for polyphagia.    PHYSICAL EXAM: Pt in no acute distress  RECENT LABS AND TESTS: BMET    Component Value Date/Time   NA 141 11/15/2018 1046   K 4.5 11/15/2018 1046   CL 105 11/15/2018 1046   CO2 16 (L) 11/15/2018 1046    GLUCOSE 81 11/15/2018 1046   GLUCOSE 87 07/04/2017 0830   BUN 9 11/15/2018 1046   CREATININE 0.65 11/15/2018 1046   CREATININE 0.65 03/08/2013 1706   CALCIUM 10.3 (H) 11/15/2018 1046   GFRNONAA 115 11/15/2018 1046   GFRAA 132 11/15/2018 1046   Lab Results  Component Value Date   HGBA1C 5.2 11/15/2018   HGBA1C 5.3 08/21/2015   Lab Results  Component Value Date   INSULIN 8.5 11/15/2018   CBC    Component Value Date/Time   WBC 4.8 11/15/2018 1046   WBC 5.0 07/04/2017 0830   RBC 4.70 11/15/2018 1046   RBC 4.17 07/04/2017 0830   HGB 13.9 11/15/2018 1046   HCT 43.3 11/15/2018 1046   PLT 275 11/16/2017   MCV 92 11/15/2018 1046   MCH 29.6 11/15/2018 1046   MCH 29.8 10/30/2016 0415   MCHC  32.1 11/15/2018 1046   MCHC 33.0 07/04/2017 0830   RDW 13.3 11/15/2018 1046   LYMPHSABS 2.0 11/15/2018 1046   MONOABS 0.4 07/04/2017 0830   EOSABS 0.1 11/15/2018 1046   BASOSABS 0.1 11/15/2018 1046   Iron/TIBC/Ferritin/ %Sat    Component Value Date/Time   FERRITIN 58.4 08/21/2015 1443   Lipid Panel     Component Value Date/Time   CHOL 192 11/15/2018 1046   TRIG 61 11/15/2018 1046   HDL 35 (L) 11/15/2018 1046   CHOLHDL 4 07/04/2017 0830   VLDL 13.8 07/04/2017 0830   LDLCALC 145 (H) 11/15/2018 1046   Hepatic Function Panel     Component Value Date/Time   PROT 7.6 11/15/2018 1046   ALBUMIN 4.6 11/15/2018 1046   AST 22 11/15/2018 1046   ALT 21 11/15/2018 1046   ALKPHOS 103 11/15/2018 1046   BILITOT 0.6 11/15/2018 1046   BILIDIR 0.1 07/04/2017 0830      Component Value Date/Time   TSH 0.878 11/15/2018 1046   TSH 1.48 11/16/2017   TSH 2.02 07/04/2017 0830   TSH 0.57 08/21/2015 1443   Results for JENNYLEE, UEHARA (MRN [id number]) as of 02/12/2019 07:55  Ref. Range 11/15/2018 10:46  Vitamin D, 25-Hydroxy Latest Ref Range: 30.0 - 100.0 ng/mL 5.0 (L)    I, Kirke Corin, CMA, am acting as Energy manager for [employer]. Manson Passey, DO  I have reviewed the above documentation for  accuracy and completeness, and I agree with the above. -Corinna Capra, DO

## 2019-03-04 ENCOUNTER — Ambulatory Visit (INDEPENDENT_AMBULATORY_CARE_PROVIDER_SITE_OTHER): Payer: Managed Care, Other (non HMO) | Admitting: Family Medicine

## 2019-03-04 ENCOUNTER — Encounter (INDEPENDENT_AMBULATORY_CARE_PROVIDER_SITE_OTHER): Payer: Self-pay | Admitting: Family Medicine

## 2019-03-04 ENCOUNTER — Other Ambulatory Visit: Payer: Self-pay

## 2019-03-04 DIAGNOSIS — E669 Obesity, unspecified: Secondary | ICD-10-CM | POA: Diagnosis not present

## 2019-03-04 DIAGNOSIS — Z6830 Body mass index (BMI) 30.0-30.9, adult: Secondary | ICD-10-CM | POA: Diagnosis not present

## 2019-03-04 DIAGNOSIS — E559 Vitamin D deficiency, unspecified: Secondary | ICD-10-CM

## 2019-03-04 MED ORDER — VITAMIN D (ERGOCALCIFEROL) 1.25 MG (50000 UNIT) PO CAPS: 50000.0000 [IU] | ORAL_CAPSULE | ORAL | 0 refills | Status: DC

## 2019-03-05 ENCOUNTER — Encounter (INDEPENDENT_AMBULATORY_CARE_PROVIDER_SITE_OTHER): Payer: Self-pay | Admitting: Family Medicine

## 2019-03-05 NOTE — Progress Notes (Signed)
Office: [phone]  /  Fax: [phone] TeleHealth Visit:  Audrey Stafford has verbally consented to this TeleHealth visit today. The patient is located at home, the provider is located at the [facility] office. The participants in this visit include the listed provider and patient. The visit was conducted today via Face Time.  HPI:   Chief Complaint: OBESITY Audrey Stafford is here to discuss her progress with her obesity treatment plan. She is keeping a food journal with 1400 to 1600 calories and 85+ grams of protein and is following her eating plan approximately 50 % of the time. She states she is exercising with YouTube videos and playing basketball 30 to 45 minutes 3 times per week. Audrey Stafford has done well maintaining her weight since her last visit. She had gained previously and notes that she has increased boredom eating. Audrey Stafford is also getting less support from her family.  We were unable to weigh the patient today for this TeleHealth visit. She feels as if she has maintained weight since her last visit. She has lost 6 lbs since starting treatment with [place].  Vitamin D Deficiency Audrey Stafford has a diagnosis of vitamin D deficiency. She is currently stable on vit D. Audrey Stafford denies nausea, vomiting, or muscle weakness.  ASSESSMENT AND PLAN:  Vitamin D deficiency - Plan: Vitamin D, Ergocalciferol, (DRISDOL) 1.25 MG (50000 UT) CAPS capsule  Class 1 obesity with serious comorbidity and body mass index (BMI) of 30.0 to 30.9 in adult, unspecified obesity type  PLAN:  Vitamin D Deficiency Audrey Stafford was informed that low vitamin D levels contribute to fatigue and are associated with obesity, breast, and colon cancer. Audrey Stafford agrees to continue to take prescription Vit D ,000 IU every week #4 with no refills and will follow up for routine testing of vitamin D, at least 2-3 times per year. She was informed of the risk of over-replacement of vitamin D and agrees to not increase her dose  unless she discusses this with [place] first. Audrey Stafford agrees to follow up in 2 weeks as directed.  Obesity [name] is currently in the action stage of change. As such, her goal is to continue with weight loss efforts. She has agreed to keep a food journal with 1400 to 1600 calories and 85+ grams of protein or to change back to the Category 3 plan. Audrey Stafford has been instructed to work up to a goal of 150 minutes of combined cardio and strengthening exercise per week for weight loss and overall health benefits. We discussed the following Behavioral Modification Strategies today: dealing with family or coworker sabotage, better snacking choices, and ways to avoid boredom eating.  Audrey Stafford has agreed to follow up with our clinic in 2 weeks. She was informed of the importance of frequent follow up visits to maximize her success with intensive lifestyle modifications for her multiple health conditions.  ALLERGIES: Allergies  Allergen Reactions  . Milk-Related Compounds Swelling    MEDICATIONS: Current Outpatient Medications on File Prior to Visit  Medication Sig Dispense Refill  . atenolol (TENORMIN) 25 MG tablet Take 25 mg by mouth daily.    . baclofen (LIORESAL) 10 MG tablet Take 1-2 tablets by mouth three times daily as needed    . naproxen sodium (ANAPROX DS) 550 MG tablet Take 1 tablet (550 mg total) by mouth 2 (two) times daily with a meal. 30 tablet 2   No current facility-administered medications on file prior to visit.     PAST MEDICAL HISTORY: Past Medical History:  Diagnosis Date  . Anemia   . Arthritis    Knees  . Back pain   . Chest pain   . Constipation   . Dysmenorrhea   . GERD (gastroesophageal reflux disease)   . Hypertension   . Joint pain   . Migraine without aura   . Multiple food allergies    Milk  . Palpitations     PAST SURGICAL HISTORY: Past Surgical History:  Procedure Laterality Date  . CESAREAN SECTION  01/11/2005    SOCIAL HISTORY: Social History    Tobacco Use  . Smoking status: Never Smoker  . Smokeless tobacco: Never Used  Substance Use Topics  . Alcohol use: No    Frequency: Never  . Drug use: No    FAMILY HISTORY: Family History  Problem Relation Age of Onset  . Sudden Cardiac Death Mother        MI  . Breast cancer Paternal Aunt        3  . Hypertension Maternal Grandmother   . Diabetes Maternal Grandmother   . Hypertension Maternal Aunt     ROS: Review of Systems  Gastrointestinal: Negative for nausea and vomiting.  Musculoskeletal:       Negative for muscle weakness.    PHYSICAL EXAM: Pt in no acute distress  RECENT LABS AND TESTS: BMET    Component Value Date/Time   NA 141 11/15/2018 1046   K 4.5 11/15/2018 1046   CL 105 11/15/2018 1046   CO2 16 (L) 11/15/2018 1046   GLUCOSE 81 11/15/2018 1046   GLUCOSE 87 07/04/2017 0830   BUN 9 11/15/2018 1046   CREATININE 0.65 11/15/2018 1046   CREATININE 0.65 03/08/2013 1706   CALCIUM 10.3 (H) 11/15/2018 1046   GFRNONAA 115 11/15/2018 1046   GFRAA 132 11/15/2018 1046   Lab Results  Component Value Date   HGBA1C 5.2 11/15/2018   HGBA1C 5.3 08/21/2015   Lab Results  Component Value Date   INSULIN 8.5 11/15/2018   CBC    Component Value Date/Time   WBC 4.8 11/15/2018 1046   WBC 5.0 07/04/2017 0830   RBC 4.70 11/15/2018 1046   RBC 4.17 07/04/2017 0830   HGB 13.9 11/15/2018 1046   HCT 43.3 11/15/2018 1046   PLT 275 11/16/2017   MCV 92 11/15/2018 1046   MCH 29.6 11/15/2018 1046   MCH 29.8 10/30/2016 0415   MCHC 32.1 11/15/2018 1046   MCHC 33.0 07/04/2017 0830   RDW 13.3 11/15/2018 1046   LYMPHSABS 2.0 11/15/2018 1046   MONOABS 0.4 07/04/2017 0830   EOSABS 0.1 11/15/2018 1046   BASOSABS 0.1 11/15/2018 1046   Iron/TIBC/Ferritin/ %Sat    Component Value Date/Time   FERRITIN 58.4 08/21/2015 1443   Lipid Panel     Component Value Date/Time   CHOL 192 11/15/2018 1046   TRIG 61 11/15/2018 1046   HDL 35 (L) 11/15/2018 1046   CHOLHDL 4  07/04/2017 0830   VLDL 13.8 07/04/2017 0830   LDLCALC 145 (H) 11/15/2018 1046   Hepatic Function Panel     Component Value Date/Time   PROT 7.6 11/15/2018 1046   ALBUMIN 4.6 11/15/2018 1046   AST 22 11/15/2018 1046   ALT 21 11/15/2018 1046   ALKPHOS 103 11/15/2018 1046   BILITOT 0.6 11/15/2018 1046   BILIDIR 0.1 07/04/2017 0830      Component Value Date/Time   TSH 0.878 11/15/2018 1046   TSH 1.48 11/16/2017   TSH 2.02 07/04/2017 0830   TSH 0.57 08/21/2015  1443    Results for Chinita GreenlandSTOVALL, Anjela (MRN [id number]) as of 03/05/2019 07:45  Ref. Range 11/15/2018 10:46  Vitamin D, 25-Hydroxy Latest Ref Range: 30.0 - 100.0 ng/mL 5.0 (L)    I, Kirke Corinara Soares, CMA, am acting as transcriptionist for Wilder Gladearen D. Rhyker Silversmith, MD I have reviewed the above documentation for accuracy and completeness, and I agree with the above. -Quillian Quincearen Kiven Vangilder, MD

## 2019-03-05 NOTE — Telephone Encounter (Signed)
Please advise 

## 2019-03-19 ENCOUNTER — Encounter (INDEPENDENT_AMBULATORY_CARE_PROVIDER_SITE_OTHER): Payer: Self-pay | Admitting: Family Medicine

## 2019-03-19 ENCOUNTER — Ambulatory Visit (INDEPENDENT_AMBULATORY_CARE_PROVIDER_SITE_OTHER): Payer: Managed Care, Other (non HMO) | Admitting: Family Medicine

## 2019-03-19 ENCOUNTER — Other Ambulatory Visit: Payer: Self-pay

## 2019-03-19 DIAGNOSIS — E559 Vitamin D deficiency, unspecified: Secondary | ICD-10-CM | POA: Diagnosis not present

## 2019-03-19 DIAGNOSIS — E669 Obesity, unspecified: Secondary | ICD-10-CM

## 2019-03-19 DIAGNOSIS — Z6830 Body mass index (BMI) 30.0-30.9, adult: Secondary | ICD-10-CM | POA: Diagnosis not present

## 2019-03-19 MED ORDER — VITAMIN D (ERGOCALCIFEROL) 1.25 MG (50000 UNIT) PO CAPS: 50000.0000 [IU] | ORAL_CAPSULE | ORAL | 0 refills | Status: DC

## 2019-03-20 NOTE — Progress Notes (Signed)
Office: [phone]  /  Fax: [phone] TeleHealth Visit:  Audrey Stafford has verbally consented to this TeleHealth visit today. The patient is located at home, the provider is located at the [facility] office. The participants in this visit include the listed provider and patient and any and all parties involved. The visit was conducted today via FaceTime.  HPI:   Chief Complaint: OBESITY Audrey Stafford is here to discuss her progress with her obesity treatment plan. She is on the Category 3 plan and is following her eating plan approximately 60 % of the time. She states she is doing Neurosurgeon 30 minutes 2 times per week. Gera states that she has been increasing exercise. She is struggling to follow her plan closely, with either journaling or the Category 3 plan, but she is still trying to be mindful about her food choices. We were unable to weigh the patient today for this TeleHealth visit. She feels as if she has gained weight since her last visit.   Vitamin D deficiency Audrey Stafford has a diagnosis of vitamin D deficiency. She is stable on vit D and denies nausea, vomiting or muscle weakness.  ASSESSMENT AND PLAN:  Vitamin D deficiency - Plan: Vitamin D, Ergocalciferol, (DRISDOL) 1.25 MG (50000 UT) CAPS capsule  Class 1 obesity with serious comorbidity and body mass index (BMI) of 30.0 to 30.9 in adult, unspecified obesity type  PLAN:  Vitamin D Deficiency Audrey Stafford was informed that low vitamin D levels contributes to fatigue and are associated with obesity, breast, and colon cancer. She agrees to continue to take prescription Vit D @50 ,000 IU every week #4 with no refills and will follow up for routine testing of vitamin D, at least 2-3 times per year. She was informed of the risk of over-replacement of vitamin D and agrees to not increase her dose unless she discusses this with [place] first. Audrey Stafford agrees to follow up as directed.  Obesity Audrey Stafford is  currently in the action stage of change. As such, her goal is to maintain weight for now and we will reassess her readiness to change at her follow up visit in 2 weeks She has agreed to portion control better and make smarter food choices, such as increase vegetables and decrease simple carbohydrates and follow the Category 3 plan Audrey Stafford has been instructed to work up to a goal of 150 minutes of combined cardio and strengthening exercise per week for weight loss and overall health benefits. We discussed the following Behavioral Modification Strategies today: better snacking choices, increasing lean protein intake, decreasing simple carbohydrates and ways to avoid boredom eating  Audrey Stafford has agreed to follow up with our clinic in 2 weeks. She was informed of the importance of frequent follow up visits to maximize her success with intensive lifestyle modifications for her multiple health conditions.  ALLERGIES: Allergies  Allergen Reactions  . Milk-Related Compounds Swelling    MEDICATIONS: Current Outpatient Medications on File Prior to Visit  Medication Sig Dispense Refill  . atenolol (TENORMIN) 25 MG tablet Take 25 mg by mouth daily.    . baclofen (LIORESAL) 10 MG tablet Take 1-2 tablets by mouth three times daily as needed    . naproxen sodium (ANAPROX DS) 550 MG tablet Take 1 tablet (550 mg total) by mouth 2 (two) times daily with a meal. 30 tablet 2   No current facility-administered medications on file prior to visit.     PAST MEDICAL HISTORY: Past Medical History:  Diagnosis Date  .  Anemia   . Arthritis    Knees  . Back pain   . Chest pain   . Constipation   . Dysmenorrhea   . GERD (gastroesophageal reflux disease)   . Hypertension   . Joint pain   . Migraine without aura   . Multiple food allergies    Milk  . Palpitations     PAST SURGICAL HISTORY: Past Surgical History:  Procedure Laterality Date  . CESAREAN SECTION  01/11/2005    SOCIAL HISTORY: Social  History   Tobacco Use  . Smoking status: Never Smoker  . Smokeless tobacco: Never Used  Substance Use Topics  . Alcohol use: No    Frequency: Never  . Drug use: No    FAMILY HISTORY: Family History  Problem Relation Age of Onset  . Sudden Cardiac Death Mother        MI  . Breast cancer Paternal Aunt        3  . Hypertension Maternal Grandmother   . Diabetes Maternal Grandmother   . Hypertension Maternal Aunt     ROS: Review of Systems  Constitutional: Negative for weight loss.  Gastrointestinal: Negative for nausea and vomiting.  Musculoskeletal:       Negative for muscle weakness    PHYSICAL EXAM: Pt in no acute distress  RECENT LABS AND TESTS: BMET    Component Value Date/Time   NA 141 11/15/2018 1046   K 4.5 11/15/2018 1046   CL 105 11/15/2018 1046   CO2 16 (L) 11/15/2018 1046   GLUCOSE 81 11/15/2018 1046   GLUCOSE 87 07/04/2017 0830   BUN 9 11/15/2018 1046   CREATININE 0.65 11/15/2018 1046   CREATININE 0.65 03/08/2013 1706   CALCIUM 10.3 (H) 11/15/2018 1046   GFRNONAA 115 11/15/2018 1046   GFRAA 132 11/15/2018 1046   Lab Results  Component Value Date   HGBA1C 5.2 11/15/2018   HGBA1C 5.3 08/21/2015   Lab Results  Component Value Date   INSULIN 8.5 11/15/2018   CBC    Component Value Date/Time   WBC 4.8 11/15/2018 1046   WBC 5.0 07/04/2017 0830   RBC 4.70 11/15/2018 1046   RBC 4.17 07/04/2017 0830   HGB 13.9 11/15/2018 1046   HCT 43.3 11/15/2018 1046   PLT 275 11/16/2017   MCV 92 11/15/2018 1046   MCH 29.6 11/15/2018 1046   MCH 29.8 10/30/2016 0415   MCHC 32.1 11/15/2018 1046   MCHC 33.0 07/04/2017 0830   RDW 13.3 11/15/2018 1046   LYMPHSABS 2.0 11/15/2018 1046   MONOABS 0.4 07/04/2017 0830   EOSABS 0.1 11/15/2018 1046   BASOSABS 0.1 11/15/2018 1046   Iron/TIBC/Ferritin/ %Sat    Component Value Date/Time   FERRITIN 58.4 08/21/2015 1443   Lipid Panel     Component Value Date/Time   CHOL 192 11/15/2018 1046   TRIG 61 11/15/2018  1046   HDL 35 (L) 11/15/2018 1046   CHOLHDL 4 07/04/2017 0830   VLDL 13.8 07/04/2017 0830   LDLCALC 145 (H) 11/15/2018 1046   Hepatic Function Panel     Component Value Date/Time   PROT 7.6 11/15/2018 1046   ALBUMIN 4.6 11/15/2018 1046   AST 22 11/15/2018 1046   ALT 21 11/15/2018 1046   ALKPHOS 103 11/15/2018 1046   BILITOT 0.6 11/15/2018 1046   BILIDIR 0.1 07/04/2017 0830      Component Value Date/Time   TSH 0.878 11/15/2018 1046   TSH 1.48 11/16/2017   TSH 2.02 07/04/2017 0830   TSH  0.57 08/21/2015 1443     Ref. Range 11/15/2018 10:46  Vitamin D, 25-Hydroxy Latest Ref Range: 30.0 - 100.0 ng/mL 5.0 (L)    I, [place] CraneJoanne Murray, am acting as transcriptionist for Quillian Quincearen Adithi Gammon, MD I have reviewed the above documentation for accuracy and completeness, and I agree with the above. -Quillian Quincearen Astha Probasco, MD

## 2019-04-01 ENCOUNTER — Encounter (INDEPENDENT_AMBULATORY_CARE_PROVIDER_SITE_OTHER): Payer: Self-pay | Admitting: Family Medicine

## 2019-04-01 ENCOUNTER — Ambulatory Visit (INDEPENDENT_AMBULATORY_CARE_PROVIDER_SITE_OTHER): Payer: Managed Care, Other (non HMO) | Admitting: Family Medicine

## 2019-04-01 ENCOUNTER — Other Ambulatory Visit: Payer: Self-pay

## 2019-04-01 DIAGNOSIS — E669 Obesity, unspecified: Secondary | ICD-10-CM | POA: Diagnosis not present

## 2019-04-01 DIAGNOSIS — Z6830 Body mass index (BMI) 30.0-30.9, adult: Secondary | ICD-10-CM | POA: Diagnosis not present

## 2019-04-01 DIAGNOSIS — E559 Vitamin D deficiency, unspecified: Secondary | ICD-10-CM

## 2019-04-02 MED ORDER — VITAMIN D (ERGOCALCIFEROL) 1.25 MG (50000 UNIT) PO CAPS: 50000.0000 [IU] | ORAL_CAPSULE | ORAL | 0 refills | Status: DC

## 2019-04-02 NOTE — Progress Notes (Signed)
Office: [phone]  /  Fax: [phone] TeleHealth Visit:  Audrey GreenlandChanika Stafford has verbally consented to this TeleHealth visit today. The patient is located at home, the provider is located at the [facility] office. The participants in this visit include the listed provider and patient and any and all parties involved. The visit was conducted today via FaceTime.  HPI:   Chief Complaint: OBESITY Audrey Stafford is here to discuss her progress with her obesity treatment plan. She is on the Category 3 plan and is following her eating plan approximately 75 % of the time. She states she is exercising to exercise video's 20 to 30 minutes 2 times per week. Audrey Stafford has been struggling a bit more to stay on track with her eating plan. She is up 2 pounds and she is frustrated. Audrey Stafford is trying to exercise 2 times a week for 20 to 30 minutes with exercise video's. She has been feeling a bit more bloated recently. We were unable to weigh the patient today for this TeleHealth visit. She feels as if she has gained weight since her last visit.  Vitamin D deficiency Audrey Stafford has a diagnosis of vitamin D deficiency. She is stable on vit D and denies nausea, vomiting or muscle weakness.  ASSESSMENT AND PLAN:  Vitamin D deficiency - Plan: Vitamin D, Ergocalciferol, (DRISDOL) 1.25 MG (50000 UT) CAPS capsule  Class 1 obesity with serious comorbidity and body mass index (BMI) of 30.0 to 30.9 in adult, unspecified obesity type  PLAN:  Vitamin D Deficiency Audrey Stafford was informed that low vitamin D levels contributes to fatigue and are associated with obesity, breast, and colon cancer. She agrees to continue to take prescription Vit D @50 ,000 IU every week #4 with no refills and will follow up for routine testing of vitamin D, at least 2-3 times per year. She was informed of the risk of over-replacement of vitamin D and agrees to not increase her dose unless she discusses this with Audrey Stafford first. Audrey Stafford agrees to  follow up as directed.  Obesity Audrey Stafford is currently in the action stage of change. As such, her goal is to continue with weight loss efforts She has agreed to follow the Category 3 plan Audrey Stafford has been instructed to work up to a goal of 150 minutes of combined cardio and strengthening exercise per week for weight loss and overall health benefits. We discussed the following Behavioral Modification Strategies today: increase H2O intake, decreasing sodium intake and work on meal planning and easy cooking plans  Audrey Stafford was advised that 2 pounds could be some fluid and to not get too worried. She will continue to stay with her Category 3 plan. She will increase her water intake and decrease her sodium intake for now and we will follow.  Audrey Stafford has agreed to follow up with our clinic in 2 to 3 weeks. She was informed of the importance of frequent follow up visits to maximize her success with intensive lifestyle modifications for her multiple health conditions.  ALLERGIES: Allergies  Allergen Reactions  . Milk-Related Compounds Swelling    MEDICATIONS: Current Outpatient Medications on File Prior to Visit  Medication Sig Dispense Refill  . atenolol (TENORMIN) 25 MG tablet Take 25 mg by mouth daily.    . baclofen (LIORESAL) 10 MG tablet Take 1-2 tablets by mouth three times daily as needed    . naproxen sodium (ANAPROX DS) 550 MG tablet Take 1 tablet (550 mg total) by mouth 2 (two) times daily with a meal. 30  tablet 2   No current facility-administered medications on file prior to visit.     PAST MEDICAL HISTORY: Past Medical History:  Diagnosis Date  . Anemia   . Arthritis    Knees  . Back pain   . Chest pain   . Constipation   . Dysmenorrhea   . GERD (gastroesophageal reflux disease)   . Hypertension   . Joint pain   . Migraine without aura   . Multiple food allergies    Milk  . Palpitations     PAST SURGICAL HISTORY: Past Surgical History:  Procedure Laterality Date   . CESAREAN SECTION  01/11/2005    SOCIAL HISTORY: Social History   Tobacco Use  . Smoking status: Never Smoker  . Smokeless tobacco: Never Used  Substance Use Topics  . Alcohol use: No    Frequency: Never  . Drug use: No    FAMILY HISTORY: Family History  Problem Relation Age of Onset  . Sudden Cardiac Death Mother        MI  . Breast cancer Paternal Aunt        3  . Hypertension Maternal Grandmother   . Diabetes Maternal Grandmother   . Hypertension Maternal Aunt     ROS: Review of Systems  Constitutional: Negative for weight loss.  Gastrointestinal: Negative for nausea and vomiting.  Musculoskeletal:       Negative for muscle weakness    PHYSICAL EXAM: Pt in no acute distress  RECENT LABS AND TESTS: BMET    Component Value Date/Time   NA 141 11/15/2018 1046   K 4.5 11/15/2018 1046   CL 105 11/15/2018 1046   CO2 16 (L) 11/15/2018 1046   GLUCOSE 81 11/15/2018 1046   GLUCOSE 87 07/04/2017 0830   BUN 9 11/15/2018 1046   CREATININE 0.65 11/15/2018 1046   CREATININE 0.65 03/08/2013 1706   CALCIUM 10.3 (H) 11/15/2018 1046   GFRNONAA 115 11/15/2018 1046   GFRAA 132 11/15/2018 1046   Lab Results  Component Value Date   HGBA1C 5.2 11/15/2018   HGBA1C 5.3 08/21/2015   Lab Results  Component Value Date   INSULIN 8.5 11/15/2018   CBC    Component Value Date/Time   WBC 4.8 11/15/2018 1046   WBC 5.0 07/04/2017 0830   RBC 4.70 11/15/2018 1046   RBC 4.17 07/04/2017 0830   HGB 13.9 11/15/2018 1046   HCT 43.3 11/15/2018 1046   PLT 275 11/16/2017   MCV 92 11/15/2018 1046   MCH 29.6 11/15/2018 1046   MCH 29.8 10/30/2016 0415   MCHC 32.1 11/15/2018 1046   MCHC 33.0 07/04/2017 0830   RDW 13.3 11/15/2018 1046   LYMPHSABS 2.0 11/15/2018 1046   MONOABS 0.4 07/04/2017 0830   EOSABS 0.1 11/15/2018 1046   BASOSABS 0.1 11/15/2018 1046   Iron/TIBC/Ferritin/ %Sat    Component Value Date/Time   FERRITIN 58.4 08/21/2015 1443   Lipid Panel     Component  Value Date/Time   CHOL 192 11/15/2018 1046   TRIG 61 11/15/2018 1046   HDL 35 (L) 11/15/2018 1046   CHOLHDL 4 07/04/2017 0830   VLDL 13.8 07/04/2017 0830   LDLCALC 145 (H) 11/15/2018 1046   Hepatic Function Panel     Component Value Date/Time   PROT 7.6 11/15/2018 1046   ALBUMIN 4.6 11/15/2018 1046   AST 22 11/15/2018 1046   ALT 21 11/15/2018 1046   ALKPHOS 103 11/15/2018 1046   BILITOT 0.6 11/15/2018 1046   BILIDIR 0.1 07/04/2017 0830  Component Value Date/Time   TSH 0.878 11/15/2018 1046   TSH 1.48 11/16/2017   TSH 2.02 07/04/2017 0830   TSH 0.57 08/21/2015 1443     Ref. Range 11/15/2018 10:46  Vitamin D, 25-Hydroxy Latest Ref Range: 30.0 - 100.0 ng/mL 5.0 (L)    I, [place] CraneJoanne Murray, am acting as transcriptionist for Quillian Quincearen Tovia Kisner, MD I have reviewed the above documentation for accuracy and completeness, and I agree with the above. -Quillian Quincearen Maat Kafer, MD

## 2019-04-09 DIAGNOSIS — E559 Vitamin D deficiency, unspecified: Secondary | ICD-10-CM | POA: Insufficient documentation

## 2019-04-15 ENCOUNTER — Ambulatory Visit (INDEPENDENT_AMBULATORY_CARE_PROVIDER_SITE_OTHER): Payer: Managed Care, Other (non HMO) | Admitting: Family Medicine

## 2019-04-15 ENCOUNTER — Encounter (INDEPENDENT_AMBULATORY_CARE_PROVIDER_SITE_OTHER): Payer: Self-pay | Admitting: Family Medicine

## 2019-04-15 ENCOUNTER — Other Ambulatory Visit: Payer: Self-pay

## 2019-04-15 DIAGNOSIS — E669 Obesity, unspecified: Secondary | ICD-10-CM | POA: Diagnosis not present

## 2019-04-15 DIAGNOSIS — F3289 Other specified depressive episodes: Secondary | ICD-10-CM

## 2019-04-15 DIAGNOSIS — I10 Essential (primary) hypertension: Secondary | ICD-10-CM

## 2019-04-15 DIAGNOSIS — Z6830 Body mass index (BMI) 30.0-30.9, adult: Secondary | ICD-10-CM

## 2019-04-16 MED ORDER — BUPROPION HCL ER (SR) 150 MG PO TB12: 150.0000 mg | ORAL_TABLET | Freq: Every day | ORAL | 0 refills | Status: DC
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
## 2019-05-02 ENCOUNTER — Encounter (INDEPENDENT_AMBULATORY_CARE_PROVIDER_SITE_OTHER): Payer: Self-pay | Admitting: Family Medicine

## 2019-05-02 ENCOUNTER — Telehealth (INDEPENDENT_AMBULATORY_CARE_PROVIDER_SITE_OTHER): Payer: Managed Care, Other (non HMO) | Admitting: Family Medicine

## 2019-05-02 ENCOUNTER — Other Ambulatory Visit: Payer: Self-pay

## 2019-05-02 DIAGNOSIS — E669 Obesity, unspecified: Secondary | ICD-10-CM | POA: Diagnosis not present

## 2019-05-02 DIAGNOSIS — F3289 Other specified depressive episodes: Secondary | ICD-10-CM | POA: Diagnosis not present

## 2019-05-02 DIAGNOSIS — E559 Vitamin D deficiency, unspecified: Secondary | ICD-10-CM | POA: Diagnosis not present

## 2019-05-02 DIAGNOSIS — Z6830 Body mass index (BMI) 30.0-30.9, adult: Secondary | ICD-10-CM

## 2019-05-02 DIAGNOSIS — Z9189 Other specified personal risk factors, not elsewhere classified: Secondary | ICD-10-CM | POA: Diagnosis not present

## 2019-05-02 MED ORDER — VITAMIN D (ERGOCALCIFEROL) 1.25 MG (50000 UNIT) PO CAPS: 50000.0000 [IU] | ORAL_CAPSULE | ORAL | 0 refills | Status: DC

## 2019-05-02 MED ORDER — BUPROPION HCL ER (SR) 150 MG PO TB12: 150.0000 mg | ORAL_TABLET | Freq: Every day | ORAL | 0 refills | Status: DC

## 2019-05-06 ENCOUNTER — Encounter: Payer: Self-pay | Admitting: Adult Health

## 2019-05-07 ENCOUNTER — Encounter: Payer: Self-pay | Admitting: Adult Health

## 2019-05-07 ENCOUNTER — Ambulatory Visit (INDEPENDENT_AMBULATORY_CARE_PROVIDER_SITE_OTHER): Payer: Self-pay | Admitting: Adult Health

## 2019-05-07 ENCOUNTER — Telehealth: Payer: Self-pay | Admitting: Family Medicine

## 2019-05-07 ENCOUNTER — Other Ambulatory Visit: Payer: Self-pay

## 2019-05-07 DIAGNOSIS — Z91018 Allergy to other foods: Secondary | ICD-10-CM

## 2019-05-07 MED ORDER — PREDNISONE 50 MG PO TABS: 50.0000 mg | ORAL_TABLET | Freq: Every day | ORAL | 0 refills | Status: DC | PRN

## 2019-05-07 NOTE — Progress Notes (Signed)
Virtual Visit via Video Note  I connected with Rikita Denes  on 05/07/19 at  1:00 PM EDT by a video enabled telemedicine application and verified that I am speaking with the correct person using two identifiers.  Location patient: home Location provider:work or home office Persons participating in the virtual visit: patient, provider  I discussed the limitations of evaluation and management by telemedicine and the availability of in person appointments. The patient expressed understanding and agreed to proceed.   HPI: 38 year old female who is being evaluated today for possible food allergy.  In the past she has had issues with tingling and swelling of the tongue, this was thought to be related to food allergies and she was seen by an allergist.  Her allergy testing came back negative and she was advised to use Zyrtec on a daily basis.  She had been using Zyrtec as directed but this caused headaches and she was told by her headache doctor to stop Zyrtec.  Her headaches resolved and she was doing well with food allergy symptoms but over the last week she has had multiple instances of a tingling sensation and swelling of her tongue.  This happened she will take a Benadryl and the swelling resolves.  She has no idea which food(s) is causing her reaction. Currently she has no symptoms   She denies shortness of breath or difficulty swallowing  She has an epi pen but has not had to use it    ROS: See pertinent positives and negatives per HPI.  Past Medical History:  Diagnosis Date  . Anemia   . Arthritis    Knees  . Back pain   . Chest pain   . Constipation   . Dysmenorrhea   . GERD (gastroesophageal reflux disease)   . Hypertension   . Joint pain   . Migraine without aura   . Multiple food allergies    Milk  . Palpitations     Past Surgical History:  Procedure Laterality Date  . CESAREAN SECTION  01/11/2005    Family History  Problem Relation Age of Onset  . Sudden Cardiac  Death Mother        MI  . Breast cancer Paternal Aunt        3  . Hypertension Maternal Grandmother   . Diabetes Maternal Grandmother   . Hypertension Maternal Aunt       Current Outpatient Medications:  .  atenolol (TENORMIN) 25 MG tablet, Take 25 mg by mouth daily., Disp: , Rfl:  .  baclofen (LIORESAL) 10 MG tablet, Take 1-2 tablets by mouth three times daily as needed, Disp: , Rfl:  .  buPROPion (WELLBUTRIN SR) 150 MG 12 hr tablet, Take 1 tablet (150 mg total) by mouth daily., Disp: 30 tablet, Rfl: 0 .  naproxen sodium (ANAPROX DS) 550 MG tablet, Take 1 tablet (550 mg total) by mouth 2 (two) times daily with a meal., Disp: 30 tablet, Rfl: 2 .  Vitamin D, Ergocalciferol, (DRISDOL) 1.25 MG (50000 UT) CAPS capsule, Take 1 capsule (50,000 Units total) by mouth every 7 (seven) days., Disp: 4 capsule, Rfl: 0  EXAM:  VITALS per patient if applicable:  GENERAL: alert, oriented, appears well and in no acute distress  HEENT: atraumatic, conjunttiva clear, no obvious abnormalities on inspection of external nose and ears  NECK: normal movements of the head and neck  LUNGS: on inspection no signs of respiratory distress, breathing rate appears normal, no obvious gross SOB, gasping or wheezing  CV:  no obvious cyanosis  MS: moves all visible extremities without noticeable abnormality  PSYCH/NEURO: pleasant and cooperative, no obvious depression or anxiety, speech and thought processing grossly intact  ASSESSMENT AND PLAN:  Discussed the following assessment and plan:  1. Food allergy -Unknown trigger at this time.  Do not think it is medication related.  Will send in prednisone to keep on hand in case she has a food allergy reaction again.  Advised to try switching to Claritin to see if this causes less of a headache.  Can always follow-up with allergist as well. - predniSONE (DELTASONE) 50 MG tablet; Take 1 tablet (50 mg total) by mouth daily as needed. Take if needed for food allergy   Dispense: 30 tablet; Refill: 0     I discussed the assessment and treatment plan with the patient. The patient was provided an opportunity to ask questions and all were answered. The patient agreed with the plan and demonstrated an understanding of the instructions.   The patient was advised to call back or seek an in-person evaluation if the symptoms worsen or if the condition fails to improve as anticipated.   Dorothyann Peng, NP

## 2019-05-07 NOTE — Telephone Encounter (Signed)
Spoke to the pt.  She informed me that she seen [facility] around June of last year.  Dr. Harold Hedge did lab work and all came back normal.  No allergies noted.  She has continued to follow his advise and take Zyrtec daily.  Takes Benadryl PRN and also has an epi pen.  She reports that she continues to have tongue swelling.  This happened a few days ago when she had milk and cereal.  She also reports her throat feels funny sometimes and she may feel drainage.  Her skin will itch.  She has seen dermatology but was nothing they could do.  At this time she denied having any SOB, trouble breathing, swollen tongue.  Scheduled for virtual appt today at 1 PM.  Will forward to [facility] as [place].

## 2019-05-07 NOTE — Telephone Encounter (Signed)
Copied from [place] [id number]. Topic: Appointment Scheduling - Scheduling Inquiry for Clinic >> May 07, 2019  8:29 AM Lennox Solders wrote: Reason for CRM: pt is calling to sch an appt for tongue feeling funny. Pt received my chart message from misty. Called office 3x no answer

## 2019-05-07 NOTE — Progress Notes (Signed)
Office: [phone]  /  Fax: [phone] TeleHealth Visit:  [name] Basilio has verbally consented to this TeleHealth visit today. The patient is located at home, the provider is located at the [facility] office. The participants in this visit include the listed provider and patient. The visit was conducted today via face time.  HPI:   Chief Complaint: OBESITY Marlaine HindChanika is here to discuss her progress with her obesity treatment plan. She is on the Category 3 plan and is following her eating plan approximately 75 % of the time. She states she is walking and playing basketball for 30 minutes 1 time per week. Shayne continue to do well on her Category 3 plan. She feels her clothes are a bit looser since over last visit, but isn't weighing herself at home. She is doing better with meal planning and her kids are starting to eat healthier as well.  We were unable to weigh the patient today for this TeleHealth visit. She feels as if she has 75 weight since her last visit. She has lost 3 lbs since starting treatment with us.  Vitamin D Deficiency Adriona has a diagnosis of vitamin D deficiency. She is stable on prescription Vit D, but level is not yet at goal. She denies nausea, vomiting or muscle weakness.  At risk for osteopenia and osteoporosis Aniko is at higher risk of osteopenia and osteoporosis due to vitamin D deficiency.   Depression with emotional eating behaviors Terianna's mood is stable on Wellbutrin. She notes decreased irritability, she is sleeping well, and denies palpitations. Neomi struggles with emotional eating and using food for comfort to the extent that it is negatively impacting her health. She often snacks when she is not hungry. Marquetta sometimes feels she is out of control and then feels guilty that she made poor food choices. She has been working on behavior modification techniques to help reduce her emotional eating and has been somewhat successful. She  shows no sign of suicidal or homicidal ideations.  [facility] 11/15/2018 08/21/2015  Decreased Interest 1 0  Down, Depressed, Hopeless 1 0  PHQ - 2 Score 2 0  Altered sleeping 2 -  Tired, decreased energy 3 -  Change in appetite 3 -  Feeling bad or failure about yourself  1 -  Trouble concentrating 2 -  Moving slowly or fidgety/restless 0 -  Suicidal thoughts 0 -  PHQ-9 Score 13 -  Difficult doing work/chores Not difficult at all -    ASSESSMENT AND PLAN:  Vitamin D deficiency - Plan: Vitamin D, Ergocalciferol, (DRISDOL) 1.25 MG (50000 UT) CAPS capsule  Other depression - with emotional eating - Plan: buPROPion (WELLBUTRIN SR) 150 MG 12 hr tablet  At risk for osteoporosis  Class 1 obesity with serious comorbidity and body mass index (BMI) of 30.0 to 30.9 in adult, unspecified obesity type  PLAN:  Vitamin D Deficiency Ailene was informed that low vitamin D levels contributes to fatigue and are associated with obesity, breast, and colon cancer. Yamilette agrees to continue taking prescription Vit D 50,000 IU every week #4 and we will refill for 1 month. She will follow up for routine testing of vitamin D, at least 2-3 times per year. She was informed of the risk of over-replacement of vitamin D and agrees to not increase her dose unless she discusses this with us first. We will recheck labs at her next in office visit. Oluwanifemi agrees to follow up with our clinic in 2 to 3  weeks.  At risk for osteopenia and osteoporosis Joya was given extended (15 minutes) osteoporosis prevention counseling today. Clarise is at risk for osteopenia and osteoporsis due to her vitamin D deficiency. She was encouraged to take her vitamin D and follow her higher calcium diet and increase strengthening exercise to help strengthen her bones and decrease her risk of osteopenia and osteoporosis.  Depression with Emotional Eating Behaviors We discussed behavior modification techniques today to  help Neal deal with her emotional eating and depression. Milagros agrees to continue taking Wellbutrin SR 150 mg q daily #30 and we will refill for 1 month. Derricka agrees to follow up with our clinic in 2 to 3 weeks.  Obesity Katheleen is currently in the action stage of change. As such, her goal is to continue with weight loss efforts She has agreed to follow the Category 3 plan Verleen has been instructed to work up to a goal of 150 minutes of combined cardio and strengthening exercise per week for weight loss and overall health benefits. We discussed the following Behavioral Modification Strategies today: work on meal planning and easy cooking plans, dealing with family or coworker sabotage, ways to avoid boredom eating, and better snacking choices   Diamon has agreed to follow up with our clinic in 2 to 3 weeks. She was informed of the importance of frequent follow up visits to maximize her success with intensive lifestyle modifications for her multiple health conditions.  ALLERGIES: Allergies  Allergen Reactions   Milk-Related Compounds Swelling    MEDICATIONS: Current Outpatient Medications on File Prior to Visit  Medication Sig Dispense Refill   atenolol (TENORMIN) 25 MG tablet Take 25 mg by mouth daily.     baclofen (LIORESAL) 10 MG tablet Take 1-2 tablets by mouth three times daily as needed     naproxen sodium (ANAPROX DS) 550 MG tablet Take 1 tablet (550 mg total) by mouth 2 (two) times daily with a meal. 30 tablet 2   No current facility-administered medications on file prior to visit.     PAST MEDICAL HISTORY: Past Medical History:  Diagnosis Date   Anemia    Arthritis    Knees   Back pain    Chest pain    Constipation    Dysmenorrhea    GERD (gastroesophageal reflux disease)    Hypertension    Joint pain    Migraine without aura    Multiple food allergies    Milk   Palpitations     PAST SURGICAL HISTORY: Past Surgical History:    Procedure Laterality Date   CESAREAN SECTION  01/11/2005    SOCIAL HISTORY: Social History   Tobacco Use   Smoking status: Never Smoker   Smokeless tobacco: Never Used  Substance Use Topics   Alcohol use: No    Frequency: Never   Drug use: No    FAMILY HISTORY: Family History  Problem Relation Age of Onset   Sudden Cardiac Death Mother        MI   Breast cancer Paternal Aunt        3   Hypertension Maternal Grandmother    Diabetes Maternal Grandmother    Hypertension Maternal Aunt     ROS: Review of Systems  Constitutional: Negative for weight loss.  Cardiovascular: Negative for palpitations.  Gastrointestinal: Negative for nausea and vomiting.  Musculoskeletal:       Negative muscle weakness  Psychiatric/Behavioral: Positive for depression. Negative for suicidal ideas.    PHYSICAL EXAM: Pt in no  acute distress  RECENT LABS AND TESTS: BMET    Component Value Date/Time   NA 141 11/15/2018 1046   K 4.5 11/15/2018 1046   CL 105 11/15/2018 1046   CO2 16 (L) 11/15/2018 1046   GLUCOSE 81 11/15/2018 1046   GLUCOSE 87 07/04/2017 0830   BUN 9 11/15/2018 1046   CREATININE 0.65 11/15/2018 1046   CREATININE 0.65 03/08/2013 1706   CALCIUM 10.3 (H) 11/15/2018 1046   GFRNONAA 115 11/15/2018 1046   GFRAA 132 11/15/2018 1046   Lab Results  Component Value Date   HGBA1C 5.2 11/15/2018   HGBA1C 5.3 08/21/2015   Lab Results  Component Value Date   INSULIN 8.5 11/15/2018   CBC    Component Value Date/Time   WBC 4.8 11/15/2018 1046   WBC 5.0 07/04/2017 0830   RBC 4.70 11/15/2018 1046   RBC 4.17 07/04/2017 0830   HGB 13.9 11/15/2018 1046   HCT 43.3 11/15/2018 1046   PLT 275 11/16/2017   MCV 92 11/15/2018 1046   MCH 29.6 11/15/2018 1046   MCH 29.8 10/30/2016 0415   MCHC 32.1 11/15/2018 1046   MCHC 33.0 07/04/2017 0830   RDW 13.3 11/15/2018 1046   LYMPHSABS 2.0 11/15/2018 1046   MONOABS 0.4 07/04/2017 0830   EOSABS 0.1 11/15/2018 1046   BASOSABS  0.1 11/15/2018 1046   Iron/TIBC/Ferritin/ %Sat    Component Value Date/Time   FERRITIN 58.4 08/21/2015 1443   Lipid Panel     Component Value Date/Time   CHOL 192 11/15/2018 1046   TRIG 61 11/15/2018 1046   HDL 35 (L) 11/15/2018 1046   CHOLHDL 4 07/04/2017 0830   VLDL 13.8 07/04/2017 0830   LDLCALC 145 (H) 11/15/2018 1046   Hepatic Function Panel     Component Value Date/Time   PROT 7.6 11/15/2018 1046   ALBUMIN 4.6 11/15/2018 1046   AST 22 11/15/2018 1046   ALT 21 11/15/2018 1046   ALKPHOS 103 11/15/2018 1046   BILITOT 0.6 11/15/2018 1046   BILIDIR 0.1 07/04/2017 0830      Component Value Date/Time   TSH 0.878 11/15/2018 1046   TSH 1.48 11/16/2017   TSH 2.02 07/04/2017 0830   TSH 0.57 08/21/2015 1443      I, Burt KnackSharon Martin, am acting as Energy managertranscriptionist for Quillian Quincearen Shaley Leavens, MD I have reviewed the above documentation for accuracy and completeness, and I agree with the above. -Quillian Quincearen Foye Damron, MD

## 2019-05-20 ENCOUNTER — Ambulatory Visit (INDEPENDENT_AMBULATORY_CARE_PROVIDER_SITE_OTHER): Payer: Managed Care, Other (non HMO) | Admitting: Family Medicine

## 2019-10-03 ENCOUNTER — Telehealth (INDEPENDENT_AMBULATORY_CARE_PROVIDER_SITE_OTHER): Payer: Managed Care, Other (non HMO) | Admitting: Adult Health

## 2019-10-03 ENCOUNTER — Other Ambulatory Visit: Payer: Self-pay

## 2019-10-03 DIAGNOSIS — E049 Nontoxic goiter, unspecified: Secondary | ICD-10-CM

## 2019-10-03 NOTE — Progress Notes (Signed)
Virtual Visit via Video Note  I connected with Audrey Stafford  on 10/03/19 at  1:30 PM EST by a video enabled telemedicine application and verified that I am speaking with the correct person using two identifiers.  Location patient: home Location provider:work or home office Persons participating in the virtual visit: patient, provider  I discussed the limitations of evaluation and management by telemedicine and the availability of in person appointments. The patient expressed understanding and agreed to proceed.   HPI: 38 year old female who is being evaluated today for concern of enlarged thyroid.  She reports that about 2 days ago she started feeling as though something is in my throat".  When she woke up today she felt as though her thyroid was enlarged, more noticeably on the left side.  She denies any is "sore throat or painful when she swallows.  She denies choking.  She does endorse fatigue, dry skin, and weight gain recently.   She had an ultrasound done in January 2019 which showed:   Parenchymal Echotexture: Mildly heterogenous Isthmus: 0.3 cm Right lobe: 4.2 cm x 1.7 cm x 1.8 cm Left lobe: 4.2 cm x 1.6 cm x 1.8 cm  He had a biopsy of a nodule which was benign.  Last TSH in January 2020 was within normal limits.  She denies fevers, chills, or feeling acutely ill.   ROS: See pertinent positives and negatives per HPI.  Past Medical History:  Diagnosis Date  . Anemia   . Arthritis    Knees  . Back pain   . Chest pain   . Constipation   . Dysmenorrhea   . GERD (gastroesophageal reflux disease)   . Hypertension   . Joint pain   . Migraine without aura   . Multiple food allergies    Milk  . Palpitations     Past Surgical History:  Procedure Laterality Date  . CESAREAN SECTION  01/11/2005    Family History  Problem Relation Age of Onset  . Sudden Cardiac Death Mother        MI  . Breast cancer Paternal Aunt        3  . Hypertension Maternal Grandmother    . Diabetes Maternal Grandmother   . Hypertension Maternal Aunt       Current Outpatient Medications:  .  atenolol (TENORMIN) 25 MG tablet, Take 25 mg by mouth daily., Disp: , Rfl:  .  baclofen (LIORESAL) 10 MG tablet, Take 1-2 tablets by mouth three times daily as needed, Disp: , Rfl:  .  buPROPion (WELLBUTRIN SR) 150 MG 12 hr tablet, Take 1 tablet (150 mg total) by mouth daily., Disp: 30 tablet, Rfl: 0 .  naproxen sodium (ANAPROX DS) 550 MG tablet, Take 1 tablet (550 mg total) by mouth 2 (two) times daily with a meal., Disp: 30 tablet, Rfl: 2 .  predniSONE (DELTASONE) 50 MG tablet, Take 1 tablet (50 mg total) by mouth daily as needed. Take if needed for food allergy, Disp: 30 tablet, Rfl: 0 .  Vitamin D, Ergocalciferol, (DRISDOL) 1.25 MG (50000 UT) CAPS capsule, Take 1 capsule (50,000 Units total) by mouth every 7 (seven) days., Disp: 4 capsule, Rfl: 0  EXAM:  VITALS per patient if applicable:  GENERAL: alert, oriented, appears well and in no acute distress  HEENT: atraumatic, conjunttiva clear, no obvious abnormalities on inspection of external nose and ears. Difficult to examine on video conference   NECK: normal movements of the head and neck  LUNGS: on inspection no  signs of respiratory distress, breathing rate appears normal, no obvious gross SOB, gasping or wheezing  CV: no obvious cyanosis  MS: moves all visible extremities without noticeable abnormality  PSYCH/NEURO: pleasant and cooperative, no obvious depression or anxiety, speech and thought processing grossly intact  ASSESSMENT AND PLAN:  Discussed the following assessment and plan:  1. Enlarged thyroid - TSH; Future - US Soft Tissue Head/Neck; Future - T4, Free; Future - T3, Free; Future     I discussed the assessment and treatment plan with the patient. The patient was provided an opportunity to ask questions and all were answered. The patient agreed with the plan and demonstrated an understanding of the  instructions.   The patient was advised to call back or seek an in-person evaluation if the symptoms worsen or if the condition fails to improve as anticipated.   Dorothyann Peng, NP

## 2019-10-07 ENCOUNTER — Other Ambulatory Visit: Payer: Self-pay

## 2019-10-08 ENCOUNTER — Other Ambulatory Visit (INDEPENDENT_AMBULATORY_CARE_PROVIDER_SITE_OTHER): Payer: Managed Care, Other (non HMO)

## 2019-10-08 DIAGNOSIS — E049 Nontoxic goiter, unspecified: Secondary | ICD-10-CM | POA: Diagnosis not present

## 2019-10-08 LAB — TSH: TSH: 0.05 u[IU]/mL — ABNORMAL LOW (ref 0.35–4.50)

## 2019-10-08 LAB — T4, FREE: Free T4: 0.81 ng/dL (ref 0.60–1.60)

## 2019-10-08 LAB — T3, FREE: T3, Free: 4.2 pg/mL (ref 2.3–4.2)

## 2019-10-14 ENCOUNTER — Ambulatory Visit
Admission: RE | Admit: 2019-10-14 | Discharge: 2019-10-14 | Disposition: A | Discharge status: Home / Self Care | Payer: Managed Care, Other (non HMO) | Source: Ambulatory Visit | Attending: Adult Health | Admitting: Adult Health

## 2019-10-14 DIAGNOSIS — E049 Nontoxic goiter, unspecified: Secondary | ICD-10-CM

## 2019-10-16 ENCOUNTER — Other Ambulatory Visit: Payer: Self-pay | Admitting: Family Medicine

## 2019-10-16 DIAGNOSIS — E041 Nontoxic single thyroid nodule: Secondary | ICD-10-CM

## 2019-11-05 ENCOUNTER — Ambulatory Visit
Admission: RE | Admit: 2019-11-05 | Discharge: 2019-11-05 | Disposition: A | Discharge status: Home / Self Care | Payer: Managed Care, Other (non HMO) | Source: Ambulatory Visit | Attending: Adult Health | Admitting: Adult Health

## 2019-11-05 ENCOUNTER — Other Ambulatory Visit (HOSPITAL_COMMUNITY)
Admission: RE | Admit: 2019-11-05 | Discharge: 2019-11-05 | Disposition: A | Discharge status: Home / Self Care | Payer: Managed Care, Other (non HMO) | Source: Ambulatory Visit | Attending: Radiology | Admitting: Radiology

## 2019-11-05 DIAGNOSIS — E041 Nontoxic single thyroid nodule: Secondary | ICD-10-CM

## 2019-11-07 LAB — CYTOLOGY - NON PAP

## 2019-11-18 ENCOUNTER — Encounter: Payer: Self-pay | Admitting: Adult Health

## 2019-11-21 ENCOUNTER — Encounter (HOSPITAL_COMMUNITY): Payer: Self-pay

## 2019-11-21 ENCOUNTER — Encounter: Payer: Self-pay | Admitting: Adult Health

## 2019-11-21 ENCOUNTER — Other Ambulatory Visit: Payer: Self-pay | Admitting: Adult Health

## 2019-11-21 DIAGNOSIS — E0590 Thyrotoxicosis, unspecified without thyrotoxic crisis or storm: Secondary | ICD-10-CM

## 2019-11-22 ENCOUNTER — Other Ambulatory Visit: Payer: Self-pay

## 2019-11-25 ENCOUNTER — Other Ambulatory Visit: Payer: Self-pay

## 2019-11-25 ENCOUNTER — Other Ambulatory Visit: Payer: Managed Care, Other (non HMO)

## 2019-11-25 DIAGNOSIS — E0590 Thyrotoxicosis, unspecified without thyrotoxic crisis or storm: Secondary | ICD-10-CM

## 2019-11-27 LAB — TRAB (TSH RECEPTOR BINDING ANTIBODY): TRAB: 1.05 IU/L (ref <=2.00)

## 2019-11-29 ENCOUNTER — Other Ambulatory Visit: Payer: Self-pay | Admitting: Adult Health

## 2019-11-29 ENCOUNTER — Encounter: Payer: Self-pay | Admitting: Adult Health

## 2019-11-29 DIAGNOSIS — E0590 Thyrotoxicosis, unspecified without thyrotoxic crisis or storm: Secondary | ICD-10-CM

## 2019-11-29 NOTE — Telephone Encounter (Signed)
Please advise 

## 2019-12-23 DIAGNOSIS — G43009 Migraine without aura, not intractable, without status migrainosus: Secondary | ICD-10-CM | POA: Insufficient documentation

## 2019-12-26 ENCOUNTER — Other Ambulatory Visit: Payer: Self-pay

## 2019-12-26 ENCOUNTER — Ambulatory Visit: Payer: Managed Care, Other (non HMO) | Admitting: Obstetrics and Gynecology

## 2019-12-26 ENCOUNTER — Encounter: Payer: Self-pay | Admitting: Obstetrics and Gynecology

## 2019-12-26 VITALS — BP 128/68 | HR 101 | Temp 97.9°F | Ht 66.5 in | Wt 222.0 lb

## 2019-12-26 DIAGNOSIS — Z6835 Body mass index (BMI) 35.0-35.9, adult: Secondary | ICD-10-CM | POA: Diagnosis not present

## 2019-12-26 DIAGNOSIS — E559 Vitamin D deficiency, unspecified: Secondary | ICD-10-CM | POA: Diagnosis not present

## 2019-12-26 DIAGNOSIS — Z01419 Encounter for gynecological examination (general) (routine) without abnormal findings: Secondary | ICD-10-CM | POA: Diagnosis not present

## 2019-12-26 DIAGNOSIS — Z803 Family history of malignant neoplasm of breast: Secondary | ICD-10-CM | POA: Diagnosis not present

## 2019-12-26 DIAGNOSIS — Z0000 Encounter for general adult medical examination without abnormal findings: Secondary | ICD-10-CM

## 2019-12-26 NOTE — Progress Notes (Signed)
39 y.o. G1P1001 Significant Other Black or African American Not Hispanic or Latino female here for annual exam.  Patient states that she has a mass on her thyroid that she did have a biopsy on that came back benign. She is going to be starting care with an endocrinologist in two weeks.   She states that one of her breast seems to be bigger than the other. She just recently noticed it, her partner hasn't noticed. She has gained ~20 lbs in the last. No lumps.   She has a mirena IUD, placed in 6/19. Strings were lost at f/u visit, in place on ultrasound in 7/19. She has a cycle every 1-2 months.  Sexually active, lives with her partner of 20 years.  Period Duration (Days): 4-5 Period Pattern: Regular Menstrual Flow: Light Menstrual Control: Tampon Menstrual Control Change Freq (Hours): 4 Dysmenorrhea: (!) Mild Dysmenorrhea Symptoms: Cramping  Patient's last menstrual period was 12/25/2019.          Sexually active: Yes.    The current method of family planning is IUD.    Exercising: No.  The patient does not participate in regular exercise at present. Smoker:  no  Health Maintenance: Pap:  11/02/17 Neg HR HPV Neg, 03/08/13 Neg  History of abnormal Pap:  no TDaP:  11/01/17  Gardasil: Complete    reports that she has never smoked. She has never used smokeless tobacco. She reports that she does not drink alcohol or use drugs. Son is 14. She works for [employer], since Covid she has been working from home.  Past Medical History:  Diagnosis Date  . Anemia   . Arthritis    Knees  . Back pain   . Chest pain   . Constipation   . Dysmenorrhea   . GERD (gastroesophageal reflux disease)   . Hypertension   . Joint pain   . Migraine without aura   . Multiple food allergies    Milk  . Palpitations     Past Surgical History:  Procedure Laterality Date  . CESAREAN SECTION  01/11/2005    Current Outpatient Medications  Medication Sig Dispense Refill  . atenolol (TENORMIN) 25 MG tablet Take  25 mg by mouth daily.    . baclofen (LIORESAL) 10 MG tablet Take 1-2 tablets by mouth three times daily as needed    . buPROPion (WELLBUTRIN SR) 150 MG 12 hr tablet Take 1 tablet (150 mg total) by mouth daily. 30 tablet 0  . naproxen sodium (ANAPROX DS) 550 MG tablet Take 1 tablet (550 mg total) by mouth 2 (two) times daily with a meal. 30 tablet 2  . predniSONE (DELTASONE) 50 MG tablet Take 1 tablet (50 mg total) by mouth daily as needed. Take if needed for food allergy 30 tablet 0  . Vitamin D, Ergocalciferol, (DRISDOL) 1.25 MG (50000 UT) CAPS capsule Take 1 capsule (50,000 Units total) by mouth every 7 (seven) days. 4 capsule 0   No current facility-administered medications for this visit.    Family History  Problem Relation Age of Onset  . Sudden Cardiac Death Mother        MI  . Breast cancer Paternal Aunt        3  . Hypertension Maternal Grandmother   . Diabetes Maternal Grandmother   . Hypertension Maternal Aunt   Dads 2 sisters with breast cancer (only sisters) and her PGGM with breast cancer. Not sure when her aunts were diagnosed.   Review of Systems  Constitutional: Positive  for fatigue.  All other systems reviewed and are negative.   Exam:   BP 128/68   Pulse (!) 101   Temp 97.9 F (36.6 C)   Ht 5' 6.5" (1.689 m)   Wt 222 lb (100.7 kg)   LMP 12/25/2019   SpO2 96%   BMI 35.29 kg/m   Weight change: @WEIGHTCHANGE @ Height:   Height: 5' 6.5" (168.9 cm)  Ht Readings from Last 3 Encounters:  12/26/19 5' 6.5" (1.689 m)  01/01/19 5\' 6"  (1.676 m)  12/18/18 5\' 6"  (1.676 m)    General appearance: alert, cooperative and appears stated age Head: Normocephalic, without obvious abnormality, atraumatic Neck: no adenopathy, supple, symmetrical, trachea midline and thyroid large mass on left lobe of thyroid, right lobe is normal.  Lungs: clear to auscultation bilaterally Cardiovascular: regular rate and rhythm Breasts: normal appearance, no masses or tenderness, minimal  change in size of breasts Abdomen: soft, non-tender; non distended,  no masses,  no organomegaly Extremities: extremities normal, atraumatic, no cyanosis or edema Skin: Skin color, texture, turgor normal. No rashes or lesions Lymph nodes: Cervical, supraclavicular, and axillary nodes normal. No abnormal inguinal nodes palpated Neurologic: Grossly normal   Pelvic: External genitalia:  no lesions              Urethra:  normal appearing urethra with no masses, tenderness or lesions              Bartholins and Skenes: normal                 Vagina: normal appearing vagina with normal color and discharge, no lesions              Cervix: no lesions and IUD string not seen               Bimanual Exam:  Uterus:  normal size, contour, position, consistency, mobility, non-tender              Adnexa: no mass, fullness, tenderness               Rectovaginal: Confirms               Anus:  normal sphincter tone, no lesions  Gae Dry chaperoned for the exam.  A:  Well Woman with normal exam  FH of breast cancer  BMI 35  Vit d def  IUD check, missing strings, in place on ultrasound.   P:   Referral to genetics  Screening labs, HgbA1C, vit d  Discussed breast self exam  Discussed calcium and vit D intake

## 2019-12-26 NOTE — Patient Instructions (Signed)
EXERCISE AND DIET:  We recommended that you start or continue a regular exercise program for good health. Regular exercise means any activity that makes your heart beat faster and makes you sweat.  We recommend exercising at least 30 minutes per day at least 3 days a week, preferably 4 or 5.  We also recommend a diet low in fat and sugar.  Inactivity, poor dietary choices and obesity can cause diabetes, heart attack, stroke, and kidney damage, among others.    ALCOHOL AND SMOKING:  Women should limit their alcohol intake to no more than 7 drinks/beers/glasses of wine (combined, not each!) per week. Moderation of alcohol intake to this level decreases your risk of breast cancer and liver damage. And of course, no recreational drugs are part of a healthy lifestyle.  And absolutely no smoking or even second hand smoke. Most people know smoking can cause heart and lung diseases, but did you know it also contributes to weakening of your bones? Aging of your skin?  Yellowing of your teeth and nails?  CALCIUM AND VITAMIN D:  Adequate intake of calcium and Vitamin D are recommended.  The recommendations for exact amounts of these supplements seem to change often, but generally speaking 1,000 mg of calcium (between diet and supplement) and 800 units of Vitamin D per day seems prudent. Certain women may benefit from higher intake of Vitamin D.  If you are among these women, your doctor will have told you during your visit.    PAP SMEARS:  Pap smears, to check for cervical cancer or precancers,  have traditionally been done yearly, although recent scientific advances have shown that most women can have pap smears less often.  However, every woman still should have a physical exam from her gynecologist every year. It will include a breast check, inspection of the vulva and vagina to check for abnormal growths or skin changes, a visual exam of the cervix, and then an exam to evaluate the size and shape of the uterus and  ovaries.  And after 38 years of age, a rectal exam is indicated to check for rectal cancers. We will also provide age appropriate advice regarding health maintenance, like when you should have certain vaccines, screening for sexually transmitted diseases, bone density testing, colonoscopy, mammograms, etc.   MAMMOGRAMS:  All women over 40 years old should have a yearly mammogram. Many facilities now offer a "3D" mammogram, which may cost around $50 extra out of pocket. If possible,  we recommend you accept the option to have the 3D mammogram performed.  It both reduces the number of women who will be called back for extra views which then turn out to be normal, and it is better than the routine mammogram at detecting truly abnormal areas.    COLON CANCER SCREENING: Now recommend starting at age 45. At this time colonoscopy is not covered for routine screening until 50. There are take home tests that can be done between 45-49.   COLONOSCOPY:  Colonoscopy to screen for colon cancer is recommended for all women at age 50.  We know, you hate the idea of the prep.  We agree, BUT, having colon cancer and not knowing it is worse!!  Colon cancer so often starts as a polyp that can be seen and removed at colonscopy, which can quite literally save your life!  And if your first colonoscopy is normal and you have no family history of colon cancer, most women don't have to have it again for   10 years.  Once every ten years, you can do something that may end up saving your life, right?  We will be happy to help you get it scheduled when you are ready.  Be sure to check your insurance coverage so you understand how much it will cost.  It may be covered as a preventative service at no cost, but you should check your particular policy.      Breast Self-Awareness Breast self-awareness means being familiar with how your breasts look and feel. It involves checking your breasts regularly and reporting any changes to your  health care provider. Practicing breast self-awareness is important. A change in your breasts can be a sign of a serious medical problem. Being familiar with how your breasts look and feel allows you to find any problems early, when treatment is more likely to be successful. All women should practice breast self-awareness, including women who have had breast implants. How to do a breast self-exam One way to learn what is normal for your breasts and whether your breasts are changing is to do a breast self-exam. To do a breast self-exam: Look for Changes  1. Remove all the clothing above your waist. 2. Stand in front of a mirror in a room with good lighting. 3. Put your hands on your hips. 4. Push your hands firmly downward. 5. Compare your breasts in the mirror. Look for differences between them (asymmetry), such as: ? Differences in shape. ? Differences in size. ? Puckers, dips, and bumps in one breast and not the other. 6. Look at each breast for changes in your skin, such as: ? Redness. ? Scaly areas. 7. Look for changes in your nipples, such as: ? Discharge. ? Bleeding. ? Dimpling. ? Redness. ? A change in position. Feel for Changes Carefully feel your breasts for lumps and changes. It is best to do this while lying on your back on the floor and again while sitting or standing in the shower or tub with soapy water on your skin. Feel each breast in the following way:  Place the arm on the side of the breast you are examining above your head.  Feel your breast with the other hand.  Start in the nipple area and make  inch (2 cm) overlapping circles to feel your breast. Use the pads of your three middle fingers to do this. Apply light pressure, then medium pressure, then firm pressure. The light pressure will allow you to feel the tissue closest to the skin. The medium pressure will allow you to feel the tissue that is a little deeper. The firm pressure will allow you to feel the tissue  close to the ribs.  Continue the overlapping circles, moving downward over the breast until you feel your ribs below your breast.  Move one finger-width toward the center of the body. Continue to use the  inch (2 cm) overlapping circles to feel your breast as you move slowly up toward your collarbone.  Continue the up and down exam using all three pressures until you reach your armpit.  Write Down What You Find  Write down what is normal for each breast and any changes that you find. Keep a written record with breast changes or normal findings for each breast. By writing this information down, you do not need to depend only on memory for size, tenderness, or location. Write down where you are in your menstrual cycle, if you are still menstruating. If you are having trouble noticing differences   in your breasts, do not get discouraged. With time you will become more familiar with the variations in your breasts and more comfortable with the exam. How often should I examine my breasts? Examine your breasts every month. If you are breastfeeding, the best time to examine your breasts is after a feeding or after using a breast pump. If you menstruate, the best time to examine your breasts is 5-7 days after your period is over. During your period, your breasts are lumpier, and it may be more difficult to notice changes. When should I see my health care provider? See your health care provider if you notice:  A change in shape or size of your breasts or nipples.  A change in the skin of your breast or nipples, such as a reddened or scaly area.  Unusual discharge from your nipples.  A lump or thick area that was not there before.  Pain in your breasts.  Anything that concerns you.  

## 2019-12-27 ENCOUNTER — Other Ambulatory Visit: Payer: Self-pay | Admitting: Obstetrics and Gynecology

## 2019-12-27 LAB — CBC
Hematocrit: 40.9 % (ref 34.0–46.6)
Hemoglobin: 13.7 g/dL (ref 11.1–15.9)
MCH: 30.1 pg (ref 26.6–33.0)
MCHC: 33.5 g/dL (ref 31.5–35.7)
MCV: 90 fL (ref 79–97)
Platelets: 319 10*3/uL (ref 150–450)
RBC: 4.55 x10E6/uL (ref 3.77–5.28)
RDW: 13 % (ref 11.7–15.4)
WBC: 6.5 10*3/uL (ref 3.4–10.8)

## 2019-12-27 LAB — COMPREHENSIVE METABOLIC PANEL
ALT: 24 IU/L (ref 0–32)
AST: 22 IU/L (ref 0–40)
Albumin/Globulin Ratio: 1.6 (ref 1.2–2.2)
Albumin: 4.6 g/dL (ref 3.8–4.8)
Alkaline Phosphatase: 110 IU/L (ref 39–117)
BUN/Creatinine Ratio: 13 (ref 9–23)
BUN: 9 mg/dL (ref 6–20)
Bilirubin Total: 0.3 mg/dL (ref 0.0–1.2)
CO2: 21 mmol/L (ref 20–29)
Calcium: 9.8 mg/dL (ref 8.7–10.2)
Chloride: 106 mmol/L (ref 96–106)
Creatinine, Ser: 0.68 mg/dL (ref 0.57–1.00)
GFR calc Af Amer: 129 mL/min/{1.73_m2} (ref >59)
GFR calc non Af Amer: 112 mL/min/{1.73_m2} (ref >59)
Globulin, Total: 2.9 g/dL (ref 1.5–4.5)
Glucose: 81 mg/dL (ref 65–99)
Potassium: 4.3 mmol/L (ref 3.5–5.2)
Sodium: 141 mmol/L (ref 134–144)
Total Protein: 7.5 g/dL (ref 6.0–8.5)

## 2019-12-27 LAB — LIPID PANEL
Chol/HDL Ratio: 5.7 ratio — ABNORMAL HIGH (ref 0.0–4.4)
Cholesterol, Total: 193 mg/dL (ref 100–199)
HDL: 34 mg/dL — ABNORMAL LOW (ref >39)
LDL Chol Calc (NIH): 132 mg/dL — ABNORMAL HIGH (ref 0–99)
Triglycerides: 148 mg/dL (ref 0–149)
VLDL Cholesterol Cal: 27 mg/dL (ref 5–40)

## 2019-12-27 LAB — VITAMIN D 25 HYDROXY (VIT D DEFICIENCY, FRACTURES): Vit D, 25-Hydroxy: 10 ng/mL — ABNORMAL LOW (ref 30.0–100.0)

## 2019-12-27 LAB — HEMOGLOBIN A1C
Est. average glucose Bld gHb Est-mCnc: 108 mg/dL
Hgb A1c MFr Bld: 5.4 % (ref 4.8–5.6)

## 2019-12-27 NOTE — Telephone Encounter (Signed)
Medication refill request: naproxen sodium Last AEX:  12-26-2019 Next AEX: 12-28-2020 Last MMG (if hormonal medication request): n/a Refill authorized: please approve if appropriate

## 2019-12-27 NOTE — Telephone Encounter (Signed)
Refill on naproxen sodium. [facility] 336 [phone]

## 2019-12-30 MED ORDER — NAPROXEN SODIUM 550 MG PO TABS: 550.0000 mg | ORAL_TABLET | Freq: Two times a day (BID) | ORAL | 2 refills | Status: DC

## 2020-01-01 ENCOUNTER — Telehealth: Payer: Self-pay | Admitting: Obstetrics and Gynecology

## 2020-01-01 ENCOUNTER — Other Ambulatory Visit: Payer: Self-pay

## 2020-01-01 DIAGNOSIS — E559 Vitamin D deficiency, unspecified: Secondary | ICD-10-CM

## 2020-01-01 MED ORDER — VITAMIN D (ERGOCALCIFEROL) 1.25 MG (50000 UNIT) PO CAPS: 50000.0000 [IU] | ORAL_CAPSULE | ORAL | 0 refills | Status: DC

## 2020-01-01 NOTE — Telephone Encounter (Signed)
Patient is returning call to [facility].

## 2020-01-05 ENCOUNTER — Encounter: Payer: Self-pay | Admitting: Adult Health

## 2020-01-06 ENCOUNTER — Other Ambulatory Visit: Payer: Self-pay

## 2020-01-08 ENCOUNTER — Ambulatory Visit: Payer: Managed Care, Other (non HMO) | Admitting: Internal Medicine

## 2020-01-08 ENCOUNTER — Encounter: Payer: Self-pay | Admitting: Internal Medicine

## 2020-01-08 ENCOUNTER — Other Ambulatory Visit: Payer: Self-pay

## 2020-01-08 VITALS — BP 120/90 | HR 84 | Ht 66.5 in | Wt 224.0 lb

## 2020-01-08 DIAGNOSIS — E041 Nontoxic single thyroid nodule: Secondary | ICD-10-CM | POA: Diagnosis not present

## 2020-01-08 DIAGNOSIS — E0590 Thyrotoxicosis, unspecified without thyrotoxic crisis or storm: Secondary | ICD-10-CM | POA: Diagnosis not present

## 2020-01-08 LAB — T4, FREE: Free T4: 0.94 ng/dL (ref 0.60–1.60)

## 2020-01-08 LAB — TSH: TSH: <0.01 u[IU]/mL — ABNORMAL LOW (ref 0.35–4.50)

## 2020-01-08 LAB — T3, FREE: T3, Free: 4.8 pg/mL — ABNORMAL HIGH (ref 2.3–4.2)

## 2020-01-08 NOTE — Patient Instructions (Addendum)
Please stop at the lab.  If we need to start Methimazole, please stop the Methimazole (Tapazole) and call us or your primary care doctor if you develop: - sore throat - fever - yellow skin - dark urine - light colored stools As we will then need to check your blood counts and liver tests.  Please come back for a follow-up appointment in 3 months.   Hyperthyroidism  Hyperthyroidism is when the thyroid gland is too active (overactive). The thyroid gland is a small gland located in the lower front part of the neck, just in front of the windpipe (trachea). This gland makes hormones that help control how the body uses food for energy (metabolism) as well as how the heart and brain function. These hormones also play a role in keeping your bones strong. When the thyroid is overactive, it produces too much of a hormone called thyroxine. What are the causes? This condition may be caused by:  Graves' disease. This is a disorder in which the body's disease-fighting system (immune system) attacks the thyroid gland. This is the most common cause.  Inflammation of the thyroid gland.  A tumor in the thyroid gland.  Use of certain medicines, including: ? Prescription thyroid hormone replacement. ? Herbal supplements that mimic thyroid hormones. ? Amiodarone therapy.  Solid or fluid-filled lumps within your thyroid gland (thyroid nodules).  Taking in a large amount of iodine from foods or medicines. What increases the risk? You are more likely to develop this condition if:  You are female.  You have a family history of thyroid conditions.  You smoke tobacco.  You use a medicine called lithium.  You take medicines that affect the immune system (immunosuppressants). What are the signs or symptoms? Symptoms of this condition include:  Nervousness.  Inability to tolerate heat.  Unexplained weight loss.  Diarrhea.  Change in the texture of hair or skin.  Heart skipping beats or  making extra beats.  Rapid heart rate.  Loss of menstruation.  Shaky hands.  Fatigue.  Restlessness.  Sleep problems.  Enlarged thyroid gland or a lump in the thyroid (nodule). You may also have symptoms of Graves' disease, which may include:  Protruding eyes.  Dry eyes.  Red or swollen eyes.  Problems with vision. How is this diagnosed? This condition may be diagnosed based on:  Your symptoms and medical history.  A physical exam.  Blood tests.  Thyroid ultrasound. This test involves using sound waves to produce images of the thyroid gland.  A thyroid scan. A radioactive substance is injected into a vein, and images show how much iodine is present in the thyroid.  Radioactive iodine uptake test (RAIU). A small amount of radioactive iodine is given by mouth to see how much iodine the thyroid absorbs after a certain amount of time. How is this treated? Treatment depends on the cause and severity of the condition. Treatment may include:  Medicines to reduce the amount of thyroid hormone your body makes.  Radioactive iodine treatment (radioiodine therapy). This involves swallowing a small dose of radioactive iodine, in capsule or liquid form, to kill thyroid cells.  Surgery to remove part or all of your thyroid gland. You may need to take thyroid hormone replacement medicine for the rest of your life after thyroid surgery.  Medicines to help manage your symptoms. Follow these instructions at home:   Take over-the-counter and prescription medicines only as told by your health care provider.  Do not use any products that contain nicotine  or tobacco, such as cigarettes and e-cigarettes. If you need help quitting, ask your health care provider.  Follow any instructions from your health care provider about diet. You may be instructed to limit foods that contain iodine.  Keep all follow-up visits as told by your health care provider. This is important. ? You will need  to have blood tests regularly so that your health care provider can monitor your condition. Contact a health care provider if:  Your symptoms do not get better with treatment.  You have a fever.  You are taking thyroid hormone replacement medicine and you: ? Have symptoms of depression. ? Feel like you are tired all the time. ? Gain weight. Get help right away if:  You have chest pain.  You have decreased alertness or a change in your awareness.  You have abdominal pain.  You feel dizzy.  You have a rapid heartbeat.  You have an irregular heartbeat.  You have difficulty breathing. Summary  The thyroid gland is a small gland located in the lower front part of the neck, just in front of the windpipe (trachea).  Hyperthyroidism is when the thyroid gland is too active (overactive) and produces too much of a hormone called thyroxine.  The most common cause is Graves' disease, a disorder in which your immune system attacks the thyroid gland.  Hyperthyroidism can cause various symptoms, such as unexplained weight loss, nervousness, inability to tolerate heat, or changes in your heartbeat.  Treatment may include medicine to reduce the amount of thyroid hormone your body makes, radioiodine therapy, surgery, or medicines to manage symptoms. This information is not intended to replace advice given to you by your health care provider. Make sure you discuss any questions you have with your health care provider. Document Revised: 09/22/2017 Document Reviewed: 09/20/2017 Elsevier Patient Education  2020 [employer]   Thyroid Nodule  A thyroid nodule is an isolated growth of thyroid cells that forms a lump in your thyroid gland. The thyroid gland is a butterfly-shaped gland. It is found in the lower front of your neck. This gland sends chemical messengers (hormones) through your blood to all parts of your body. These hormones are important in regulating your body temperature and  helping your body to use energy. Thyroid nodules are common. Most are not cancerous (benign). You may have one nodule or several nodules. Different types of thyroid nodules include nodules that:  Grow and fill with fluid (thyroid cysts).  Produce too much thyroid hormone (hot nodules or hyperthyroid).  Produce no thyroid hormone (cold nodules or hypothyroid).  Form from cancer cells (thyroid cancers). What are the causes? In most cases, the cause of this condition is not known. What increases the risk? The following factors may make you more likely to develop this condition.  Age. Thyroid nodules become more common in people who are older than 38 years of age.  Gender. ? Benign thyroid nodules are more common in women. ? Cancerous (malignant) thyroid nodules are more common in men.  A family history that includes: ? Thyroid nodules. ? Pheochromocytoma. ? Thyroid carcinoma. ? Hyperparathyroidism.  Certain kinds of thyroid diseases, such as Hashimoto's thyroiditis.  Lack of iodine in your diet.  A history of head and neck radiation, such as from previous cancer treatment. What are the signs or symptoms? In many cases, there are no symptoms. If you have symptoms, they may include:  A lump in your lower neck.  Feeling a lump or tickle in your throat.  Pain in your neck, jaw, or ear.  Having trouble swallowing. Hot nodules may cause symptoms that include:  Weight loss.  Warm, flushed skin.  Feeling hot.  Feeling nervous.  A racing heartbeat. Cold nodules may cause symptoms that include:  Weight gain.  Dry skin.  Brittle hair. This may also occur with hair loss.  Feeling cold.  Fatigue. Thyroid cancer nodules may cause symptoms that include:  Hard nodules that feel stuck to the thyroid gland.  Hoarseness.  Lumps in the glands near your thyroid (lymph nodes). How is this diagnosed? A thyroid nodule may be felt by your health care provider during a  physical exam. This condition may also be diagnosed based on your symptoms. You may also have tests, including:  An ultrasound. This may be done to confirm the diagnosis.  A biopsy. This involves taking a sample from the nodule and looking at it under a microscope.  Blood tests to make sure that your thyroid is working properly.  A thyroid scan. This test uses a radioactive tracer injected into a vein to create an image of the thyroid gland on a computer screen.  Imaging tests such as MRI or CT scan. These may be done if: ? Your nodule is large. ? Your nodule is blocking your airway. ? Cancer is suspected. How is this treated? Treatment depends on the cause and size of your nodule or nodules. If the nodule is benign, treatment may not be necessary. Your health care provider may monitor the nodule to see if it goes away without treatment. If the nodule continues to grow, is cancerous, or does not go away, treatment may be needed. Treatment may include:  Having a cystic nodule drained with a needle.  Ablation therapy. In this treatment, alcohol is injected into the area of the nodule to destroy the cells. Ablation with heat (thermal ablation) may also be used.  Radioactive iodine. In this treatment, radioactive iodine is given as a pill or liquid that you drink. This substance causes the thyroid nodule to shrink.  Surgery to remove the nodule. Part or all of your thyroid gland may need to be removed as well.  Medicines. Follow these instructions at home:  Pay attention to any changes in your nodule.  Take over-the-counter and prescription medicines only as told by your health care provider.  Keep all follow-up visits as told by your health care provider. This is important. Contact a health care provider if:  Your voice changes.  You have trouble swallowing.  You have pain in your neck, ear, or jaw that is getting worse.  Your nodule gets bigger.  Your nodule starts to make it  harder for you to breathe.  Your muscles look like they are shrinking (muscle wasting). Get help right away if:  You have chest pain.  There is a loss of consciousness.  You have a sudden fever.  You feel confused.  You are seeing or hearing things that other people do not see or hear (having hallucinations).  You feel very weak.  You have mood swings.  You feel very restless.  You feel suddenly nauseous or throw up.  You suddenly have diarrhea. Summary  A thyroid nodule is an isolated growth of thyroid cells that forms a lump in your thyroid gland.  Thyroid nodules are common. Most are not cancerous (benign). You may have one nodule or several nodules.  Treatment depends on the cause and size of your nodule or nodules. If the nodule is  benign, treatment may not be necessary.  Your health care provider may monitor the nodule to see if it goes away without treatment. If the nodule continues to grow, is cancerous, or does not go away, treatment may be needed. This information is not intended to replace advice given to you by your health care provider. Make sure you discuss any questions you have with your health care provider. Document Revised: 05/25/2018 Document Reviewed: 05/28/2018 Elsevier Patient Education  [place].

## 2020-01-08 NOTE — Progress Notes (Addendum)
Patient ID: Audrey Stafford, female   DOB: June 26, 1981, 39 y.o.   MRN: [id number]   This visit occurred during the SARS-CoV-2 public health emergency.  Safety protocols were in place, including screening questions prior to the visit, additional usage of staff PPE, and extensive cleaning of exam room while observing appropriate contact time as indicated for disinfecting solutions.   HPI  Audrey Stafford is a 39 y.o.-year-old female, referred by her PCP, Nafziger, Tommi Rumps, NP, for evaluation and management of thyrotoxicosis and thyroid nodule.  Thyrotoxicosis:  Patient was recently found to have a suppressed TSH in 09/2019.  Free T4 and free T3 were normal.  I reviewed pt's thyroid tests: Lab Results  Component Value Date   TSH 0.05 (L) 10/08/2019   TSH 0.878 11/15/2018   TSH 1.48 11/16/2017   TSH 2.02 07/04/2017   TSH 0.57 08/21/2015   TSH 1.15 08/18/2014   TSH 0.843 03/08/2013   FREET4 0.81 10/08/2019   FREET4 0.96 11/15/2018   T3FREE 4.2 10/08/2019  07/23/2009: TSH 1.11  Antithyroid antibodies: 11/25/2019: TRAb 1.05 (<2) No results found for: TSI  She mentions: - + insomnia - falls asleep but wakes up at 2-3 am and cannot foll asleep - no fatigue - + excessive sweating/heat intolerance - no tremors - no anxiety - + palpitations - no hyperdefecation - no no weight loss - no hair loss  Pt does not have a FH of thyroid ds. + FH of RA in MGM. No FH of thyroid cancer. No h/o radiation tx to head or neck.  No seaweed or kelp, no recent contrast studies. No herbal supplements. No Biotin use.  She takes prednisone prn for food allergies - last dose 08/2019.  Thyroid nodule: - diagnosed in 2019.  Reviewed pertinent studies: Thyroid U/S (11/17/2017): 1.6 x 1.4 x 1.2 cm solid hypoechoic left mid lobe nodule      FNA (02/22/20219):  Left thyroid nodule, Fine Needle Aspiration II (smears): Benign thyroid nodule (Bethesda category 2). Specimen Adequacy:Satisfactory for  evaluation.  Thyroid U/S (10/14/2019): 3.7 x 2.6 x 2.2 cm solid, isoechoic nodule, significantly increased from last ultrasound Nodule # 1: Prior biopsy: No Location: Left; Mid Maximum size: 3.7 cm; Other 2 dimensions: 2.6 x 2.2 cm, previously, 1.6 x 1.4 x 1.2 cm Composition: solid/almost completely solid (2) Echogenicity: isoechoic (1) Significant change in size (>/= 20% in two dimensions and minimal increase of 2 mm): Yes **Given size (>/= 2.5 cm) and appearance, fine needle aspiration of this mildly suspicious nodule should be considered based on TI-RADS Criteria.      FNA (11/05/2019): Atypia of unknown significance (Bethesda category 3) Afirma: benign  Pt denies: - feeling nodules in neck - hoarseness - dysphagia - choking - SOB with lying down  She also has HTN, HL.  ROS: Constitutional: + see HPI Eyes: no blurry vision, no xerophthalmia ENT: no sore throat, + see HPI Cardiovascular: no CP/SOB/+ palpitations/no leg swelling Respiratory: no cough/SOB Gastrointestinal: no N/V/D/C Musculoskeletal: no muscle/+ joint aches Skin: no rashes, + itching Neurological: no tremors/numbness/tingling/dizziness, + migraines (on Baclofen for this - [facility]) Psychiatric: no depression/anxiety  Past Medical History:  Diagnosis Date  . Anemia   . Arthritis    Knees  . Back pain   . Chest pain   . Constipation   . Dysmenorrhea   . GERD (gastroesophageal reflux disease)   . Hypertension   . Joint pain   . Migraine without aura   . Multiple food allergies  Milk  . Palpitations    Past Surgical History:  Procedure Laterality Date  . CESAREAN SECTION  01/11/2005   Social History   Socioeconomic History  . Marital status: Significant Other    Spouse name: Henderson Cloud  . Number of children: 1  . Years of education: Not on file  . Highest education level: Not on file  Occupational History  . Occupation: Nurse, children's   Tobacco Use  .  Smoking status: Never Smoker  . Smokeless tobacco: Never Used  Substance and Sexual Activity  . Alcohol use: No  . Drug use: No  . Sexual activity: Yes    Partners: Male    Birth control/protection: Pill  Other Topics Concern  . Not on file  Social History Narrative   She works at [employer].    Not married    1 child ( boy)   Social Determinants of Corporate investment banker Strain:   . Difficulty of Paying Living Expenses:   Food Insecurity:   . Worried About Programme researcher, broadcasting/film/video in the Last Year:   . Barista in the Last Year:   Transportation Needs:   . Freight forwarder (Medical):   Marland Kitchen Lack of Transportation (Non-Medical):   Physical Activity:   . Days of Exercise per Week:   . Minutes of Exercise per Session:   Stress:   . Feeling of Stress :   Social Connections:   . Frequency of Communication with Friends and Family:   . Frequency of Social Gatherings with Friends and Family:   . Attends Religious Services:   . Active Member of Clubs or Organizations:   . Attends Banker Meetings:   Marland Kitchen Marital Status:   Intimate Partner Violence:   . Fear of Current or Ex-Partner:   . Emotionally Abused:   Marland Kitchen Physically Abused:   . Sexually Abused:    Current Outpatient Medications on File Prior to Visit  Medication Sig Dispense Refill  . atenolol (TENORMIN) 25 MG tablet Take 25 mg by mouth daily.    . baclofen (LIORESAL) 10 MG tablet Take 1-2 tablets by mouth three times daily as needed    . buPROPion (WELLBUTRIN SR) 150 MG 12 hr tablet Take 1 tablet (150 mg total) by mouth daily. 30 tablet 0  . naproxen sodium (ANAPROX DS) 550 MG tablet Take 1 tablet (550 mg total) by mouth 2 (two) times daily with a meal. 30 tablet 2  . predniSONE (DELTASONE) 50 MG tablet Take 1 tablet (50 mg total) by mouth daily as needed. Take if needed for food allergy 30 tablet 0  . Vitamin D, Ergocalciferol, (DRISDOL) 1.25 MG (50000 UNIT) CAPS capsule Take 1 capsule (50,000 Units  total) by mouth every 7 (seven) days. 12 capsule 0   No current facility-administered medications on file prior to visit.   Allergies  Allergen Reactions  . Milk-Related Compounds Swelling   Family History  Problem Relation Age of Onset  . Sudden Cardiac Death Mother        MI  . Breast cancer Paternal Aunt        3  . Hypertension Maternal Grandmother   . Diabetes Maternal Grandmother   . Hypertension Maternal Aunt     PE: BP 120/90   Pulse 84   Ht 5' 6.5" (1.689 m)   Wt 224 lb (101.6 kg)   LMP 12/25/2019   SpO2 97%   BMI 35.61 kg/m  Wt Readings from  Last 3 Encounters:  01/08/20 224 lb (101.6 kg)  12/26/19 222 lb (100.7 kg)  01/01/19 190 lb (86.2 kg)   Constitutional: overweight, in NAD Eyes: PERRLA, EOMI, no exophthalmos, no lid lag, no stare ENT: moist mucous membranes, + left thyroid nodule clearly palpable, freely movable with deglutition, no thyroid bruits, no cervical lymphadenopathy Cardiovascular: RRR, No MRG Respiratory: CTA B Gastrointestinal: abdomen soft, NT, ND, BS+ Musculoskeletal: no deformities, strength intact in all 4 Skin: moist, warm, no rashes Neurological: no tremor with outstretched hands, DTR normal in all 4  ASSESSMENT: 1. Thyrotoxicosis  2.  Thyroid nodule  PLAN:  1. Patient with a recently found low TSH x1, with normal free thyroid hormones, with several thyrotoxic sxs: heat intolerance, palpitations, insomnia, but without weight loss, tremors, anxiety, hyper defecation - she does not appear to have exogenous causes for the low TSH.  - We discussed that possible causes of thyrotoxicosis are:  Marland Kitchen Graves ds   . Thyroiditis . toxic multinodular goiter/ toxic adenoma (she does have a large left thyroid nodule on ultrasound and this is clearly palpable today). - will check the TSH, fT3 and fT4 and also add thyroid stimulating antibodies to screen for Graves' disease.  - If the tests remain abnormal, we may need an uptake and scan to  differentiate between the 3 above possible etiologies  - we discussed about possible modalities of treatment for the above conditions, to include methimazole use, radioactive iodine ablation or (last resort) surgery. - I do not feel that we need to add beta blockers at this time, since she is not tachycardic or tremulous.   - no signs of Graves' ophthalmopathy: she does not have any double vision, blurry vision, eye pain, chemosis. - RTC in 3 months, but possibly sooner for repeat labs  2.  Thyroid nodule -No neck compression symptoms -Patient has a large left thyroid nodule, which appears to have more than doubled in size in 1 year.  This can be concerning, however, in the setting of thyrotoxicosis the nodule may be a site of inflammation (pseudonodule). -This nodule was biopsied with inconclusive results, however, the Afirma molecular marker test result was benign.  This carries a risk for cancer of approximately 4%, which is very low. -However, she recently noticed the nodule in the mirror and a repeat ultrasound from 2020 showed a completely different nodule appearance compared to 2019.  Not only that the size increased significantly, but the nodule now appears to be iso-, rather than hypoechoic, and has some colloid areas.  We discussed that this may trigger the need for another biopsy, but for now, we will wait for the results of her TFTs, since she may need an uptake and scan first. -I plan to repeat the ultrasound of her thyroid after she is euthyroid for several months  Component     Latest Ref Rng & Units 01/08/2020  TSH     0.35 - 4.50 uIU/mL <0.01 (L)  T4,Free(Direct)     0.60 - 1.60 ng/dL 2.63  Triiodothyronine,Free,Serum     2.3 - 4.2 pg/mL 4.8 (H)  TSI     <140 % baseline <89   Tests remain in the thyrotoxic range.  Since her Graves' antibodies are not elevated, I would suggest to continue with a thyroid uptake and scan to check for toxic nodules versus thyroiditis.  Thyroid  uptake and scan (02/13/2020):  CLINICAL DATA:  Neck swelling and tenderness with difficulty swallowing, weight gain and heat intolerance. Concern for subclinical  hyperthyroidism. Serum TSH less than 0.01. Fine-needle aspiration of left thyroid nodule 3 months ago.  EXAM: THYROID SCAN AND UPTAKE - 4 AND 24 HOURS  TECHNIQUE: Following oral administration of I-123 capsule, anterior planar imaging was acquired at 24 hours. Thyroid uptake was calculated with a thyroid probe at 4-6 hours and 24 hours.  RADIOPHARMACEUTICALS:  414.0 uCi I-123 sodium iodide p.o.  COMPARISON:  Ultrasound 10/14/2019  FINDINGS: There is moderate, slightly heterogeneous uptake within the dominant left thyroid nodule demonstrated by ultrasound. There is relative suppression of activity throughout the remainder of the thyroid gland.  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 Relative suppression of activity in the remainder of the thyroid gland.  Patient's left thyroid nodule is unchanged toxic/hot nodule, and these nodules usually have low risk for cancer.  In the setting of a previous benign biopsy, my suggestion would be for RAI treatment, rather than surgery.  We will discuss with patient.  Carlus Pavlov, MD PhD [facility] Endocrinology

## 2020-01-13 LAB — THYROID STIMULATING IMMUNOGLOBULIN: TSI: <89 % baseline (ref <140)

## 2020-01-14 ENCOUNTER — Encounter: Payer: Self-pay | Admitting: Internal Medicine

## 2020-02-11 ENCOUNTER — Encounter (HOSPITAL_COMMUNITY)
Admission: RE | Admit: 2020-02-11 | Discharge: 2020-02-11 | Disposition: A | Discharge status: Home / Self Care | Payer: Managed Care, Other (non HMO) | Source: Ambulatory Visit | Attending: Internal Medicine | Admitting: Internal Medicine

## 2020-02-11 ENCOUNTER — Other Ambulatory Visit: Payer: Self-pay

## 2020-02-11 DIAGNOSIS — E0590 Thyrotoxicosis, unspecified without thyrotoxic crisis or storm: Secondary | ICD-10-CM | POA: Insufficient documentation

## 2020-02-11 DIAGNOSIS — E041 Nontoxic single thyroid nodule: Secondary | ICD-10-CM | POA: Diagnosis present

## 2020-02-11 MED ORDER — SODIUM IODIDE I-123 7.4 MBQ CAPS
414.0000 | ORAL_CAPSULE | Freq: Once | ORAL | Status: AC
  Administered 2020-02-11: 414 via ORAL

## 2020-02-12 ENCOUNTER — Encounter (HOSPITAL_COMMUNITY)
Admission: RE | Admit: 2020-02-12 | Discharge: 2020-02-12 | Disposition: A | Discharge status: Home / Self Care | Payer: Managed Care, Other (non HMO) | Source: Ambulatory Visit | Attending: Internal Medicine | Admitting: Internal Medicine

## 2020-02-17 ENCOUNTER — Encounter: Payer: Self-pay | Admitting: Internal Medicine

## 2020-02-17 ENCOUNTER — Other Ambulatory Visit: Payer: Self-pay | Admitting: Internal Medicine

## 2020-02-17 DIAGNOSIS — E0510 Thyrotoxicosis with toxic single thyroid nodule without thyrotoxic crisis or storm: Secondary | ICD-10-CM

## 2020-02-17 DIAGNOSIS — E0590 Thyrotoxicosis, unspecified without thyrotoxic crisis or storm: Secondary | ICD-10-CM

## 2020-03-12 ENCOUNTER — Ambulatory Visit (HOSPITAL_COMMUNITY)
Admission: RE | Admit: 2020-03-12 | Discharge: 2020-03-12 | Disposition: A | Discharge status: Home / Self Care | Payer: Managed Care, Other (non HMO) | Source: Ambulatory Visit | Attending: Internal Medicine | Admitting: Internal Medicine

## 2020-03-12 ENCOUNTER — Other Ambulatory Visit: Payer: Self-pay

## 2020-03-12 DIAGNOSIS — E0510 Thyrotoxicosis with toxic single thyroid nodule without thyrotoxic crisis or storm: Secondary | ICD-10-CM | POA: Insufficient documentation

## 2020-03-12 LAB — PREGNANCY, URINE: Preg Test, Ur: NEGATIVE

## 2020-03-12 MED ORDER — SODIUM IODIDE I 131 CAPSULE
30.2000 | Freq: Once | INTRAVENOUS | Status: AC
  Administered 2020-03-12: 30.2 via ORAL

## 2020-04-17 ENCOUNTER — Encounter: Payer: Self-pay | Admitting: Internal Medicine

## 2020-04-17 ENCOUNTER — Ambulatory Visit (INDEPENDENT_AMBULATORY_CARE_PROVIDER_SITE_OTHER): Payer: Managed Care, Other (non HMO) | Admitting: Internal Medicine

## 2020-04-17 ENCOUNTER — Other Ambulatory Visit: Payer: Self-pay

## 2020-04-17 VITALS — BP 126/90 | HR 76 | Ht 66.5 in | Wt 224.0 lb

## 2020-04-17 DIAGNOSIS — E0510 Thyrotoxicosis with toxic single thyroid nodule without thyrotoxic crisis or storm: Secondary | ICD-10-CM | POA: Diagnosis not present

## 2020-04-17 LAB — T3, FREE: T3, Free: 3.1 pg/mL (ref 2.3–4.2)

## 2020-04-17 LAB — TSH: TSH: 4.13 u[IU]/mL (ref 0.35–4.50)

## 2020-04-17 LAB — T4, FREE: Free T4: 0.61 ng/dL (ref 0.60–1.60)

## 2020-04-17 NOTE — Patient Instructions (Signed)
Please stop at the lab.  Please come back for a follow-up appointment in 6 months.  

## 2020-04-17 NOTE — Progress Notes (Signed)
No intervalPatient ID: Audrey Stafford, female   DOB: 1981/10/17, 39 y.o.   MRN: [id number]   This visit occurred during the SARS-CoV-2 public health emergency.  Safety protocols were in place, including screening questions prior to the visit, additional usage of staff PPE, and extensive cleaning of exam room while observing appropriate contact time as indicated for disinfecting solutions.   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  Last visit 3 months ago.  Since last visit, we diagnosed a toxic thyroid nodule based on thyroid uptake and scan and she had RAI treatment 1 month ago. She feels much better after the RAI treatment!  Patient was found to have a suppressed TSH in 09/2019.  Free T4 and free T3 were normal.  Reviewed her TFTs: Lab Results  Component Value Date   TSH <0.01 (L) 01/08/2020   TSH 0.05 (L) 10/08/2019   TSH 0.878 11/15/2018   TSH 1.48 11/16/2017   TSH 2.02 07/04/2017   TSH 0.57 08/21/2015   TSH 1.15 08/18/2014   TSH 0.843 03/08/2013   FREET4 0.94 01/08/2020   FREET4 0.81 10/08/2019   FREET4 0.96 11/15/2018   T3FREE 4.8 (H) 01/08/2020   T3FREE 4.2 10/08/2019  07/23/2009: TSH 1.11  Her antithyroid antibodies are not elevated: 11/25/2019: TRAb 1.05 (<2) Lab Results  Component Value Date   TSI <89 01/08/2020     Thyroid U/S (11/17/2017): 1.6 x 1.4 x 1.2 cm solid hypoechoic left mid lobe nodule      FNA (02/22/20219):  Left thyroid nodule, Fine Needle Aspiration II (smears): Benign thyroid nodule (Bethesda category 2). Specimen Adequacy:Satisfactory for evaluation.  Thyroid U/S (10/14/2019): 3.7 x 2.6 x 2.2 cm solid, isoechoic nodule, significantly increased from last ultrasound Nodule # 1: Prior biopsy: No Location: Left; Mid Maximum size: 3.7 cm; Other 2 dimensions: 2.6 x 2.2 cm, previously, 1.6 x 1.4 x 1.2  cm Composition: solid/almost completely solid (2) Echogenicity: isoechoic (1) Significant change in size (>/= 20% in two dimensions and minimal increase of 2 mm): Yes **Given size (>/= 2.5 cm) and appearance, fine needle aspiration of this mildly suspicious nodule should be considered based on TI-RADS Criteria.      FNA (11/05/2019): Atypia of unknown significance (Bethesda category 3) Afirma: benign  Thyroid uptake and scan (02/13/2020):  There is moderate, slightly heterogeneous uptake within the dominant left thyroid nodule demonstrated by ultrasound. There is relative suppression of activity throughout the remainder of the thyroid gland.  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 Relative suppression of activity in the remainder of the thyroid gland.  RAI treatment (03/16/2020)  At last visit, she mentioned insomnia, heat intolerance, palpitations. All these resolved after the RAI tx!  Pt does not have a FH of thyroid ds. + FH of RA in MGM. No FH of thyroid cancer. No h/o radiation tx to head or neck other than RAI treatment.  No recent contrast studies. No herbal supplements. No Biotin use. No recent steroids use.   She takes prednisone as needed for food allergies - Not recently.  Pt denies: - feeling nodules in neck - hoarseness - dysphagia - choking - SOB with lying down  She also has HTN, HL.  ROS: Constitutional: no weight gain/no weight loss, no fatigue,  no subjective hyperthermia, no subjective hypothermia Eyes: no blurry vision, no xerophthalmia ENT: no sore throat, + see HPI Cardiovascular: no CP/no SOB/no palpitations/no leg swelling Respiratory: no cough/no SOB/no wheezing Gastrointestinal: no N/no V/no D/no C/no acid reflux Musculoskeletal: no muscle aches/+ joint aches Skin: no rashes, no hair  loss Neurological: no tremors/no numbness/no tingling/no dizziness, + migraines (on Baclofen for this - [facility])  I reviewed pt's medications, allergies, PMH, social hx, family hx, and changes were documented in the history of present illness. Otherwise, unchanged from my initial visit note.  Past Medical History:  Diagnosis Date  . Anemia   . Arthritis    Knees  . Back pain   . Chest pain   . Constipation   . Dysmenorrhea   . GERD (gastroesophageal reflux disease)   . Hypertension   . Joint pain   . Migraine without aura   . Multiple food allergies    Milk  . Palpitations    Past Surgical History:  Procedure Laterality Date  . CESAREAN SECTION  01/11/2005   Social History   Socioeconomic History  . Marital status: Significant Other    Spouse name: Henderson Cloud  . Number of children: 1  . Years of education: Not on file  . Highest education level: Not on file  Occupational History  . Occupation: Nurse, children's   Tobacco Use  . Smoking status: Never Smoker  . Smokeless tobacco: Never Used  Vaping Use  . Vaping Use: Never used  Substance and Sexual Activity  . Alcohol use: No  . Drug use: No  . Sexual activity: Yes    Partners: Male    Birth control/protection: Pill  Other Topics Concern  . Not on file  Social History Narrative   She works at [employer].    Not married    1 child ( boy)   Social Determinants of Corporate investment banker Strain:   . Difficulty of Paying Living Expenses:   Food Insecurity:   . Worried About Programme researcher, broadcasting/film/video in the Last Year:   . Barista in the Last Year:   Transportation Needs:   . Freight forwarder (Medical):   Marland Kitchen Lack of Transportation (Non-Medical):   Physical Activity:   . Days of Exercise per Week:   . Minutes of Exercise per Session:   Stress:   . Feeling of Stress :   Social Connections:   . Frequency of Communication with Friends and Family:   . Frequency of Social  Gatherings with Friends and Family:   . Attends Religious Services:   . Active Member of Clubs or Organizations:   . Attends Banker Meetings:   Marland Kitchen Marital Status:   Intimate Partner Violence:   . Fear of Current or Ex-Partner:   . Emotionally Abused:   Marland Kitchen Physically Abused:   . Sexually Abused:    Current Outpatient Medications on File Prior to Visit  Medication Sig Dispense Refill  . atenolol (TENORMIN) 25 MG tablet Take 25 mg by mouth daily.    . baclofen (LIORESAL) 10 MG tablet Take 1-2 tablets by mouth three times daily as needed    . buPROPion (WELLBUTRIN SR) 150 MG 12 hr tablet Take 1 tablet (150 mg total) by mouth daily. 30 tablet 0  . naproxen sodium (ANAPROX DS) 550 MG tablet Take 1 tablet (550 mg total) by mouth 2 (two) times daily with a meal. 30 tablet 2  .  predniSONE (DELTASONE) 50 MG tablet Take 1 tablet (50 mg total) by mouth daily as needed. Take if needed for food allergy 30 tablet 0  . Vitamin D, Ergocalciferol, (DRISDOL) 1.25 MG (50000 UNIT) CAPS capsule Take 1 capsule (50,000 Units total) by mouth every 7 (seven) days. 12 capsule 0   No current facility-administered medications on file prior to visit.   Allergies  Allergen Reactions  . Milk-Related Compounds Swelling   Family History  Problem Relation Age of Onset  . Sudden Cardiac Death Mother        MI  . Breast cancer Paternal Aunt        3  . Hypertension Maternal Grandmother   . Diabetes Maternal Grandmother   . Hypertension Maternal Aunt     PE: BP 126/90   Pulse 76   Ht 5' 6.5" (1.689 m)   Wt 224 lb (101.6 kg)   SpO2 97%   BMI 35.61 kg/m  Wt Readings from Last 3 Encounters:  04/17/20 224 lb (101.6 kg)  01/08/20 224 lb (101.6 kg)  12/26/19 222 lb (100.7 kg)   Constitutional: overweight, in NAD Eyes: PERRLA, EOMI, no exophthalmos ENT: moist mucous membranes, no thyromegaly, + palpable left thyroid nodule, freely movable with amputation, no cervical  lymphadenopathy Cardiovascular: RRR, No MRG Respiratory: CTA B Gastrointestinal: abdomen soft, NT, ND, BS+ Musculoskeletal: no deformities, strength intact in all 4 Skin: moist, warm, no rashes Neurological: no tremor with outstretched hands, DTR normal in all 4  ASSESSMENT: 1.  Toxic thyroid nodule  PLAN:  1. Patient with a relatively recent diagnosis of thyrotoxicosis (low TSH x1 with normal free thyroid hormones), with several thyrotoxic symptoms: Heat intolerance, palpitations, insomnia, without weight loss, tremors, anxiety, hyper defecation. 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  She had this 1 month ago. -She feels much better after the RAI treatment, with resolved heat intolerance, palpitations, insomnia.  She also feels that the nodule size has decreased after RAI treatment. -Of note, she had a biopsy of the thyroid nodule which was inconclusive, however, the Afirma molecular marker test result was benign.  We did discuss that this carries a risk of thyroid cancer approximately 4%, which is very low. -At this visit, her pulse is normal and she does not have tremors or other symptoms and signs of thyrotoxicosis -We will recheck her TFTs today we discussed about possible scenarios: If her tests are normal, we will need to keep an eye on them.  If her tests are in the hypothyroid range, we may need levothyroxine.  We discussed about how to take this correctly in case she needs to start this. -I will see her back in 6 months, but most likely sooner for labs  Office Visit on 04/17/2020  Component Date Value  Ref Range Status  . T3, Free 04/17/2020 3.1  2.3 - 4.2 pg/mL Final  . Free T4 04/17/2020 0.61  0.60 - 1.60 ng/dL Final   Comment: Specimens from patients who are undergoing biotin therapy and /or ingesting biotin supplements may contain high levels of biotin.  The higher biotin concentration in these  specimens interferes with this Free T4 assay.  Specimens that contain high levels  of biotin may cause false high results for this Free T4 assay.  Please interpret results in light of the total clinical presentation of the patient.    Marland Kitchen TSH 04/17/2020 4.13  0.35 - 4.50 uIU/mL Final   Labs are normal, but TSH is close to the upper limit of normal.  I would like to repeat the test in 1.5 months but no intervention needed for now.  Carlus Pavlov, MD PhD [facility]

## 2020-05-02 ENCOUNTER — Ambulatory Visit: Payer: Managed Care, Other (non HMO) | Attending: Internal Medicine

## 2020-05-02 DIAGNOSIS — Z23 Encounter for immunization: Secondary | ICD-10-CM

## 2020-05-02 NOTE — Progress Notes (Signed)
   Covid-19 Vaccination Clinic  Name:  Audrey Stafford    MRN: [id number] DOB: 1980/10/28  05/02/2020  [name] was observed post Covid-19 immunization for 15 minutes without incident. She was provided with Vaccine Information Sheet and instruction to access the V-Safe system.   [name] was instructed to call 911 with any severe reactions post vaccine: Marland Kitchen Difficulty breathing  . Swelling of face and throat  . A fast heartbeat  . A bad rash all over body  . Dizziness and weakness   Immunizations Administered    Name Date Dose VIS Date Route   Pfizer COVID-19 Vaccine 05/02/2020 11:07 AM 0.3 mL 12/18/2018 Intramuscular   Manufacturer: [employer], [employer]   Lot: [id number]   NDC: [address]-3

## 2020-05-20 ENCOUNTER — Encounter: Payer: Self-pay | Admitting: Obstetrics and Gynecology

## 2020-05-20 ENCOUNTER — Telehealth: Payer: Self-pay | Admitting: Obstetrics and Gynecology

## 2020-05-20 ENCOUNTER — Other Ambulatory Visit: Payer: Self-pay

## 2020-05-20 ENCOUNTER — Ambulatory Visit: Payer: Managed Care, Other (non HMO) | Admitting: Obstetrics and Gynecology

## 2020-05-20 VITALS — BP 132/70 | HR 70 | Temp 98.0°F | Resp 16 | Ht 66.0 in | Wt 222.4 lb

## 2020-05-20 DIAGNOSIS — Z0000 Encounter for general adult medical examination without abnormal findings: Secondary | ICD-10-CM | POA: Diagnosis not present

## 2020-05-20 NOTE — Telephone Encounter (Signed)
Audrey Stafford La'Von  [facility] Good morning,   I was removing my tampon this morning and noticed that the top part was missing. I am unsure if it was already missing before it was inserted or if it came off while inside my vagina. Should I be concerned since I know infections can occur or should I wait and allow it to come out by itself even though I am unsure if it is in there?   Thank you,  Audrey Stafford

## 2020-05-20 NOTE — Progress Notes (Signed)
GYNECOLOGY  VISIT   HPI: 39 y.o.   Significant Other Black or African American Not Hispanic or Latino  female   G1P1001 with No LMP recorded. (Menstrual status: IUD).   here for possible partial retained tampon.  When she took the tampon out it looked like part of it was missing.     GYNECOLOGIC HISTORY: No LMP recorded. (Menstrual status: IUD). Contraception:IUD Menopausal hormone therapy: none        OB History    Gravida  1   Para  1   Term  1   Preterm      AB      Living  1     SAB      TAB      Ectopic      Multiple      Live Births  1              Patient Active Problem List   Diagnosis Date Noted  . Migraine without aura and without status migrainosus, not intractable 12/23/2019  . Vitamin D deficiency 04/09/2019  . Class 1 obesity with serious comorbidity and body mass index (BMI) of 30.0 to 30.9 in adult 02/12/2019  . Hyperlipidemia 03/11/2013    Past Medical History:  Diagnosis Date  . Anemia   . Arthritis    Knees  . Back pain   . Chest pain   . Constipation   . Dysmenorrhea   . GERD (gastroesophageal reflux disease)   . Hypertension   . Joint pain   . Migraine without aura   . Multiple food allergies    Milk  . Palpitations     Past Surgical History:  Procedure Laterality Date  . CESAREAN SECTION  01/11/2005    Current Outpatient Medications  Medication Sig Dispense Refill  . atenolol (TENORMIN) 25 MG tablet Take 25 mg by mouth daily.    . baclofen (LIORESAL) 10 MG tablet Take 1-2 tablets by mouth three times daily as needed    . buPROPion (WELLBUTRIN SR) 150 MG 12 hr tablet Take 1 tablet (150 mg total) by mouth daily. 30 tablet 0  . naproxen sodium (ANAPROX DS) 550 MG tablet Take 1 tablet (550 mg total) by mouth 2 (two) times daily with a meal. 30 tablet 2  . predniSONE (DELTASONE) 50 MG tablet Take 1 tablet (50 mg total) by mouth daily as needed. Take if needed for food allergy 30 tablet 0  . Vitamin D, Ergocalciferol,  (DRISDOL) 1.25 MG (50000 UNIT) CAPS capsule Take 1 capsule (50,000 Units total) by mouth every 7 (seven) days. 12 capsule 0   No current facility-administered medications for this visit.     ALLERGIES: Milk-related compounds  Family History  Problem Relation Age of Onset  . Sudden Cardiac Death Mother        MI  . Breast cancer Paternal Aunt        3  . Hypertension Maternal Grandmother   . Diabetes Maternal Grandmother   . Hypertension Maternal Aunt     Social History   Socioeconomic History  . Marital status: Significant Other    Spouse name: Henderson Cloud  . Number of children: 1  . Years of education: Not on file  . Highest education level: Not on file  Occupational History  . Occupation: Nurse, children's   Tobacco Use  . Smoking status: Never Smoker  . Smokeless tobacco: Never Used  Vaping Use  . Vaping Use: Never used  Substance and  Sexual Activity  . Alcohol use: No  . Drug use: No  . Sexual activity: Yes    Partners: Male    Birth control/protection: Pill  Other Topics Concern  . Not on file  Social History Narrative   She works at [employer].    Not married    1 child ( boy)   Social Determinants of Corporate investment banker Strain:   . Difficulty of Paying Living Expenses:   Food Insecurity:   . Worried About Programme researcher, broadcasting/film/video in the Last Year:   . Barista in the Last Year:   Transportation Needs:   . Freight forwarder (Medical):   Marland Kitchen Lack of Transportation (Non-Medical):   Physical Activity:   . Days of Exercise per Week:   . Minutes of Exercise per Session:   Stress:   . Feeling of Stress :   Social Connections:   . Frequency of Communication with Friends and Family:   . Frequency of Social Gatherings with Friends and Family:   . Attends Religious Services:   . Active Member of Clubs or Organizations:   . Attends Banker Meetings:   Marland Kitchen Marital Status:   Intimate Partner Violence:   . Fear of Current or  Ex-Partner:   . Emotionally Abused:   Marland Kitchen Physically Abused:   . Sexually Abused:     Review of Systems  Constitutional: Negative.   HENT: Negative.   Eyes: Negative.   Respiratory: Negative.   Cardiovascular: Negative.   Gastrointestinal: Negative.   Genitourinary: Negative.   Musculoskeletal: Negative.   Skin: Negative.   Neurological: Negative.   Endo/Heme/Allergies: Negative.   Psychiatric/Behavioral: Negative.     PHYSICAL EXAMINATION:    BP (!) 132/70 (BP Location: Right Arm, Patient Position: Sitting, Cuff Size: Normal)   Pulse 70   Temp 98 F (36.7 C) (Oral)   Resp 16   Ht 5\' 6"  (1.676 m)   Wt (!) 222 lb 6.4 oz (100.9 kg)   SpO2 98%   BMI 35.90 kg/m     General appearance: alert, cooperative and appears stated age  Pelvic: External genitalia:  no lesions              Urethra:  normal appearing urethra with no masses, tenderness or lesions              Bartholins and Skenes: normal                 Vagina: normal appearing vagina with normal color and discharge, no lesions              Cervix: no lesions and IUD string missing (previously in place on ultrasound)              Bimanual Exam:  Uterus:  normal size, contour, position, consistency, mobility, non-tender              Adnexa: no mass, fullness, tenderness               Chaperone was present for exam.  ASSESSMENT Concern for retained tampon, normal exam IUD string missing, in place on ultrasound    PLAN Patient reassured

## 2020-05-20 NOTE — Telephone Encounter (Signed)
Spoke with patient. Patient reports she removed a tampon this morning that was placed last night before bed and the top of the tampon was missing. Denies any other symptoms, patient is concerned part of the tampon may be retained, unable to feel it. OV scheduled for 9:15am today with Dr. Oscar La, advised patient to come on to office now. Patient agreeable.   Last AEX 12/26/19  Routing to provider for final review. Patient is agreeable to disposition. Will close encounter.

## 2020-05-23 ENCOUNTER — Ambulatory Visit: Payer: Managed Care, Other (non HMO) | Attending: Internal Medicine

## 2020-05-23 DIAGNOSIS — Z23 Encounter for immunization: Secondary | ICD-10-CM

## 2020-05-23 NOTE — Progress Notes (Signed)
   Covid-19 Vaccination Clinic  Name:  Audrey Stafford    MRN: [id number] DOB: 26-Feb-1981  05/23/2020  Audrey Stafford was observed post Covid-19 immunization for 15 minutes without incident. She was provided with Vaccine Information Sheet and instruction to access the V-Safe system.   Audrey Stafford was instructed to call 911 with any severe reactions post vaccine: Marland Kitchen Difficulty breathing  . Swelling of face and throat  . A fast heartbeat  . A bad rash all over body  . Dizziness and weakness   Immunizations Administered    Name Date Dose VIS Date Route   Pfizer COVID-19 Vaccine 05/23/2020 10:04 AM 0.3 mL 12/18/2018 Intramuscular   Manufacturer: [employer], [employer]   Lot: [id number]   NDC: [address]-4

## 2020-07-15 ENCOUNTER — Other Ambulatory Visit: Payer: Self-pay | Admitting: Obstetrics and Gynecology

## 2020-07-15 ENCOUNTER — Other Ambulatory Visit: Payer: Self-pay | Admitting: *Deleted

## 2020-07-15 DIAGNOSIS — E559 Vitamin D deficiency, unspecified: Secondary | ICD-10-CM

## 2020-07-15 NOTE — Telephone Encounter (Signed)
Call to patient. Patient advised needs lab visit for f/u vitamin d level. Patient agreeable. Patient scheduled for 07-17-20 at 1545. Patient agreeable to date and time of appointment. Future order placed for labs. Advised patient would send message to pharmacy. Patient agreeable.   Encounter closed.

## 2020-07-17 ENCOUNTER — Other Ambulatory Visit (INDEPENDENT_AMBULATORY_CARE_PROVIDER_SITE_OTHER): Payer: Managed Care, Other (non HMO)

## 2020-07-17 ENCOUNTER — Other Ambulatory Visit: Payer: Self-pay

## 2020-07-17 DIAGNOSIS — E559 Vitamin D deficiency, unspecified: Secondary | ICD-10-CM

## 2020-07-17 DIAGNOSIS — Z0000 Encounter for general adult medical examination without abnormal findings: Secondary | ICD-10-CM

## 2020-07-18 LAB — VITAMIN D 25 HYDROXY (VIT D DEFICIENCY, FRACTURES): Vit D, 25-Hydroxy: 24 ng/mL — ABNORMAL LOW (ref 30.0–100.0)

## 2020-07-21 ENCOUNTER — Telehealth: Payer: Self-pay

## 2020-07-21 DIAGNOSIS — E559 Vitamin D deficiency, unspecified: Secondary | ICD-10-CM

## 2020-07-21 MED ORDER — VITAMIN D (ERGOCALCIFEROL) 1.25 MG (50000 UNIT) PO CAPS: 50000.0000 [IU] | ORAL_CAPSULE | ORAL | 0 refills | Status: DC

## 2020-07-21 NOTE — Telephone Encounter (Signed)
-----   Message from Romualdo Bolk, MD sent at 07/18/2020  4:10 PM EDT ----- The patient was prescribed 50,000 IU of vit D3 6 months ago for 3 months. Please check with her if she has been taking any vit d since then, or did she start the vit d late. If she hasn't been on anything for the last 12 weeks, then I would repeat the 3 month course of 50,000 IU of vit d 3 weekly for the next 12 weeks, then recheck her level again. She is going to need to be on vit d long term

## 2020-07-21 NOTE — Telephone Encounter (Signed)
Spoke with pt. Pt given results and recommendations per Dr Oscar La. Pt agreeable and verbalized understanding.  Pt states last took 1 month supply of Vit D from PCP in Aug. Pt agreeable to retake for 12 more weeks and repeat lab.  Vit D Level scheduled for 10/02/20 at 345 pm. Pt verbalized understanding to date and time of appt.  Rx sent to pharmacy on file #[email or web address].   Routing to Dr Oscar La for update Encounter closed.

## 2020-08-06 ENCOUNTER — Encounter: Payer: Self-pay | Admitting: Adult Health

## 2020-08-06 ENCOUNTER — Other Ambulatory Visit: Payer: Self-pay

## 2020-08-06 ENCOUNTER — Ambulatory Visit: Payer: Managed Care, Other (non HMO) | Admitting: Adult Health

## 2020-08-06 VITALS — BP 118/88 | HR 73 | Temp 98.2°F | Ht 66.0 in | Wt 223.2 lb

## 2020-08-06 DIAGNOSIS — E669 Obesity, unspecified: Secondary | ICD-10-CM | POA: Diagnosis not present

## 2020-08-06 DIAGNOSIS — Z6830 Body mass index (BMI) 30.0-30.9, adult: Secondary | ICD-10-CM

## 2020-08-06 MED ORDER — PHENTERMINE HCL 15 MG PO CAPS: 15.0000 mg | ORAL_CAPSULE | ORAL | 0 refills | Status: DC

## 2020-08-06 NOTE — Progress Notes (Signed)
Subjective:    Patient ID: Audrey Stafford, female    DOB: Jul 19, 1981, 39 y.o.   MRN: [id number]  HPI 39 year old female who  has a past medical history of Anemia, Arthritis, Back pain, Chest pain, Constipation, Dysmenorrhea, GERD (gastroesophageal reflux disease), Hypertension, Joint pain, Migraine without aura, Multiple food allergies, and Palpitations.  She presents to the office today for help with obesity.  In the past she has tried the weight loss clinic, weight watchers, and works on lifestyle modification such as heart healthy diet and exercise.  Unfortunately due to cost she had to drop out of the weight loss clinic.  She continues to go to the gym a few days a week and is trying to eat heart healthy.  She is not able to lose much weight.  She is wondering if she can try any medications to help her lose weight.  Wt Readings from Last 3 Encounters:  08/06/20 223 lb 3.2 oz (101.2 kg)  05/20/20 (!) 222 lb 6.4 oz (100.9 kg)  04/17/20 224 lb (101.6 kg)    Review of Systems See HPI   Past Medical History:  Diagnosis Date  . Anemia   . Arthritis    Knees  . Back pain   . Chest pain   . Constipation   . Dysmenorrhea   . GERD (gastroesophageal reflux disease)   . Hypertension   . Joint pain   . Migraine without aura   . Multiple food allergies    Milk  . Palpitations     Social History   Socioeconomic History  . Marital status: Significant Other    Spouse name: Henderson Cloud  . Number of children: 1  . Years of education: Not on file  . Highest education level: Not on file  Occupational History  . Occupation: Nurse, children's   Tobacco Use  . Smoking status: Never Smoker  . Smokeless tobacco: Never Used  Vaping Use  . Vaping Use: Never used  Substance and Sexual Activity  . Alcohol use: No  . Drug use: No  . Sexual activity: Yes    Partners: Male    Birth control/protection: Pill  Other Topics Concern  . Not on file  Social History  Narrative   She works at [employer].    Not married    1 child ( boy)   Social Determinants of Corporate investment banker Strain:   . Difficulty of Paying Living Expenses: Not on file  Food Insecurity:   . Worried About Programme researcher, broadcasting/film/video in the Last Year: Not on file  . Ran Out of Food in the Last Year: Not on file  Transportation Needs:   . Lack of Transportation (Medical): Not on file  . Lack of Transportation (Non-Medical): Not on file  Physical Activity:   . Days of Exercise per Week: Not on file  . Minutes of Exercise per Session: Not on file  Stress:   . Feeling of Stress : Not on file  Social Connections:   . Frequency of Communication with Friends and Family: Not on file  . Frequency of Social Gatherings with Friends and Family: Not on file  . Attends Religious Services: Not on file  . Active Member of Clubs or Organizations: Not on file  . Attends Banker Meetings: Not on file  . Marital Status: Not on file  Intimate Partner Violence:   . Fear of Current or Ex-Partner: Not on file  . Emotionally  Abused: Not on file  . Physically Abused: Not on file  . Sexually Abused: Not on file    Past Surgical History:  Procedure Laterality Date  . CESAREAN SECTION  01/11/2005    Family History  Problem Relation Age of Onset  . Sudden Cardiac Death Mother        MI  . Breast cancer Paternal Aunt        3  . Hypertension Maternal Grandmother   . Diabetes Maternal Grandmother   . Hypertension Maternal Aunt     Allergies  Allergen Reactions  . Milk-Related Compounds Swelling    Current Outpatient Medications on File Prior to Visit  Medication Sig Dispense Refill  . atenolol (TENORMIN) 25 MG tablet Take 25 mg by mouth daily.    Marland Kitchen buPROPion (WELLBUTRIN SR) 150 MG 12 hr tablet Take 1 tablet (150 mg total) by mouth daily. 30 tablet 0  . levonorgestrel (MIRENA) 20 MCG/24HR IUD 1 each by Intrauterine route once.    . naproxen sodium (ANAPROX DS) 550 MG tablet  Take 1 tablet (550 mg total) by mouth 2 (two) times daily with a meal. 30 tablet 2  . predniSONE (DELTASONE) 50 MG tablet Take 1 tablet (50 mg total) by mouth daily as needed. Take if needed for food allergy 30 tablet 0  . Vitamin D, Ergocalciferol, (DRISDOL) 1.25 MG (50000 UNIT) CAPS capsule Take 1 capsule (50,000 Units total) by mouth every 7 (seven) days. 12 capsule 0   No current facility-administered medications on file prior to visit.    BP 118/88 (BP Location: Left Arm, Patient Position: Sitting, Cuff Size: Large)   Pulse 73   Temp 98.2 F (36.8 C) (Oral)   Ht 5\' 6"  (1.676 m)   Wt 223 lb 3.2 oz (101.2 kg)   BMI 36.03 kg/m       Objective:   Physical Exam Vitals and nursing note reviewed.  Constitutional:      Appearance: Normal appearance.  Cardiovascular:     Rate and Rhythm: Normal rate and regular rhythm.     Pulses: Normal pulses.     Heart sounds: Normal heart sounds.  Pulmonary:     Effort: Pulmonary effort is normal.     Breath sounds: Normal breath sounds.  Musculoskeletal:        General: Normal range of motion.  Skin:    General: Skin is warm and dry.  Neurological:     General: No focal deficit present.     Mental Status: She is oriented to person, place, and time.  Psychiatric:        Mood and Affect: Mood normal.        Behavior: Behavior normal.        Thought Content: Thought content normal.        Judgment: Judgment normal.       Assessment & Plan:  1. Class 1 obesity with serious comorbidity and body mass index (BMI) of 30.0 to 30.9 in adult, unspecified obesity type -We discussed various medications.  We will trial her on phentermine 15 mg.  She understands that she needs to continue to work on lifestyle modification such as a heart healthy diet and exercise.  Try and keep calories to under 1800/day.  Side effects of medication were reviewed.  She will follow-up in 1 month - phentermine 15 MG capsule; Take 1 capsule (15 mg total) by mouth every  morning.  Dispense: 30 capsule; Refill: 0  , NP

## 2020-09-04 ENCOUNTER — Ambulatory Visit: Payer: Managed Care, Other (non HMO) | Admitting: Adult Health

## 2020-10-02 ENCOUNTER — Other Ambulatory Visit (INDEPENDENT_AMBULATORY_CARE_PROVIDER_SITE_OTHER): Payer: Managed Care, Other (non HMO)

## 2020-10-02 ENCOUNTER — Other Ambulatory Visit: Payer: Self-pay

## 2020-10-02 ENCOUNTER — Other Ambulatory Visit: Payer: Self-pay | Admitting: *Deleted

## 2020-10-02 DIAGNOSIS — E559 Vitamin D deficiency, unspecified: Secondary | ICD-10-CM

## 2020-10-06 LAB — VITAMIN D 25 HYDROXY (VIT D DEFICIENCY, FRACTURES): Vit D, 25-Hydroxy: 30.7 ng/mL (ref 30.0–100.0)

## 2020-10-20 ENCOUNTER — Other Ambulatory Visit: Payer: Self-pay

## 2020-10-20 ENCOUNTER — Encounter: Payer: Self-pay | Admitting: Internal Medicine

## 2020-10-20 ENCOUNTER — Ambulatory Visit: Payer: Managed Care, Other (non HMO) | Admitting: Internal Medicine

## 2020-10-20 VITALS — BP 128/88 | HR 71 | Ht 66.0 in | Wt 224.6 lb

## 2020-10-20 DIAGNOSIS — E890 Postprocedural hypothyroidism: Secondary | ICD-10-CM

## 2020-10-20 DIAGNOSIS — E0510 Thyrotoxicosis with toxic single thyroid nodule without thyrotoxic crisis or storm: Secondary | ICD-10-CM | POA: Diagnosis not present

## 2020-10-20 LAB — T3, FREE: T3, Free: 3.4 pg/mL (ref 2.3–4.2)

## 2020-10-20 LAB — T4, FREE: Free T4: 0.62 ng/dL (ref 0.60–1.60)

## 2020-10-20 LAB — TSH: TSH: 7.53 u[IU]/mL — ABNORMAL HIGH (ref 0.35–4.50)

## 2020-10-20 NOTE — Patient Instructions (Signed)
Please stop at the lab.  If we need to start levothyroxine, please take the thyroid hormone every day, with water, at least 30 minutes before breakfast, separated by at least 4 hours from: - acid reflux medications - calcium - iron - multivitamins  Please come back for a follow-up appointment in 6 months.

## 2020-10-20 NOTE — Progress Notes (Signed)
Patient ID: Audrey Stafford, female   DOB: Dec 03, 1980, 39 y.o.   MRN: [id number]   This visit occurred during the SARS-CoV-2 public health emergency.  Safety protocols were in place, including screening questions prior to the visit, additional usage of staff PPE, and extensive cleaning of exam room while observing appropriate contact time as indicated for disinfecting solutions.   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  Last visit 6 months ago.  Reviewed her TFTs: Lab Results  Component Value Date   TSH 4.13 04/17/2020   TSH <0.01 (L) 01/08/2020   TSH 0.05 (L) 10/08/2019   TSH 0.878 11/15/2018   TSH 1.48 11/16/2017   TSH 2.02 07/04/2017   TSH 0.57 08/21/2015   TSH 1.15 08/18/2014   TSH 0.843 03/08/2013   FREET4 0.61 04/17/2020   FREET4 0.94 01/08/2020   FREET4 0.81 10/08/2019   FREET4 0.96 11/15/2018   T3FREE 3.1 04/17/2020   T3FREE 4.8 (H) 01/08/2020   T3FREE 4.2 10/08/2019  07/23/2009: TSH 1.11  Her antithyroid antibodies were not elevated: 11/25/2019: TRAb 1.05 (<2) Lab Results  Component Value Date   TSI <89 01/08/2020   Reviewed previous imaging test and biopsy results: Thyroid U/S (11/17/2017): 1.6 x 1.4 x 1.2 cm solid hypoechoic left mid lobe nodule      FNA (02/22/20219):  Left thyroid nodule, Fine Needle Aspiration II (smears): Benign thyroid nodule (Bethesda category 2). Specimen Adequacy:Satisfactory for evaluation.  Thyroid U/S (10/14/2019): 3.7 x 2.6 x 2.2 cm solid, isoechoic nodule, significantly increased from last ultrasound Nodule # 1: Prior biopsy: No Location: Left; Mid Maximum size: 3.7 cm; Other 2 dimensions: 2.6 x 2.2 cm, previously, 1.6 x 1.4 x 1.2 cm Composition: solid/almost completely solid (2) Echogenicity: isoechoic (1) Significant change in size (>/= 20% in two dimensions and minimal increase of 2  mm): Yes **Given size (>/= 2.5 cm) and appearance, fine needle aspiration of this mildly suspicious nodule should be considered based on TI-RADS Criteria.      FNA (11/05/2019): Atypia of unknown significance (Bethesda category 3) Afirma: benign  Thyroid uptake and scan (02/13/2020):  There is moderate, slightly heterogeneous uptake within the dominant left thyroid nodule demonstrated by ultrasound. There is relative suppression of activity throughout the remainder of the thyroid gland.  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 Relative suppression of activity in the remainder of the thyroid gland.  RAI treatment (03/16/2020)  At last visit, she mentioned insomnia, heat intolerance, palpitations.  All of these resolved after the RAI treatment.  Pt does not have a FH of thyroid ds. + FH of RA in MGM. No FH of thyroid cancer. No h/o radiation tx to head or neck.  No seaweed or kelp. No recent contrast studies. No herbal supplements. No Biotin use. No recent steroids use, but use this in the past for allergies.   Pt denies: - feeling nodules in neck - hoarseness - dysphagia - choking - SOB with lying down She tells me that she is not able to see her thyroid nodule in the mirror anymore.  She also has a history of HTN, HL  ROS: Constitutional: no weight gain/no weight loss, no fatigue, no subjective hyperthermia, no subjective hypothermia Eyes: no blurry vision, no xerophthalmia ENT: no  sore throat, + see HPI Cardiovascular: no CP/no SOB/no palpitations/no leg swelling Respiratory: no cough/no SOB/no wheezing Gastrointestinal: no N/no V/no D/no C/no acid reflux Musculoskeletal: no muscle aches/+ joint aches Skin: no rashes, no hair loss Neurological: no tremors/no numbness/no tingling/no dizziness, + migraines (goes to  the headache clinic-on baclofen)  I reviewed pt's medications, allergies, PMH, social hx, family hx, and changes were documented in the history of present illness. Otherwise, unchanged from my initial visit note.  Past Medical History:  Diagnosis Date  . Anemia   . Arthritis    Knees  . Back pain   . Chest pain   . Constipation   . Dysmenorrhea   . GERD (gastroesophageal reflux disease)   . Hypertension   . Joint pain   . Migraine without aura   . Multiple food allergies    Milk  . Palpitations    Past Surgical History:  Procedure Laterality Date  . CESAREAN SECTION  01/11/2005   Social History   Socioeconomic History  . Marital status: Significant Other    Spouse name: Henderson Cloud  . Number of children: 1  . Years of education: Not on file  . Highest education level: Not on file  Occupational History  . Occupation: Nurse, children's   Tobacco Use  . Smoking status: Never Smoker  . Smokeless tobacco: Never Used  Vaping Use  . Vaping Use: Never used  Substance and Sexual Activity  . Alcohol use: No  . Drug use: No  . Sexual activity: Yes    Partners: Male    Birth control/protection: Pill  Other Topics Concern  . Not on file  Social History Narrative   She works at [employer].    Not married    1 child ( boy)   Social Determinants of Corporate investment banker Strain: Not on file  Food Insecurity: Not on file  Transportation Needs: Not on file  Physical Activity: Not on file  Stress: Not on file  Social Connections: Not on file  Intimate Partner Violence: Not on file   Current Outpatient Medications on File Prior to Visit  Medication Sig Dispense Refill  . atenolol (TENORMIN) 25 MG tablet Take 25 mg by mouth daily.    Marland Kitchen buPROPion (WELLBUTRIN SR) 150 MG 12 hr tablet Take 1 tablet (150 mg total) by mouth daily. 30 tablet 0  . levonorgestrel (MIRENA) 20 MCG/24HR IUD 1 each by Intrauterine route once.    . naproxen sodium (ANAPROX DS) 550 MG  tablet Take 1 tablet (550 mg total) by mouth 2 (two) times daily with a meal. 30 tablet 2  . phentermine 15 MG capsule Take 1 capsule (15 mg total) by mouth every morning. 30 capsule 0  . predniSONE (DELTASONE) 50 MG tablet Take 1 tablet (50 mg total) by mouth daily as needed. Take if needed for food allergy 30 tablet 0  . Vitamin D, Ergocalciferol, (DRISDOL) 1.25 MG (50000 UNIT) CAPS capsule Take 1 capsule (50,000 Units total) by mouth every 7 (seven) days. 12 capsule 0   No current facility-administered medications on file prior to visit.   Allergies  Allergen Reactions  . Milk-Related Compounds Swelling   Family History  Problem Relation Age of Onset  . Sudden Cardiac Death Mother        MI  . Breast cancer Paternal Aunt        3  . Hypertension Maternal Grandmother   . Diabetes Maternal Grandmother   . Hypertension Maternal Aunt  PE: BP 128/88   Pulse 71   Ht 5\' 6"  (1.676 m)   Wt 224 lb 9.6 oz (101.9 kg)   SpO2 97%   BMI 36.25 kg/m  Wt Readings from Last 3 Encounters:  10/20/20 224 lb 9.6 oz (101.9 kg)  08/06/20 223 lb 3.2 oz (101.2 kg)  05/20/20 (!) 222 lb 6.4 oz (100.9 kg)   Constitutional: overweight, in NAD Eyes: PERRLA, EOMI, no exophthalmos ENT: moist mucous membranes, no thyromegaly and NO thyroid nodule palpated, no cervical lymphadenopathy Cardiovascular: RRR, No MRG Respiratory: CTA B Gastrointestinal: abdomen soft, NT, ND, BS+ Musculoskeletal: no deformities, strength intact in all 4 Skin: moist, warm, no rashes Neurological: no tremor with outstretched hands, DTR normal in all 4  ASSESSMENT: 1.  Toxic thyroid nodule  PLAN:  1. Patient with history of thyrotoxicosis (low TSH x2), with several thyrotoxic symptoms: Heat intolerance, palpitations, insomnia, but without weight loss, tremors, anxiety, hyper defecation. -She had a large left thyroid nodule on ultrasound and this was also palpable on exam.  We checked a thyroid uptake and scan and this  showed that this thyroid nodule was in fact a toxic adenoma.  We then proceeded with RAI treatment, which she had approximately 1 month before our last visit.  She felt much better after the treatment, with improved heat intolerance, palpitations, insomnia.  She also felt that the nodule size has decreased after the RAI treatment. -Of note, she had a previous biopsy of this thyroid nodule, which was inconclusive, however, the Afirma molecular marker returned benign, carrying risk of thyroid cancer of approximately 4%, which is very low -At last visit, in 03/2020, we checked her TFTs and they were normal, but we discussed that they could become hypothyroid.  We discussed about having her come back for labs afterwards, but she did not return. -At today's visit, she has no complaints -We will check her TFTs and discussed about the possibility of starting levothyroxine after the results are back. -I will see her in 6 months, but possibly sooner depending on the results today.  Component     Latest Ref Rng & Units 10/20/2020  TSH     0.35 - 4.50 uIU/mL 7.53 (H)  T4,Free(Direct)     0.60 - 1.60 ng/dL 10/22/2020  Triiodothyronine,Free,Serum     2.3 - 4.2 pg/mL 3.4  TSH is now in the hypothyroid range.  I will advise her to start 50 mcg of levothyroxine daily and we will recheck her tests in 1.5 months.  0.10, MD PhD [facility]

## 2020-10-21 MED ORDER — LEVOTHYROXINE SODIUM 50 MCG PO TABS: 50.0000 ug | ORAL_TABLET | Freq: Every day | ORAL | 3 refills | Status: DC

## 2020-12-11 ENCOUNTER — Other Ambulatory Visit (INDEPENDENT_AMBULATORY_CARE_PROVIDER_SITE_OTHER): Payer: Managed Care, Other (non HMO)

## 2020-12-11 ENCOUNTER — Other Ambulatory Visit: Payer: Self-pay

## 2020-12-11 DIAGNOSIS — E890 Postprocedural hypothyroidism: Secondary | ICD-10-CM | POA: Diagnosis not present

## 2020-12-11 LAB — T4, FREE: Free T4: 0.76 ng/dL (ref 0.60–1.60)

## 2020-12-11 LAB — TSH: TSH: 2.47 u[IU]/mL (ref 0.35–4.50)

## 2020-12-28 ENCOUNTER — Other Ambulatory Visit: Payer: Self-pay

## 2020-12-28 ENCOUNTER — Encounter: Payer: Self-pay | Admitting: Obstetrics and Gynecology

## 2020-12-28 ENCOUNTER — Ambulatory Visit: Payer: Managed Care, Other (non HMO) | Admitting: Obstetrics and Gynecology

## 2020-12-28 VITALS — BP 128/72 | HR 69 | Ht 66.0 in | Wt 221.8 lb

## 2020-12-28 DIAGNOSIS — E559 Vitamin D deficiency, unspecified: Secondary | ICD-10-CM

## 2020-12-28 DIAGNOSIS — Z803 Family history of malignant neoplasm of breast: Secondary | ICD-10-CM | POA: Diagnosis not present

## 2020-12-28 DIAGNOSIS — Z01419 Encounter for gynecological examination (general) (routine) without abnormal findings: Secondary | ICD-10-CM | POA: Diagnosis not present

## 2020-12-28 DIAGNOSIS — N946 Dysmenorrhea, unspecified: Secondary | ICD-10-CM

## 2020-12-28 DIAGNOSIS — Z30431 Encounter for routine checking of intrauterine contraceptive device: Secondary | ICD-10-CM

## 2020-12-28 DIAGNOSIS — Z6835 Body mass index (BMI) 35.0-35.9, adult: Secondary | ICD-10-CM

## 2020-12-28 MED ORDER — NAPROXEN SODIUM 550 MG PO TABS: 550.0000 mg | ORAL_TABLET | Freq: Two times a day (BID) | ORAL | 2 refills | Status: DC

## 2020-12-28 NOTE — Progress Notes (Signed)
40 y.o. G1P1001 Significant Other Black or African American Not Hispanic or Latino female here for annual exam.  Left shoulder if slightly larger than the other.  She has a mirena IUD, placed in 6/19. Strings were lost at f/u visit, in place on ultrasound in 7/19. Just occasional cycles with the IUD, may just bleed for a day or two. No dyspareunia.  Occasional cramps, takes Anaprox as needed.  Period Duration (Days): 5 Period Pattern: (!) Irregular Menstrual Flow: Light Menstrual Control: Thin pad,Tampon Menstrual Control Change Freq (Hours): 4 Dysmenorrhea: (!) Mild Dysmenorrhea Symptoms: Cramping,Headache   She has a strong FH of breast cancer on her Fathers side, referral to genetics was placed last year.   H/O vit d def, on 5,000 IU daily since 12/21 (down from 50,000 weekly).  H/O thyroid nodule, on synthroid.   Patient's last menstrual period was 12/09/2020.          Sexually active: Yes.   Lives with her long term partner.  The current method of family planning is IUD.    Exercising: Yes.    Cardio Smoker:  no  Health Maintenance: Pap:  11/02/17 Neg HR HPV Neg, 03/08/13 Neg  History of abnormal Pap:  no MMG:  NONE  BMD:   None  Colonoscopy: none  TDaP:  11/01/17  Gardasil: Complete   reports that she has never smoked. She has never used smokeless tobacco. She reports that she does not drink alcohol and does not use drugs. Son is 15. She works for [employer]  Past Medical History:  Diagnosis Date  . Anemia   . Arthritis    Knees  . Back pain   . Chest pain   . Constipation   . Dysmenorrhea   . GERD (gastroesophageal reflux disease)   . Hypertension   . Joint pain   . Migraine without aura   . Multiple food allergies    Milk  . Palpitations     Past Surgical History:  Procedure Laterality Date  . CESAREAN SECTION  01/11/2005    Current Outpatient Medications  Medication Sig Dispense Refill  . atenolol (TENORMIN) 25 MG tablet Take 25 mg by mouth daily.    Marland Kitchen  buPROPion (WELLBUTRIN SR) 150 MG 12 hr tablet Take 1 tablet (150 mg total) by mouth daily. 30 tablet 0  . levonorgestrel (MIRENA) 20 MCG/24HR IUD 1 each by Intrauterine route once.    Marland Kitchen levothyroxine (SYNTHROID) 50 MCG tablet Take 1 tablet (50 mcg total) by mouth daily. 45 tablet 3  . naproxen sodium (ANAPROX DS) 550 MG tablet Take 1 tablet (550 mg total) by mouth 2 (two) times daily with a meal. 30 tablet 2   No current facility-administered medications for this visit.    Family History  Problem Relation Age of Onset  . Sudden Cardiac Death Mother        MI  . Breast cancer Paternal Aunt        3  . Hypertension Maternal Grandmother   . Diabetes Maternal Grandmother   . Hypertension Maternal Aunt     Review of Systems  All other systems reviewed and are negative.   Exam:   BP 128/72   Pulse 69   Ht 5\' 6"  (1.676 m)   Wt 221 lb 12.8 oz (100.6 kg)   LMP 12/09/2020   SpO2 99%   BMI 35.80 kg/m   Weight change: @WEIGHTCHANGE @ Height:   Height: 5\' 6"  (167.6 cm)  Ht Readings from Last 3 Encounters:  12/28/20 5\' 6"  (1.676 m)  10/20/20 5\' 6"  (1.676 m)  08/06/20 5\' 6"  (1.676 m)    General appearance: alert, cooperative and appears stated age Head: Normocephalic, without obvious abnormality, atraumatic Neck: no adenopathy, supple, symmetrical, trachea midline and thyroid normal to inspection and palpation Lungs: clear to auscultation bilaterally Cardiovascular: regular rate and rhythm Breasts: normal appearance, no masses or tenderness Abdomen: soft, non-tender; non distended,  no masses,  no organomegaly Extremities: extremities normal, atraumatic, no cyanosis or edema Skin: Skin color, texture, turgor normal. No rashes or lesions Lymph nodes: Cervical, supraclavicular, and axillary nodes normal. No abnormal inguinal nodes palpated Neurologic: Grossly normal   Pelvic: External genitalia:  no lesions              Urethra:  normal appearing urethra with no masses, tenderness  or lesions              Bartholins and Skenes: normal                 Vagina: normal appearing vagina with normal color and discharge, no lesions              Cervix: no lesions               Bimanual Exam:  Uterus:  normal size, contour, position, consistency, mobility, non-tender and anteverted              Adnexa: no mass, fullness, tenderness               Rectovaginal: Confirms               Anus:  normal sphincter tone, no lesions  chaperoned for the exam.   1. Well woman exam Discussed breast self exam Discussed calcium and vit D intake Screening labs through work  2. Vitamin D deficiency - VITAMIN D 25 Hydroxy (Vit-D Deficiency, Fractures)  3. Family history of breast cancer - Ambulatory referral to Genetics  4. BMI 35.0-35.9,adult Exercising, trying to eat healthy  5. IUD check up Missing strings, in place on ultrasound.   6. Dysmenorrhea - naproxen sodium (ANAPROX DS) 550 MG tablet; Take 1 tablet (550 mg total) by mouth 2 (two) times daily with a meal.  Dispense: 30 tablet; Refill: 2

## 2020-12-28 NOTE — Patient Instructions (Signed)

## 2020-12-29 ENCOUNTER — Telehealth: Payer: Self-pay | Admitting: Genetic Counselor

## 2020-12-29 LAB — VITAMIN D 25 HYDROXY (VIT D DEFICIENCY, FRACTURES): Vit D, 25-Hydroxy: 16.1 ng/mL — ABNORMAL LOW (ref 30.0–100.0)

## 2020-12-29 NOTE — Telephone Encounter (Signed)
Received a genetic counseling referral from Dr. Oscar La for fhx of breast cancer. Pt has been cld and scheduled to see Irving Burton on 3/17 at 3pm for a virtual visit. Pt has an active mychart acct.

## 2020-12-30 MED ORDER — VITAMIN D (ERGOCALCIFEROL) 1.25 MG (50000 UNIT) PO CAPS: 50000.0000 [IU] | ORAL_CAPSULE | ORAL | 3 refills | Status: DC

## 2021-01-07 ENCOUNTER — Ambulatory Visit (HOSPITAL_BASED_OUTPATIENT_CLINIC_OR_DEPARTMENT_OTHER): Payer: Managed Care, Other (non HMO) | Admitting: Genetic Counselor

## 2021-01-07 DIAGNOSIS — Z803 Family history of malignant neoplasm of breast: Secondary | ICD-10-CM | POA: Diagnosis not present

## 2021-01-07 DIAGNOSIS — Z800 Family history of malignant neoplasm of digestive organs: Secondary | ICD-10-CM | POA: Diagnosis not present

## 2021-01-07 DIAGNOSIS — Z8042 Family history of malignant neoplasm of prostate: Secondary | ICD-10-CM | POA: Diagnosis not present

## 2021-01-13 ENCOUNTER — Encounter: Payer: Self-pay | Admitting: Genetic Counselor

## 2021-01-13 DIAGNOSIS — Z8042 Family history of malignant neoplasm of prostate: Secondary | ICD-10-CM | POA: Insufficient documentation

## 2021-01-13 DIAGNOSIS — Z803 Family history of malignant neoplasm of breast: Secondary | ICD-10-CM | POA: Insufficient documentation

## 2021-01-13 DIAGNOSIS — Z800 Family history of malignant neoplasm of digestive organs: Secondary | ICD-10-CM | POA: Insufficient documentation

## 2021-01-13 NOTE — Progress Notes (Signed)
REFERRING PROVIDER: Salvadore Dom, [place] [facility] [address],  [place] [address]  PRIMARY PROVIDER:  Dorothyann Peng, NP  PRIMARY REASON FOR VISIT:  1. Family history of breast cancer   2. Family history of throat cancer   3. Family history of prostate cancer      I connected with Ms. Witts on 01/07/2021 at 3:00 pm EDT by video conference and verified that I am speaking with the correct person using two identifiers.   Patient location: Home Provider location: [place] Office  HISTORY OF PRESENT ILLNESS:   Ms. Edinger, a 40 y.o. female, was seen for a [place] cancer genetics consultation at the request of Dr. Talbert Nan due to a family history of cancer.  Ms. Marlin presents to clinic today to discuss the possibility of a hereditary predisposition to cancer, genetic testing, and to further clarify her future cancer risks, as well as potential cancer risks for family members.   Ms. Wideman does not have a personal history of cancer.    RISK FACTORS:  Menarche was at age 55.  First live birth at age 41.  OCP use for approximately 22 years.  Ovaries intact: yes.  Hysterectomy: no.  Menopausal status: premenopausal.  HRT use: 0 years. Colonoscopy: no; not examined. Mammogram within the last year: no. Number of breast biopsies: 0. Any excessive radiation exposure in the past: RAI treatment for thyroid nodule 2021.   Past Medical History:  Diagnosis Date  . Anemia   . Arthritis    Knees  . Back pain   . Chest pain   . Constipation   . Dysmenorrhea   . Family history of breast cancer   . Family history of prostate cancer   . Family history of throat cancer   . GERD (gastroesophageal reflux disease)   . Hypertension   . Joint pain   . Migraine without aura   . Multiple food allergies    Milk  . Palpitations     Past Surgical History:  Procedure Laterality Date  . CESAREAN SECTION  01/11/2005    Social History   Socioeconomic  History  . Marital status: Significant Other    Spouse name: Atilano Ina  . Number of children: 1  . Years of education: Not on file  . Highest education level: Not on file  Occupational History  . Occupation: Engineer, building services   Tobacco Use  . Smoking status: Never Smoker  . Smokeless tobacco: Never Used  Vaping Use  . Vaping Use: Never used  Substance and Sexual Activity  . Alcohol use: No  . Drug use: No  . Sexual activity: Yes    Partners: Male    Birth control/protection: Pill  Other Topics Concern  . Not on file  Social History Narrative   She works at [employer].    Not married    1 child ( boy)   Social Determinants of Radio broadcast assistant Strain: Not on file  Food Insecurity: Not on file  Transportation Needs: Not on file  Physical Activity: Not on file  Stress: Not on file  Social Connections: Not on file     FAMILY HISTORY:  We obtained a detailed, 4-generation family history.  Significant diagnoses are listed below: Family History  Problem Relation Age of Onset  . Sudden Cardiac Death Mother        MI  . Breast cancer Paternal Aunt        dx >50,  unconfirmed diagnosis  . Hypertension Maternal Grandmother   . Diabetes Maternal Grandmother   . Hypertension Maternal Aunt   . Prostate cancer Maternal Uncle        dx 6s  . Throat cancer Paternal Aunt        d. >50, smoker  . Breast cancer Paternal Great-grandmother        PGF's mother   Ms. Yale has one son (age 32). She has one maternal half-brother (age 16). Neither of these relatives have had cancer.  Ms. Shevlin mother died at age 64 without cancer. There are two maternal aunts and two maternal uncles. One maternal uncle was diagnosed with prostate cancer in his 50s. There is no known cancer among maternal first cousins. Ms. Moxey maternal grandmother died at age 22 without cancer. Her maternal grandfather died older than 75 without cancer. A maternal first cousin once  removed (mother's maternal first cousin) was diagnosed with an unknown type of cancer in her 66s.  Ms. Sonntag father is alive at age 50 without cancer. There were two paternal aunts and four paternal uncles. One aunt died from throat cancer older than 59 and was a smoker. Another aunt may have been diagnosed with breast cancer older than 58, although Ms. Waltermire is unsure. There is no known cancer among paternal cousins. Ms. Vath paternal grandparents died older than 61 without cancer. Her paternal great-grandmother (PGF's mother) had breast cancer (age of diagnosis unknown).  Ms. Wahba is unaware of previous family history of genetic testing for hereditary cancer risks. Patient's ancestors are of Black/African American descent. There is no reported Ashkenazi Jewish ancestry. There is no known consanguinity.  GENETIC COUNSELING ASSESSMENT: Ms. Marken is a 40 y.o. female with a family history of breast cancer, throat cancer, and prostate cancer which is not suggestive of a hereditary cancer syndrome. We, therefore, discussed and recommended the following at today's visit.   DISCUSSION:  We discussed that, in general, most cancer is not inherited in families, but instead is sporadic or familial. Sporadic cancers occur by chance and typically happen at older ages (>50 years) as this type of cancer is caused by genetic changes acquired during an individual's lifetime. Some families have more cancers than would be expected by chance; however, the ages or types of cancer are not consistent with a known genetic mutation or known genetic mutations have been ruled out. This type of familial cancer is thought to be due to a combination of multiple genetic, environmental, hormonal, and lifestyle factors. While this combination of factors likely increases the risk of cancer, the exact source of this risk is not currently identifiable or testable.    We discussed that approximately 5-10% of breast cancer is  hereditary, meaning that it is due to a mutation in a single gene that is passed down from generation to generation in a family. Most hereditary cases of breast cancer are associated with the BRCA1 and BRCA2 genes, although there are other genes that can be associated with an increased risk for breast cancer. We discussed that identifying a hereditary cancer syndrome can be beneficial for several reasons,  including knowing about other cancer risks, identifying potential screening and risk-reduction options that may be appropriate, and to understand if other family members could be at risk for cancer and allow them to undergo genetic testing.   We reviewed the characteristics, features and inheritance patterns of hereditary cancer syndromes. We discussed with Ms. Crammer that the family history does not meet insurance or  NCCN criteria for genetic testing and, therefore, is not highly consistent with a familial hereditary cancer syndrome. We feel she is at low risk to harbor a gene mutation associated with such a condition. Thus, we did not recommend any genetic testing, at this time, and recommended Ms. Brosseau continue to follow the cancer screening guidelines given by her primary healthcare provider.  Based on the patient's personal and family history, a statistical model Midwife) was used to estimate her risk of developing breast cancer. Tyrer-Cuzick estimates her lifetime risk of developing breast cancer to be approximately 13% if her paternal aunt did have breast cancer, or 8.6% if her aunt did NOT have breast cancer. This estimation does not consider any genetic testing results. The patient's lifetime breast cancer risk is a preliminary estimate based on available information using one of several models endorsed by the [employer] (ACS). The ACS recommends consideration of breast MRI screening as an adjunct to mammography for patients at high risk (defined as 20% or greater lifetime  risk).    Tyrer-Cuzick if paternal aunt had breast cancer:   Tyrer-Cuzick if paternal aunt did NOT have breast cancer:    PLAN:  Ms. Spoerl did not proceed with genetic testing at today's visit. We remain available to coordinate genetic testing at any time in the future, if desired. We, therefore, recommend Ms. Carias continue to follow the cancer screening guidelines given by her primary healthcare provider.  Ms. Mcgrory questions were answered to her satisfaction today. Our contact information was provided should additional questions or concerns arise. Thank you for the referral and allowing [place] to share in the care of your patient.   Clint Guy, [place], [facility] Licensed, Certified Dispensing [email or web address] Phone: [phone]  The patient was seen for a total of 35 minutes in face-to-face genetic counseling.  This patient was discussed with Drs. Magrinat, Lindi Adie and/or Burr Medico who agrees with the above.    _______________________________________________________________________ For Office Staff:  Number of people involved in session: 1 Was an Intern/ student involved with case: no

## 2021-03-05 IMAGING — US US FNA BIOPSY THYROID 1ST LESION
1 series · 13 of 17 positions shown · non-contrast
Comparison: US 10/14/19

MEDICATIONS:
10 cc 1% lidocaine

COMPLICATIONS:
None immediate.

INDICATION: Indeterminate thyroid nodule

Left thyroid nodule
3.7 cm
EXAM:
ULTRASOUND GUIDED FINE NEEDLE ASPIRATION OF INDETERMINATE THYROID
NODULE
TECHNIQUE: Informed written consent was obtained from the patient after a
discussion of the risks, benefits and alternatives to treatment.
Questions regarding the procedure were encouraged and answered. A
timeout was performed prior to the initiation of the procedure.

[Series 1: us fna biopsy thyroid 1st lesion · 0.06mm/px · 17 acquisitions, 13 frames shown]
[im 1/17]
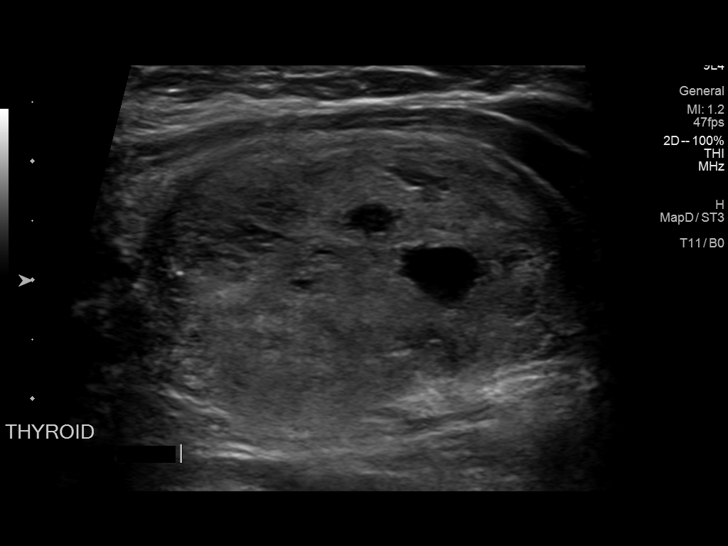
[im 2/17]
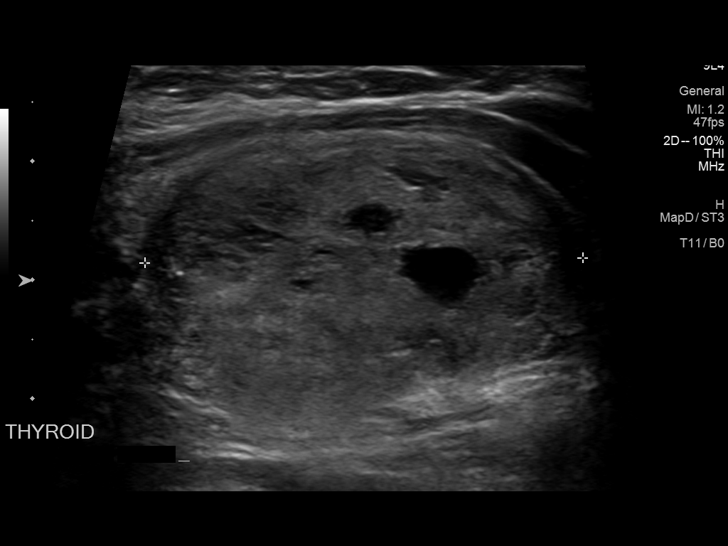
[im 4/17]
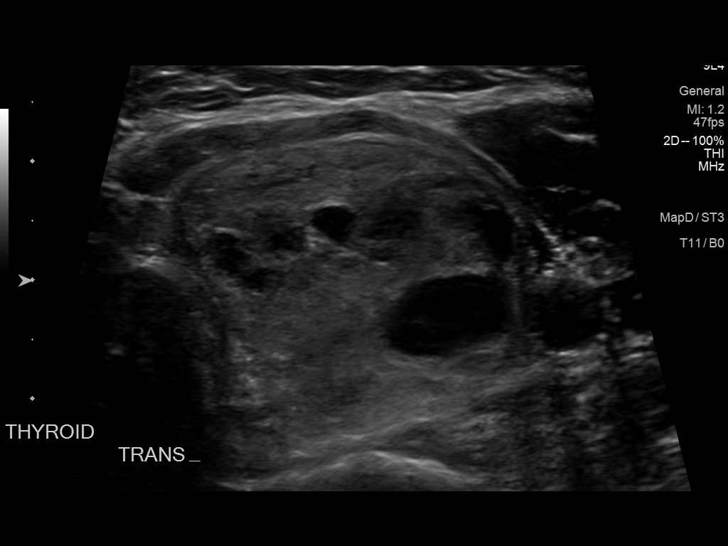
[im 5/17]
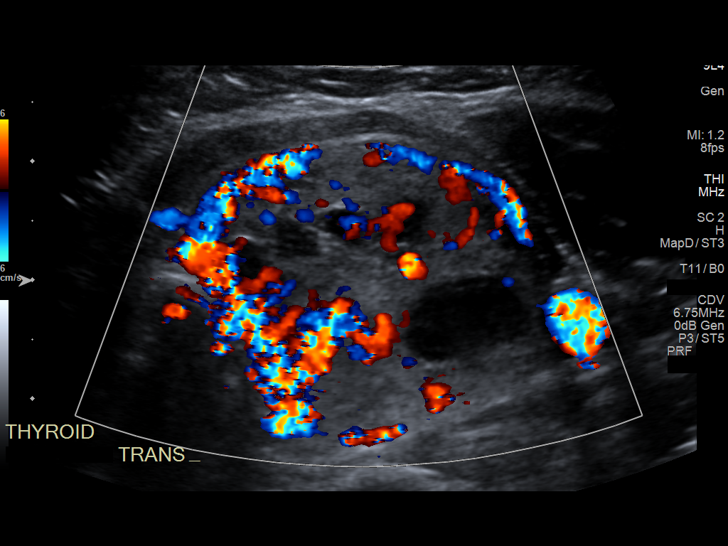
[im 6/17]
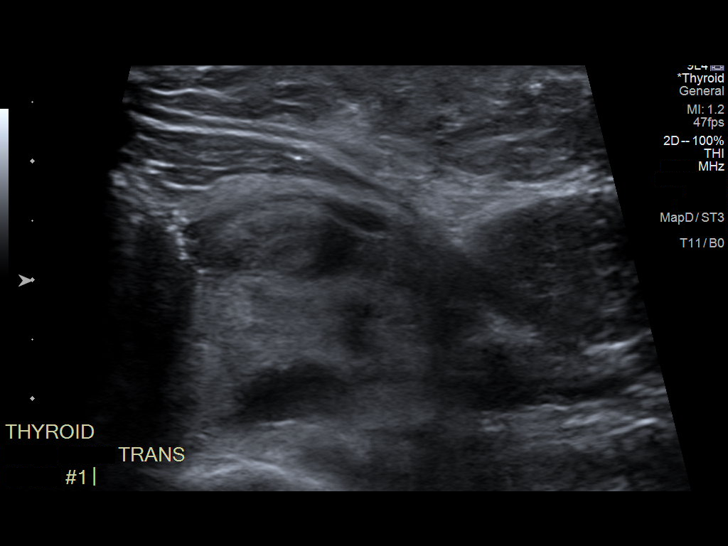
[im 8/17]
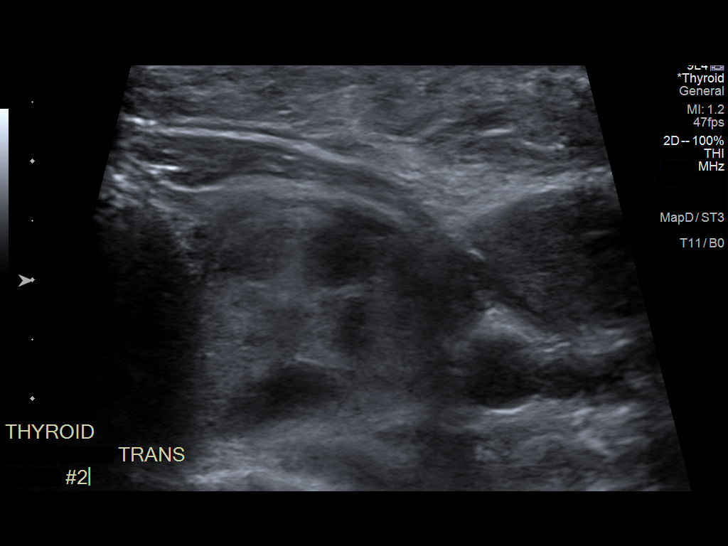
[im 9/17]
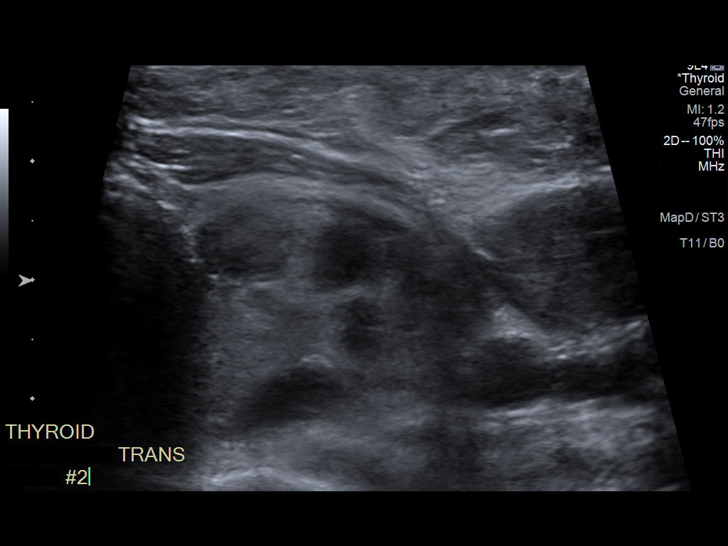
[im 10/17]
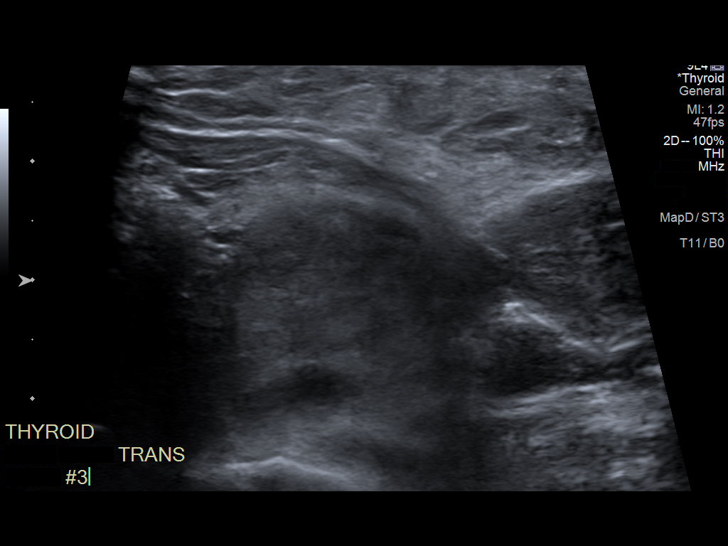
[im 12/17]
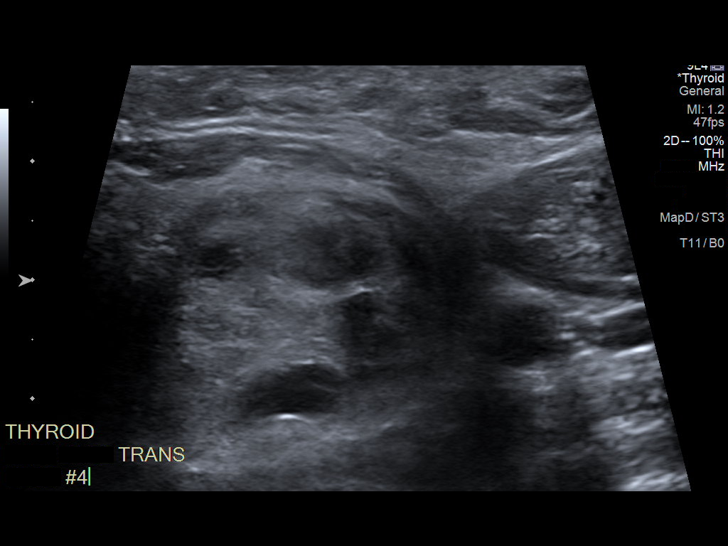
[im 13/17]
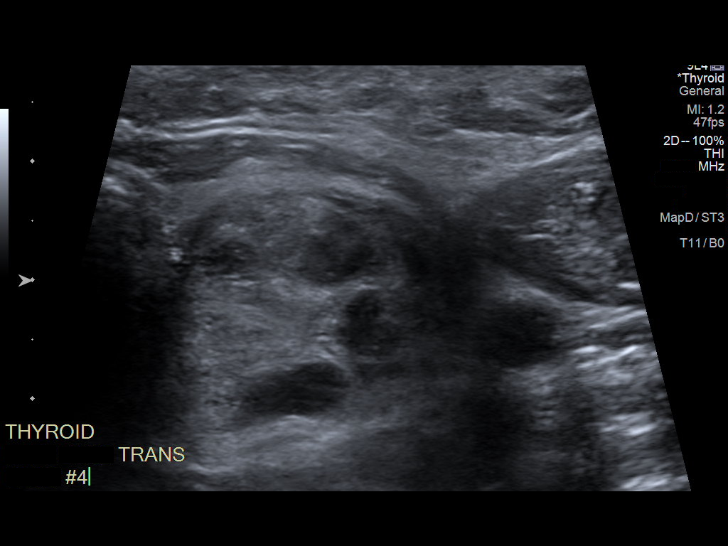
[im 14/17]
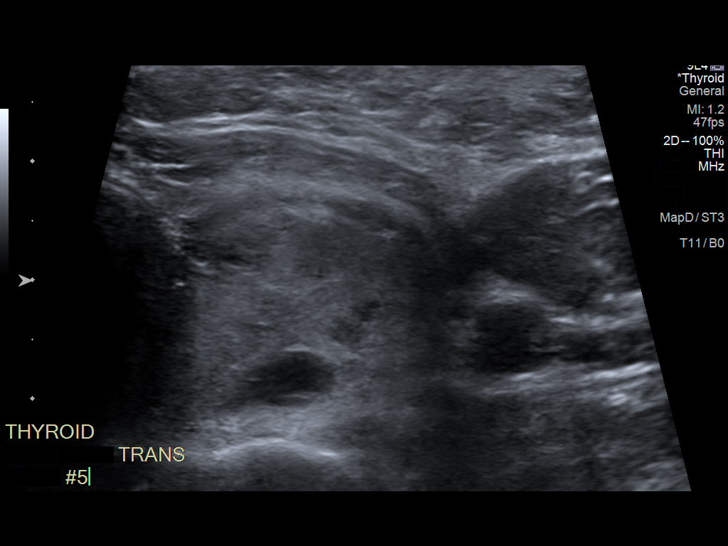
[im 16/17]
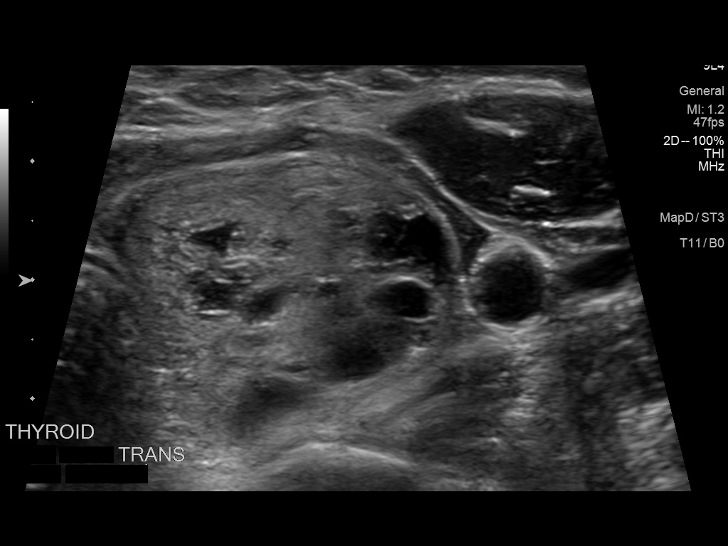
[im 17/17]
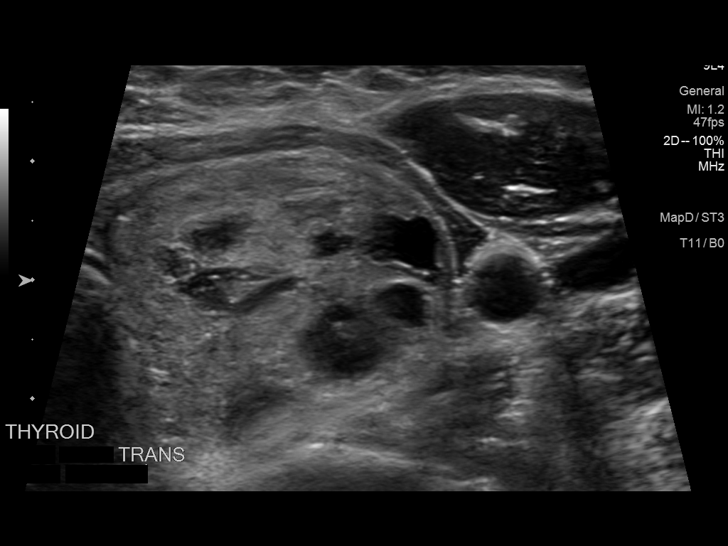

[13 of 17 positions shown; findings below may reference images not displayed]

Pre-procedural ultrasound scanning demonstrated unchanged size and
appearance of the indeterminate nodule within the left thyroid

The procedure was planned. The neck was prepped in the usual sterile
fashion, and a sterile drape was applied covering the operative
field. A timeout was performed prior to the initiation of the
procedure. Local anesthesia was provided with 1% lidocaine.

Under direct ultrasound guidance, 5 FNA biopsies were performed of
the left thyroid nodule with a 25 gauge needle.

2 of these samples were sent for AFIRMA per ordering FUZE

Multiple ultrasound images were saved for procedural documentation
purposes. The samples were prepared and submitted to pathology.

Limited post procedural scanning was negative for hematoma or
additional complication. Dressings were placed. The patient
tolerated the above procedures procedure well without immediate
postprocedural complication.
FINDINGS: Nodule reference number based on prior diagnostic ultrasound: 1

Maximum size: 3.7 cm

Location: Left; Mid

ACR TI-RADS risk category: TR3 (3 points)

Reason for biopsy: meets ACR TI-RADS criteria

Ultrasound imaging confirms appropriate placement of the needles
within the thyroid nodule.
IMPRESSION: Technically successful ultrasound guided fine needle aspiration of
left thyroid nodule

Read by

Nazareth Jumper

## 2021-04-20 ENCOUNTER — Ambulatory Visit: Payer: Managed Care, Other (non HMO) | Admitting: Internal Medicine

## 2021-05-11 ENCOUNTER — Other Ambulatory Visit: Payer: Self-pay

## 2021-05-11 ENCOUNTER — Ambulatory Visit: Payer: Managed Care, Other (non HMO) | Admitting: Adult Health

## 2021-05-11 ENCOUNTER — Encounter: Payer: Self-pay | Admitting: Adult Health

## 2021-05-11 VITALS — BP 128/88 | HR 77 | Temp 98.1°F | Ht 66.0 in | Wt 214.0 lb

## 2021-05-11 DIAGNOSIS — M542 Cervicalgia: Secondary | ICD-10-CM | POA: Diagnosis not present

## 2021-05-11 MED ORDER — CYCLOBENZAPRINE HCL 10 MG PO TABS: 10.0000 mg | ORAL_TABLET | Freq: Every day | ORAL | 0 refills | Status: DC

## 2021-05-11 MED ORDER — METHYLPREDNISOLONE 4 MG PO TBPK: ORAL_TABLET | ORAL | 0 refills | Status: DC

## 2021-05-11 NOTE — Telephone Encounter (Signed)
Pt has been scheduled.  °

## 2021-05-11 NOTE — Progress Notes (Signed)
Subjective:    Patient ID: Audrey Stafford, female    DOB: 03/17/1981, 40 y.o.   MRN: [id number]  HPI 40 year old female who  has a past medical history of Anemia, Arthritis, Back pain, Chest pain, Constipation, Dysmenorrhea, Family history of breast cancer, Family history of prostate cancer, Family history of throat cancer, GERD (gastroesophageal reflux disease), Hypertension, Joint pain, Migraine without aura, Multiple food allergies, and Palpitations.  She is being evaluated today for an acute issue of left-sided neck pain that has been present for 2 to 3 weeks.  She reports that she has been working out and started weight training exercises the weekend before her symptoms started.  When her symptoms presented she did a MD live televisit through her work and was advised to use [place] and over-the-counter pain medication.  Her symptoms got better, but then she started noticing "grinding noise when she would turn her head.  Yesterday she felt numbness radiate down her left arm.    She continues to have some pain in her left neck but her range of motion has improved  Hot showers help   Review of Systems See HPI   Past Medical History:  Diagnosis Date   Anemia    Arthritis    Knees   Back pain    Chest pain    Constipation    Dysmenorrhea    Family history of breast cancer    Family history of prostate cancer    Family history of throat cancer    GERD (gastroesophageal reflux disease)    Hypertension    Joint pain    Migraine without aura    Multiple food allergies    Milk   Palpitations     Social History   Socioeconomic History   Marital status: Significant Other    Spouse name: Henderson Cloud   Number of children: 1   Years of education: Not on file   Highest education level: Not on file  Occupational History   Occupation: Nurse, children's   Tobacco Use   Smoking status: Never   Smokeless tobacco: Never  Vaping Use   Vaping Use: Never used   Substance and Sexual Activity   Alcohol use: No   Drug use: No   Sexual activity: Yes    Partners: Male    Birth control/protection: Pill  Other Topics Concern   Not on file  Social History Narrative   She works at [employer].    Not married    1 child ( boy)   Social Determinants of Corporate investment banker Strain: Not on file  Food Insecurity: Not on file  Transportation Needs: Not on file  Physical Activity: Not on file  Stress: Not on file  Social Connections: Not on file  Intimate Partner Violence: Not on file    Past Surgical History:  Procedure Laterality Date   CESAREAN SECTION  01/11/2005    Family History  Problem Relation Age of Onset   Sudden Cardiac Death Mother        MI   Breast cancer Paternal Aunt        dx >50, unconfirmed diagnosis   Hypertension Maternal Grandmother    Diabetes Maternal Grandmother    Hypertension Maternal Aunt    Prostate cancer Maternal Uncle        dx 60s   Throat cancer Paternal Aunt        d. >50, smoker   Breast cancer Paternal Great-grandmother  PGF's mother    Allergies  Allergen Reactions   Milk-Related Compounds Swelling    Current Outpatient Medications on File Prior to Visit  Medication Sig Dispense Refill   atenolol (TENORMIN) 25 MG tablet Take 25 mg by mouth daily.     buPROPion (WELLBUTRIN SR) 150 MG 12 hr tablet Take 1 tablet (150 mg total) by mouth daily. 30 tablet 0   levonorgestrel (MIRENA) 20 MCG/24HR IUD 1 each by Intrauterine route once.     levothyroxine (SYNTHROID) 50 MCG tablet Take 1 tablet (50 mcg total) by mouth daily. 45 tablet 3   naproxen sodium (ANAPROX DS) 550 MG tablet Take 1 tablet (550 mg total) by mouth 2 (two) times daily with a meal. 30 tablet 2   Vitamin D, Ergocalciferol, (DRISDOL) 1.25 MG (50000 UNIT) CAPS capsule Take 1 capsule (50,000 Units total) by mouth every 7 (seven) days. 12 capsule 3   No current facility-administered medications on file prior to visit.    BP  128/88   Pulse 77   Temp 98.1 F (36.7 C) (Oral)   Ht 5\' 6"  (1.676 m)   Wt 214 lb (97.1 kg)   SpO2 99%   BMI 34.54 kg/m       Objective:   Physical Exam Vitals and nursing note reviewed.  Constitutional:      Appearance: Normal appearance.  Cardiovascular:     Rate and Rhythm: Normal rate and regular rhythm.     Pulses: Normal pulses.     Heart sounds: Normal heart sounds.  Pulmonary:     Effort: Pulmonary effort is normal.     Breath sounds: Normal breath sounds.  Musculoskeletal:        General: Tenderness (Mild tenderness with palpation to the trapezius on the left side.) present. Normal range of motion.     Cervical back: Normal. No swelling, edema, deformity, spasms, tenderness or crepitus. Normal range of motion.  Skin:    General: Skin is warm and dry.     Capillary Refill: Capillary refill takes less than 2 seconds.  Neurological:     General: No focal deficit present.     Mental Status: She is alert and oriented to person, place, and time.  Psychiatric:        Mood and Affect: Mood normal.        Behavior: Behavior normal.        Thought Content: Thought content normal.        Judgment: Judgment normal.      Assessment & Plan:  1. Neck pain - Seems to be more muscular in nature. Advised warm compress while at rest.  - cyclobenzaprine (FLEXERIL) 10 MG tablet; Take 1 tablet (10 mg total) by mouth at bedtime.  Dispense: 15 tablet; Refill: 0 - methylPREDNISolone (MEDROL DOSEPAK) 4 MG TBPK tablet; Take as directed  Dispense: 21 tablet; Refill: 0 - Follow up if not resolved in the next 3-4 days   , NP  Time spent on chart review, time with patient; discussion of  muscle straines. home treatment, follow up plan, and documentation 30 minutes

## 2021-05-17 ENCOUNTER — Telehealth: Payer: Managed Care, Other (non HMO) | Admitting: Family Medicine

## 2021-05-17 ENCOUNTER — Encounter: Payer: Self-pay | Admitting: Family Medicine

## 2021-05-17 DIAGNOSIS — U071 COVID-19: Secondary | ICD-10-CM | POA: Diagnosis not present

## 2021-05-17 MED ORDER — MOLNUPIRAVIR EUA 200MG CAPSULE: 4.0000 | ORAL_CAPSULE | Freq: Two times a day (BID) | ORAL | 0 refills | Status: AC

## 2021-05-17 NOTE — Progress Notes (Signed)
Virtual Visit via Video Note  I connected with Chinita Greenland on 05/17/21 at  4:30 PM EDT by a video enabled telemedicine application 2/2 COVID-19 pandemic and verified that I am speaking with the correct person using two identifiers.  Location patient: home Location provider:work or home office Persons participating in the virtual visit: patient, provider  I discussed the limitations of evaluation and management by telemedicine and the availability of in person appointments. The patient expressed understanding and agreed to proceed. Chief Complaint  Patient presents with   Covid Positive    Symptoms started yesterday, chest pain, sore throat, sweating but feels cold, coughing, vomit, fatigue. Symptoms got worse today, took some nyquil, and tylenol for headache     HPI: Pt started feeling sick yesterday.  Felt like a "truck hit" her today.  At home COVID test positive today 7/25.  Sxs include diaphoresis, CP, HA, fatigue, sore throat, chest congestion, cough, chills, posttussive emesis, and decreased appetite.  Sick contacts include her significant other who was sick last wk.  Denies loss of taste or smell, n/v, diarrhea.  Tried tylenol and nyquil.  Pt had 2 doses of Pfizer COVID vaccine.  ROS: See pertinent positives and negatives per HPI.  Past Medical History:  Diagnosis Date   Anemia    Arthritis    Knees   Back pain    Chest pain    Constipation    Dysmenorrhea    Family history of breast cancer    Family history of prostate cancer    Family history of throat cancer    GERD (gastroesophageal reflux disease)    Hypertension    Joint pain    Migraine without aura    Multiple food allergies    Milk   Palpitations     Past Surgical History:  Procedure Laterality Date   CESAREAN SECTION  01/11/2005    Family History  Problem Relation Age of Onset   Sudden Cardiac Death Mother        MI   Breast cancer Paternal Aunt        dx >50, unconfirmed diagnosis    Hypertension Maternal Grandmother    Diabetes Maternal Grandmother    Hypertension Maternal Aunt    Prostate cancer Maternal Uncle        dx 60s   Throat cancer Paternal Aunt        d. >50, smoker   Breast cancer Paternal Great-grandmother        PGF's mother     Current Outpatient Medications:    atenolol (TENORMIN) 25 MG tablet, Take 25 mg by mouth daily., Disp: , Rfl:    buPROPion (WELLBUTRIN SR) 150 MG 12 hr tablet, Take 1 tablet (150 mg total) by mouth daily., Disp: 30 tablet, Rfl: 0   cyclobenzaprine (FLEXERIL) 10 MG tablet, Take 1 tablet (10 mg total) by mouth at bedtime., Disp: 15 tablet, Rfl: 0   levonorgestrel (MIRENA) 20 MCG/24HR IUD, 1 each by Intrauterine route once., Disp: , Rfl:    levothyroxine (SYNTHROID) 50 MCG tablet, Take 1 tablet (50 mcg total) by mouth daily., Disp: 45 tablet, Rfl: 3   methylPREDNISolone (MEDROL DOSEPAK) 4 MG TBPK tablet, Take as directed, Disp: 21 tablet, Rfl: 0   naproxen sodium (ANAPROX DS) 550 MG tablet, Take 1 tablet (550 mg total) by mouth 2 (two) times daily with a meal., Disp: 30 tablet, Rfl: 2   Vitamin D, Ergocalciferol, (DRISDOL) 1.25 MG (50000 UNIT) CAPS capsule, Take 1 capsule (50,000 Units total)  by mouth every 7 (seven) days., Disp: 12 capsule, Rfl: 3  EXAM:  VITALS per patient if applicable:   RR between 12-20 bpm  GENERAL: alert, oriented, appears sick, but non toxic and in no acute distress  HEENT: atraumatic, conjunctiva clear, no obvious abnormalities on inspection of external nose and ears  NECK: normal movements of the head and neck  LUNGS: on inspection no signs of respiratory distress, breathing rate appears normal, no obvious gross SOB, gasping or wheezing  CV: no obvious cyanosis  MS: moves all visible extremities without noticeable abnormality  PSYCH/NEURO: pleasant and cooperative, no obvious depression or anxiety, speech and thought processing grossly intact  ASSESSMENT AND PLAN:  Discussed the following  assessment and plan:  COVID-19 virus infection  -tested positive 05/17/21 with symptoms starting 05/16/21 -Continue supportive care including OTC cough/cold medications rest, hydration, gargling with warm salt water or Chloraseptic spray -Discussed r/b/a of antiviral medications.  Patient wishes to start medication -Given strict precautions - Plan: molnupiravir EUA 200 mg CAPS  Follow-up with PCP as needed   I discussed the assessment and treatment plan with the patient. The patient was provided an opportunity to ask questions and all were answered. The patient agreed with the plan and demonstrated an understanding of the instructions.   The patient was advised to call back or seek an in-person evaluation if the symptoms worsen or if the condition fails to improve as anticipated.  Deeann Saint, MD

## 2021-05-27 ENCOUNTER — Encounter: Payer: Self-pay | Admitting: Adult Health

## 2021-06-15 ENCOUNTER — Other Ambulatory Visit: Payer: Self-pay | Admitting: *Deleted

## 2021-06-15 ENCOUNTER — Telehealth: Payer: Self-pay | Admitting: Internal Medicine

## 2021-06-15 MED ORDER — LEVOTHYROXINE SODIUM 50 MCG PO TABS: 50.0000 ug | ORAL_TABLET | Freq: Every day | ORAL | 0 refills | Status: DC

## 2021-06-15 NOTE — Telephone Encounter (Signed)
MEDICATION: levothyroxine (SYNTHROID) 50 MCG tablet  PHARMACY:   [facility] - [place], [place] - [address] Phone:  [phone]  Fax:  [phone]      HAS THE PATIENT CONTACTED THEIR PHARMACY?  Yes-no refills  IS THIS A 90 DAY SUPPLY : Not sure  IS PATIENT OUT OF MEDICATION: No  IF NOT; HOW MUCH IS LEFT: 5 tablets  LAST APPOINTMENT DATE: @12 /28/2021  NEXT APPOINTMENT DATE:@9 /26/2022  DO WE HAVE YOUR PERMISSION TO LEAVE A DETAILED MESSAGE?:Yes  OTHER COMMENTS:    **Let patient know to contact pharmacy at the end of the day to make sure medication is ready. **  ** Please notify patient to allow 48-72 hours to process**  **Encourage patient to contact the pharmacy for refills or they can request refills through [facility]**

## 2021-06-15 NOTE — Telephone Encounter (Signed)
Refilled/sent Rx , 45 tab, R-0 until appt for more refills.

## 2021-07-19 ENCOUNTER — Other Ambulatory Visit (INDEPENDENT_AMBULATORY_CARE_PROVIDER_SITE_OTHER): Payer: Managed Care, Other (non HMO)

## 2021-07-19 ENCOUNTER — Ambulatory Visit (INDEPENDENT_AMBULATORY_CARE_PROVIDER_SITE_OTHER): Payer: Managed Care, Other (non HMO) | Admitting: Internal Medicine

## 2021-07-19 ENCOUNTER — Other Ambulatory Visit: Payer: Self-pay

## 2021-07-19 ENCOUNTER — Encounter: Payer: Self-pay | Admitting: Internal Medicine

## 2021-07-19 VITALS — BP 128/80 | HR 58 | Ht 66.0 in | Wt 216.2 lb

## 2021-07-19 DIAGNOSIS — E890 Postprocedural hypothyroidism: Secondary | ICD-10-CM

## 2021-07-19 DIAGNOSIS — E041 Nontoxic single thyroid nodule: Secondary | ICD-10-CM

## 2021-07-19 LAB — T4, FREE: Free T4: 0.66 ng/dL (ref 0.60–1.60)

## 2021-07-19 LAB — TSH: TSH: 5.08 u[IU]/mL (ref 0.35–5.50)

## 2021-07-19 MED ORDER — LEVOTHYROXINE SODIUM 75 MCG PO TABS: 75.0000 ug | ORAL_TABLET | Freq: Every day | ORAL | 3 refills | Status: DC

## 2021-07-19 NOTE — Progress Notes (Signed)
Patient ID: Audrey Stafford, female   DOB: 1980-12-23, 40 y.o.   MRN: [id number]   This visit occurred during the SARS-CoV-2 public health emergency.  Safety protocols were in place, including screening questions prior to the visit, additional usage of staff PPE, and extensive cleaning of exam room while observing appropriate contact time as indicated for disinfecting solutions.   HPI  Audrey Stafford is a 40 y.o.-year-old female, initially referred by her PCP, Nafziger, Kandee Keen, NP, returning for follow-up for a toxic thyroid nodule, now with post ablative hypothyroidism.  Last visit 9 months ago.  Interim history: She had shoulder pain >> had Prednisone >> resolved. Lost 8 lbs since last OV - going to the gym.  No weight gain, fatigue, cold intolerance, constipation.  Reviewed her TFTs: Lab Results  Component Value Date   TSH 2.47 12/11/2020   TSH 7.53 (H) 10/20/2020   TSH 4.13 04/17/2020   TSH <0.01 (L) 01/08/2020   TSH 0.05 (L) 10/08/2019   TSH 0.878 11/15/2018   TSH 1.48 11/16/2017   TSH 2.02 07/04/2017   TSH 0.57 08/21/2015   TSH 1.15 08/18/2014   FREET4 0.76 12/11/2020   FREET4 0.62 10/20/2020   FREET4 0.61 04/17/2020   FREET4 0.94 01/08/2020   FREET4 0.81 10/08/2019   FREET4 0.96 11/15/2018   T3FREE 3.4 10/20/2020   T3FREE 3.1 04/17/2020   T3FREE 4.8 (H) 01/08/2020   T3FREE 4.2 10/08/2019  07/23/2009: TSH 1.11  Her antithyroid antibodies were not elevated: 11/25/2019: TRAb 1.05 (<2) Lab Results  Component Value Date   TSI <89 01/08/2020   Reviewed previous imaging test and biopsy results: Thyroid U/S (11/17/2017): 1.6 x 1.4 x 1.2 cm solid hypoechoic left mid lobe nodule      FNA (02/22/20219):  Left thyroid nodule, Fine Needle Aspiration II (smears):      Benign thyroid nodule (Bethesda category 2).      Specimen Adequacy:  Satisfactory for evaluation.  Thyroid U/S (10/14/2019): 3.7 x 2.6 x 2.2 cm solid, isoechoic nodule, significantly increased  from last ultrasound Nodule # 1: Prior biopsy: No Location: Left; Mid Maximum size: 3.7 cm; Other 2 dimensions: 2.6 x 2.2 cm, previously, 1.6 x 1.4 x 1.2 cm Composition: solid/almost completely solid (2) Echogenicity: isoechoic (1) Significant change in size (>/= 20% in two dimensions and minimal increase of 2 mm): Yes  **Given size (>/= 2.5 cm) and appearance, fine needle aspiration of this mildly suspicious nodule should be considered based on TI-RADS Criteria.      FNA (11/05/2019): Atypia of unknown significance (Bethesda category 3) Afirma: benign  Thyroid uptake and scan (02/13/2020):  There is moderate, slightly heterogeneous uptake within the dominant left thyroid nodule demonstrated by ultrasound. There is relative suppression of activity throughout the remainder of the thyroid gland.   4 hour I-123 uptake = 19.3% (normal 5-20%)   24 hour I-123 uptake = 40.8% (normal 10-30%)   IMPRESSION: Findings are consistent with a toxic dominant left thyroid nodule, likely an adenoma. Relative suppression of activity in the remainder of the thyroid gland.  RAI treatment (03/16/2020)  She had insomnia, heat intolerance, palpitations.  All of these resolved after the RAI treatment.  She developed hypothyroidism after her RAI treatment.  We started levothyroxine 09/2021.  Pt is on levothyroxine 50 mcg daily, taken: - in am - fasting - at least 30 min from b'fast - no calcium - no iron - no multivitamins - no PPIs - not on Biotin  Pt does not have a  FH of thyroid ds. + FH of RA in MGM. No FH of thyroid cancer. No h/o radiation tx to head or neck.  NNo recent steroids use, but use this in the past for allergies.   Pt denies: - feeling nodules in neck - hoarseness - dysphagia - choking - SOB with lying down She is not able to see her thyroid nodule in the mirror anymore.  She also has a history of HTN, HL. On vitamin D once a week.  ROS: + see HPI  I  reviewed pt's medications, allergies, PMH, social hx, family hx, and changes were documented in the history of present illness. Otherwise, unchanged from my initial visit note.  Past Medical History:  Diagnosis Date   Anemia    Arthritis    Knees   Back pain    Chest pain    Constipation    Dysmenorrhea    Family history of breast cancer    Family history of prostate cancer    Family history of throat cancer    GERD (gastroesophageal reflux disease)    Hypertension    Joint pain    Migraine without aura    Multiple food allergies    Milk   Palpitations    Past Surgical History:  Procedure Laterality Date   CESAREAN SECTION  01/11/2005   Social History   Socioeconomic History   Marital status: Significant Other    Spouse name: Henderson Cloud   Number of children: 1   Years of education: Not on file   Highest education level: Not on file  Occupational History   Occupation: Nurse, children's   Tobacco Use   Smoking status: Never   Smokeless tobacco: Never  Vaping Use   Vaping Use: Never used  Substance and Sexual Activity   Alcohol use: No   Drug use: No   Sexual activity: Yes    Partners: Male    Birth control/protection: Pill  Other Topics Concern   Not on file  Social History Narrative   She works at [employer].    Not married    1 child ( boy)   Social Determinants of Corporate investment banker Strain: Not on file  Food Insecurity: Not on file  Transportation Needs: Not on file  Physical Activity: Not on file  Stress: Not on file  Social Connections: Not on file  Intimate Partner Violence: Not on file   Current Outpatient Medications on File Prior to Visit  Medication Sig Dispense Refill   atenolol (TENORMIN) 25 MG tablet Take 25 mg by mouth daily.     buPROPion (WELLBUTRIN SR) 150 MG 12 hr tablet Take 1 tablet (150 mg total) by mouth daily. 30 tablet 0   cyclobenzaprine (FLEXERIL) 10 MG tablet Take 1 tablet (10 mg total) by mouth at bedtime.  15 tablet 0   levonorgestrel (MIRENA) 20 MCG/24HR IUD 1 each by Intrauterine route once.     levothyroxine (SYNTHROID) 50 MCG tablet Take 1 tablet (50 mcg total) by mouth daily. 45 tablet 0   methylPREDNISolone (MEDROL DOSEPAK) 4 MG TBPK tablet Take as directed 21 tablet 0   naproxen sodium (ANAPROX DS) 550 MG tablet Take 1 tablet (550 mg total) by mouth 2 (two) times daily with a meal. 30 tablet 2   Vitamin D, Ergocalciferol, (DRISDOL) 1.25 MG (50000 UNIT) CAPS capsule Take 1 capsule (50,000 Units total) by mouth every 7 (seven) days. 12 capsule 3   No current facility-administered medications on file prior  to visit.   Allergies  Allergen Reactions   Milk-Related Compounds Swelling   Family History  Problem Relation Age of Onset   Sudden Cardiac Death Mother        MI   Breast cancer Paternal Aunt        dx >50, unconfirmed diagnosis   Hypertension Maternal Grandmother    Diabetes Maternal Grandmother    Hypertension Maternal Aunt    Prostate cancer Maternal Uncle        dx 10s   Throat cancer Paternal Aunt        d. >50, smoker   Breast cancer Paternal Great-grandmother        PGF's mother    PE: BP 128/80 (BP Location: Left Arm, Patient Position: Sitting, Cuff Size: Normal)   Pulse (!) 58   Ht 5\' 6"  (1.676 m)   Wt 216 lb 3.2 oz (98.1 kg)   SpO2 98%   BMI 34.90 kg/m  Wt Readings from Last 3 Encounters:  07/19/21 216 lb 3.2 oz (98.1 kg)  05/11/21 214 lb (97.1 kg)  12/28/20 221 lb 12.8 oz (100.6 kg)   Constitutional: overweight, in NAD Eyes: PERRLA, EOMI, no exophthalmos ENT: moist mucous membranes, no thyromegaly and no thyroid nodule palpated, no cervical lymphadenopathy Cardiovascular: RRR, No MRG Respiratory: CTA B Gastrointestinal: abdomen soft, NT, ND, BS+ Musculoskeletal: no deformities, strength intact in all 4 Skin: moist, warm, no rashes Neurological: no tremor with outstretched hands, DTR normal in all 4  ASSESSMENT: 1.  Post ablation  hypothyroidism  2.  Toxic Left thyroid nodule  PLAN:  1. And 2. Patient with thyrotoxicosis (low TSH x2), with several thyrotoxic symptoms including heat intolerance, palpitations, insomnia, but without weight loss, tremors, anxiety, hyper defecation. -She had a large left thyroid nodule on ultrasound and this was also palpable on exam.  Her thyroid uptake and scan showed that this nodule was in fact a toxic adenoma.  We then proceeded with RAI treatment, which she had last year.  She felt much better after the treatment, with improved heat intolerance, palpitations, insomnia.  She also felt that the nodule sizes decreased after the RAI treatment. -At our visit in 03/2020, TFTs were normal but we did discuss that she may become hypothyroid and I advised her to return for labs.  She did not return for labs and at last visit, TSH was high, at 7.53, with normal free T4 and free T3 hormones.  At that time, I advised her to start levothyroxine 50 mcg daily.  -Subsequent thyroid labs reviewed with pt. >> normal: Lab Results  Component Value Date   TSH 2.47 12/11/2020  - she continues on LT4 50 mcg daily - pt feels good on this dose. - we discussed about taking the thyroid hormone every day, with water, >30 minutes before breakfast, separated by >4 hours from acid reflux medications, calcium, iron, multivitamins. Pt. is taking it correctly. - will check thyroid tests today: TSH and fT4 - If labs are abnormal, she will need to return for repeat TFTs in 1.5 months - OTW, I will see her back in 6 months  2.  Left thyroid nodule -Also see above -The nodule was biopsied with an inconclusive result, however, the Afirma molecular marker returned benign, carrying a low risk of thyroid cancer -No neck compression symptoms at today's visit -We discussed that we can follow her clinically for this  Needs refills.  Component     Latest Ref Rng & Units 07/19/2021  TSH  0.35 - 5.50 uIU/mL 5.08   T4,Free(Direct)     0.60 - 1.60 ng/dL 5.68  TSH is at the upper limit of normal.  I would suggest to increase the dose of levothyroxine to 75 mcg daily and repeat her test in 1.5 months.  Carlus Pavlov, MD PhD [facility] Endocrinology

## 2021-07-19 NOTE — Patient Instructions (Signed)
Please continue Levothyroxine 50 mcg daily.  Take the thyroid hormone every day, with water, at least 30 minutes before breakfast, separated by at least 4 hours from: - acid reflux medications - calcium - iron - multivitamins  Please return in 6 months.Marland Kitchen

## 2021-07-28 ENCOUNTER — Other Ambulatory Visit: Payer: Self-pay

## 2021-07-29 ENCOUNTER — Encounter: Payer: Self-pay | Admitting: Adult Health

## 2021-07-29 ENCOUNTER — Ambulatory Visit: Payer: Managed Care, Other (non HMO) | Admitting: Adult Health

## 2021-07-29 VITALS — BP 110/70 | HR 87 | Temp 97.1°F | Ht 66.0 in | Wt 218.0 lb

## 2021-07-29 DIAGNOSIS — R079 Chest pain, unspecified: Secondary | ICD-10-CM | POA: Diagnosis not present

## 2021-07-29 NOTE — Progress Notes (Signed)
Subjective:    Patient ID: Audrey Stafford, female    DOB: 03-19-1981, 40 y.o.   MRN: [id number]  HPI  40 year old female who is being seen today after being told that she had an abnormal heart rate.  She reports that she went to urgent care after she had COVID to get released back to work.  While at urgent care it sounds like they did an EKG and noticed abnormal heart rate, she was not sure what the heart rate was.  She then went and saw her endocrinologist who told her that her heart rate was "slow".  She does report intermittent, short-lived, left-sided chest pain from time to time.  She is concerned because her mom died at the age of 68 from suspected cardiac issue.  Recently seen by her endocrinologist, Synthroid was increased from 50 to 75 mcg daily.   Review of Systems  See HPI   Past Medical History:  Diagnosis Date   Anemia    Arthritis    Knees   Back pain    Chest pain    Constipation    Dysmenorrhea    Family history of breast cancer    Family history of prostate cancer    Family history of throat cancer    GERD (gastroesophageal reflux disease)    Hypertension    Joint pain    Migraine without aura    Multiple food allergies    Milk   Palpitations     Social History   Socioeconomic History   Marital status: Significant Other    Spouse name: Henderson Cloud   Number of children: 1   Years of education: Not on file   Highest education level: Not on file  Occupational History   Occupation: Nurse, children's   Tobacco Use   Smoking status: Never   Smokeless tobacco: Never  Vaping Use   Vaping Use: Never used  Substance and Sexual Activity   Alcohol use: No   Drug use: No   Sexual activity: Yes    Partners: Male    Birth control/protection: Pill  Other Topics Concern   Not on file  Social History Narrative   She works at [employer].    Not married    1 child ( boy)   Social Determinants of Corporate investment banker Strain:  Not on file  Food Insecurity: Not on file  Transportation Needs: Not on file  Physical Activity: Not on file  Stress: Not on file  Social Connections: Not on file  Intimate Partner Violence: Not on file    Past Surgical History:  Procedure Laterality Date   CESAREAN SECTION  01/11/2005    Family History  Problem Relation Age of Onset   Sudden Cardiac Death Mother        MI   Breast cancer Paternal Aunt        dx >50, unconfirmed diagnosis   Hypertension Maternal Grandmother    Diabetes Maternal Grandmother    Hypertension Maternal Aunt    Prostate cancer Maternal Uncle        dx 60s   Throat cancer Paternal Aunt        d. >50, smoker   Breast cancer Paternal Great-grandmother        PGF's mother    Allergies  Allergen Reactions   Milk-Related Compounds Swelling    Current Outpatient Medications on File Prior to Visit  Medication Sig Dispense Refill   buPROPion (WELLBUTRIN SR) 150  MG 12 hr tablet Take 1 tablet (150 mg total) by mouth daily. 30 tablet 0   cyclobenzaprine (FLEXERIL) 10 MG tablet Take 1 tablet (10 mg total) by mouth at bedtime. 15 tablet 0   levonorgestrel (MIRENA) 20 MCG/24HR IUD 1 each by Intrauterine route once.     levothyroxine (SYNTHROID) 75 MCG tablet Take 1 tablet (75 mcg total) by mouth daily. 45 tablet 3   naproxen sodium (ANAPROX DS) 550 MG tablet Take 1 tablet (550 mg total) by mouth 2 (two) times daily with a meal. 30 tablet 2   Vitamin D, Ergocalciferol, (DRISDOL) 1.25 MG (50000 UNIT) CAPS capsule Take 1 capsule (50,000 Units total) by mouth every 7 (seven) days. 12 capsule 3   No current facility-administered medications on file prior to visit.    BP 110/70   Pulse 87   Temp (!) 97.1 F (36.2 C) (Temporal)   Ht 5\' 6"  (1.676 m)   Wt 218 lb (98.9 kg)   SpO2 97%   BMI 35.19 kg/m    Past Medical History:  Diagnosis Date   Anemia    Arthritis    Knees   Back pain    Chest pain    Constipation    Dysmenorrhea    Family history  of breast cancer    Family history of prostate cancer    Family history of throat cancer    GERD (gastroesophageal reflux disease)    Hypertension    Joint pain    Migraine without aura    Multiple food allergies    Milk   Palpitations     Social History   Socioeconomic History   Marital status: Significant Other    Spouse name:   Number of children: 1   Years of education: Not on file   Highest education level: Not on file  Occupational History   Occupation: Henderson Cloud   Tobacco Use   Smoking status: Never   Smokeless tobacco: Never  Vaping Use   Vaping Use: Never used  Substance and Sexual Activity   Alcohol use: No   Drug use: No   Sexual activity: Yes    Partners: Male    Birth control/protection: Pill  Other Topics Concern   Not on file  Social History Narrative   She works at Nurse, children's.    Not married    1 child ( boy)   Social Determinants of [employer] Strain: Not on file  Food Insecurity: Not on file  Transportation Needs: Not on file  Physical Activity: Not on file  Stress: Not on file  Social Connections: Not on file  Intimate Partner Violence: Not on file    Past Surgical History:  Procedure Laterality Date   CESAREAN SECTION  01/11/2005    Family History  Problem Relation Age of Onset   Sudden Cardiac Death Mother        MI   Breast cancer Paternal Aunt        dx >50, unconfirmed diagnosis   Hypertension Maternal Grandmother    Diabetes Maternal Grandmother    Hypertension Maternal Aunt    Prostate cancer Maternal Uncle        dx 60s   Throat cancer Paternal Aunt        d. >50, smoker   Breast cancer Paternal Great-grandmother        PGF's mother    Allergies  Allergen Reactions   Milk-Related Compounds Swelling    Current  Outpatient Medications on File Prior to Visit  Medication Sig Dispense Refill   buPROPion (WELLBUTRIN SR) 150 MG 12 hr tablet Take 1 tablet (150 mg total) by  mouth daily. 30 tablet 0   cyclobenzaprine (FLEXERIL) 10 MG tablet Take 1 tablet (10 mg total) by mouth at bedtime. 15 tablet 0   levonorgestrel (MIRENA) 20 MCG/24HR IUD 1 each by Intrauterine route once.     levothyroxine (SYNTHROID) 75 MCG tablet Take 1 tablet (75 mcg total) by mouth daily. 45 tablet 3   naproxen sodium (ANAPROX DS) 550 MG tablet Take 1 tablet (550 mg total) by mouth 2 (two) times daily with a meal. 30 tablet 2   Vitamin D, Ergocalciferol, (DRISDOL) 1.25 MG (50000 UNIT) CAPS capsule Take 1 capsule (50,000 Units total) by mouth every 7 (seven) days. 12 capsule 3   No current facility-administered medications on file prior to visit.    BP 110/70   Pulse 87   Temp (!) 97.1 F (36.2 C) (Temporal)   Ht 5\' 6"  (1.676 m)   Wt 218 lb (98.9 kg)   SpO2 97%   BMI 35.19 kg/m       Objective:   Physical Exam Vitals and nursing note reviewed.  Constitutional:      Appearance: Normal appearance.  Cardiovascular:     Rate and Rhythm: Normal rate and regular rhythm. FrequentExtrasystoles are present.    Pulses: Normal pulses.     Heart sounds: Normal heart sounds.  Pulmonary:     Effort: Pulmonary effort is normal.     Breath sounds: Normal breath sounds.  Musculoskeletal:        General: Normal range of motion.  Skin:    General: Skin is warm and dry.  Neurological:     General: No focal deficit present.     Mental Status: She is alert and oriented to person, place, and time.  Psychiatric:        Mood and Affect: Mood normal.        Behavior: Behavior normal.        Thought Content: Thought content normal.        Judgment: Judgment normal.       Assessment & Plan:   1. Chest pain, unspecified type  - EKG 12-Lead- NSR with ventricular bigeminy. Rate 87 - Ambulatory referral to Cardiology  , NP

## 2021-09-03 ENCOUNTER — Other Ambulatory Visit (INDEPENDENT_AMBULATORY_CARE_PROVIDER_SITE_OTHER): Payer: Managed Care, Other (non HMO)

## 2021-09-03 ENCOUNTER — Other Ambulatory Visit: Payer: Self-pay

## 2021-09-03 DIAGNOSIS — E890 Postprocedural hypothyroidism: Secondary | ICD-10-CM | POA: Diagnosis not present

## 2021-09-03 NOTE — Addendum Note (Signed)
Addended by: Adline Mango I on: 09/03/2021 03:55 PM   Modules accepted: Orders

## 2021-09-04 LAB — T4, FREE: Free T4: 1.33 ng/dL (ref 0.82–1.77)

## 2021-09-04 LAB — TSH: TSH: 1.68 u[IU]/mL (ref 0.450–4.500)

## 2021-09-10 ENCOUNTER — Other Ambulatory Visit: Payer: Self-pay

## 2021-09-10 ENCOUNTER — Encounter (HOSPITAL_BASED_OUTPATIENT_CLINIC_OR_DEPARTMENT_OTHER): Payer: Self-pay | Admitting: Cardiology

## 2021-09-10 ENCOUNTER — Ambulatory Visit (INDEPENDENT_AMBULATORY_CARE_PROVIDER_SITE_OTHER): Payer: Managed Care, Other (non HMO)

## 2021-09-10 ENCOUNTER — Ambulatory Visit (HOSPITAL_BASED_OUTPATIENT_CLINIC_OR_DEPARTMENT_OTHER): Payer: Managed Care, Other (non HMO) | Admitting: Cardiology

## 2021-09-10 VITALS — BP 140/76 | HR 94 | Ht 66.0 in | Wt 223.2 lb

## 2021-09-10 DIAGNOSIS — Z01812 Encounter for preprocedural laboratory examination: Secondary | ICD-10-CM

## 2021-09-10 DIAGNOSIS — R072 Precordial pain: Secondary | ICD-10-CM

## 2021-09-10 DIAGNOSIS — I493 Ventricular premature depolarization: Secondary | ICD-10-CM

## 2021-09-10 DIAGNOSIS — Z7189 Other specified counseling: Secondary | ICD-10-CM | POA: Diagnosis not present

## 2021-09-10 DIAGNOSIS — Z8249 Family history of ischemic heart disease and other diseases of the circulatory system: Secondary | ICD-10-CM

## 2021-09-10 DIAGNOSIS — R079 Chest pain, unspecified: Secondary | ICD-10-CM | POA: Diagnosis not present

## 2021-09-10 MED ORDER — METOPROLOL TARTRATE 100 MG PO TABS: ORAL_TABLET | ORAL | 0 refills | Status: DC

## 2021-09-10 NOTE — Patient Instructions (Addendum)
Medication Instructions:  Your Physician recommend you continue on your current medication as directed.    *If you need a refill on your cardiac medications before your next appointment, please call your pharmacy*   Lab Work: Your provider has recommended lab work today (BMP). Please have this collected at [facility]. The lab is open 8:00 am - 4:30 pm. Please avoid 12:00p - 1:00p for lunch hour. You do not need an appointment. Please go to [address] [place], [place] [address]. This is in the Primary Care office on the 3rd floor, let them know you are there for blood work and they will direct you to the lab.   If you have labs (blood work) drawn today and your tests are completely normal, you will receive your results only by: [place] (if you have MyChart) OR A paper copy in the mail If you have any lab test that is abnormal or we need to change your treatment, we will call you to review the results.   Testing/Procedures: Your physician has requested that you have an echocardiogram. Echocardiography is a painless test that uses sound waves to create images of your heart. It provides your doctor with information about the size and shape of your heart and how well your heart's chambers and valves are working. This procedure takes approximately one hour. There are no restrictions for this procedure. [place] has recommended that you wear an 3  DAY ZIO-PATCH monitor. The Zio patch cardiac monitor continuously records heart rhythm data for up to 14 days, this is for patients being evaluated for multiple types heart rhythms. For the first 24 hours post application, please avoid getting the Zio monitor wet in the shower or by excessive sweating during exercise. After that, feel free to carry on with regular activities. Keep soaps and lotions away from the ZIO XT Patch.     Cardiac CT Angiography (CTA), is a  special type of CT scan that uses a computer to produce multi-dimensional views of major blood vessels throughout the body. In CT angiography, a contrast material is injected through an IV to help visualize the blood vessels [facility]   Follow-Up: At [facility], you and your health needs are our priority.  As part of our continuing mission to provide you with exceptional heart care, we have created designated Provider Care Teams.  These Care Teams include your primary Cardiologist (physician) and Advanced Practice Providers (APPs -  Physician Assistants and Nurse Practitioners) who all work together to provide you with the care you need, when you need it.  We recommend signing up for the patient portal called "MyChart".  Sign up information is provided on this After Visit Summary.  MyChart is used to connect with patients for Virtual Visits (Telemedicine).  Patients are able to view lab/test results, encounter notes, upcoming appointments, etc.  Non-urgent messages can be sent to your provider as well.   To learn more about what you can do with MyChart, go to [email or web address].    Your next appointment:   6 month(s)  The format for your next appointment:   In Person  Provider:   Buford Dresser, MD     Your cardiac CT will be scheduled at one of the below locations:   [facility] [address] [place], [place] [address] [phone]   If scheduled at [facility], please arrive at the [facility] main entrance (  entrance A) of [facility] 30 minutes prior to test start time. You can use the FREE valet parking offered at the main entrance (encouraged to control the heart rate for the test) Proceed to the [facility] Radiology Department (first floor) to check-in and test prep.  If scheduled at [facility], please arrive 15 mins early for check-in and test prep.  Please follow these instructions carefully  (unless otherwise directed):   On the Night Before the Test: Be sure to Drink plenty of water. Do not consume any caffeinated/decaffeinated beverages or chocolate 12 hours prior to your test. Do not take any antihistamines 12 hours prior to your test.   On the Day of the Test: Drink plenty of water until 1 hour prior to the test. Do not eat any food 4 hours prior to the test. You may take your regular medications prior to the test.  Take metoprolol (Lopressor) 100 mg two hours prior to test. FEMALES- please wear underwire-free bra if available, avoid dresses & tight clothing        After the Test: Drink plenty of water. After receiving IV contrast, you may experience a mild flushed feeling. This is normal. On occasion, you may experience a mild rash up to 24 hours after the test. This is not dangerous. If this occurs, you can take Benadryl 25 mg and increase your fluid intake. If you experience trouble breathing, this can be serious. If it is severe call 911 IMMEDIATELY. If it is mild, please call our office. If you take any of these medications: Glipizide/Metformin, Avandament, Glucavance, please do not take 48 hours after completing test unless otherwise instructed.  Please allow 2-4 weeks for scheduling of routine cardiac CTs. Some insurance companies require a pre-authorization which may delay scheduling of this test.   For non-scheduling related questions, please contact the cardiac imaging nurse navigator should you have any questions/concerns: Rockwell [place], Cardiac Imaging Nurse Navigator Larey Brick, Cardiac Imaging Nurse Navigator [facility] Direct Office Dial: [phone]   For scheduling needs, including cancellations and rescheduling, please call [place], [phone].

## 2021-09-10 NOTE — Progress Notes (Signed)
Cardiology Office Note:    Date:  09/10/2021   ID:  Audrey Stafford, DOB 1981/03/01, MRN [id number]  PCP:  Dorothyann Peng, NP  Cardiologist:  Buford Dresser, MD  Referring MD: Dorothyann Peng, NP   CC: new patient consultation for the evaluation of chest pain  History of Present Illness:    Audrey Stafford is a 40 y.o. female with a hx of hypertension, hyperthyroidism, GERD, anemia, and arthritis, who is seen as a new consult at the request of Dorothyann Peng, NP for the evaluation and management of chest pain.  Chest pain: -Initial onset: A while ago,  -Quality: Quick pain that makes her exclaim "Ugh" but then is over -Frequency: Most recent 2 days ago, Suddenly while watching TV. -Duration: Hurts for a little while (maybe a minute), but then subsides -Associated symptoms: Possibly able to feel chest pain when heart rate is low -Aggravating/alleviating factors: Unknown -Prior cardiac history: none -Prior workup: None -Prior treatment: none -Alcohol: 1 drink during social dinners, not often -Tobacco: None -Comorbidities: hypertension, thyroid disease -Exercise level: Not a lot. Tries to go to the gym most days for 30 mins. For her work she is sitting at home with a computer up to 8 hours. -Cardiac ROS: no shortness of breath, no PND, no orthopnea, no LE edema, no syncope -Family history: Mother died in her sleep of arterial blockage at 39 yo. Maternal grandfather died of heart attack. Uncle had a stroke. One brother with good health, he is 54 yo. -Diet: Fried and baked chicken, hamburgers, french fries, fruits, salads, fish and shrimp.  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 There it was first noted that her heart rate was abnormal.  She denies any lightheadedness, headaches, syncope, or exertional symptoms.  Past Medical History:  Diagnosis Date   Anemia    Arthritis    Knees   Back pain    Chest pain     Constipation    Dysmenorrhea    Family history of breast cancer    Family history of prostate cancer    Family history of throat cancer    GERD (gastroesophageal reflux disease)    Hypertension    Joint pain    Migraine without aura    Multiple food allergies    Milk   Palpitations     Past Surgical History:  Procedure Laterality Date   CESAREAN SECTION  01/11/2005    Current Medications: Current Outpatient Medications on File Prior to Visit  Medication Sig   buPROPion (WELLBUTRIN SR) 150 MG 12 hr tablet Take 1 tablet (150 mg total) by mouth daily.   cyclobenzaprine (FLEXERIL) 10 MG tablet Take 1 tablet (10 mg total) by mouth at bedtime.   levonorgestrel (MIRENA) 20 MCG/24HR IUD 1 each by Intrauterine route once.   levothyroxine (SYNTHROID) 75 MCG tablet Take 1 tablet (75 mcg total) by mouth daily.   naproxen sodium (ANAPROX DS) 550 MG tablet Take 1 tablet (550 mg total) by mouth 2 (two) times daily with a meal.   Vitamin D, Ergocalciferol, (DRISDOL) 1.25 MG (50000 UNIT) CAPS capsule Take 1 capsule (50,000 Units total) by mouth every 7 (seven) days.   No current facility-administered medications on file prior to visit.     Allergies:   Milk-related compounds   Social History   Tobacco Use   Smoking status: Never   Smokeless tobacco: Never  Vaping Use   Vaping Use: Never used  Substance Use Topics   Alcohol use: No   Drug use: No    Family History: family history includes Breast cancer in her paternal aunt and paternal great-grandmother; Diabetes in her maternal grandmother; Hypertension in her maternal aunt and maternal grandmother; Prostate cancer in her maternal uncle; Sudden Cardiac Death in her mother; Throat cancer in her paternal aunt.  ROS:   Please see the history of present illness.  Additional pertinent ROS: Constitutional: Negative for chills, fever, night sweats, unintentional weight loss  HENT: Negative for ear pain and hearing loss.   Eyes:  Negative for loss of vision and eye pain.  Respiratory: Negative for cough, sputum, wheezing.   Cardiovascular: See HPI. Gastrointestinal: Negative for abdominal pain, melena, and hematochezia.  Genitourinary: Negative for dysuria and hematuria.  Musculoskeletal: Negative for falls and myalgias.  Skin: Negative for itching and rash.  Neurological: Negative for focal weakness, focal sensory changes and loss of consciousness.  Endo/Heme/Allergies: Does not bruise/bleed easily.     EKGs/Labs/Other Studies Reviewed:    The following studies were reviewed today: No prior cardiovascular studies available.  EKG:  EKG is personally reviewed.   09/10/2021: sinus rhythm with frequent PVCs, 94 bpm  Recent Labs: 09/03/2021: TSH 1.680   Recent Lipid Panel    Component Value Date/Time   CHOL 193 12/26/2019 1413   TRIG 148 12/26/2019 1413   HDL 34 (L) 12/26/2019 1413   CHOLHDL 5.7 (H) 12/26/2019 1413   CHOLHDL 4 07/04/2017 0830   VLDL 13.8 07/04/2017 0830   LDLCALC 132 (H) 12/26/2019 1413    Physical Exam:    VS:  BP 140/76   Pulse 94   Ht 5\' 6"  (1.676 m)   Wt 223 lb 3.2 oz (101.2 kg)   SpO2 97%   BMI 36.03 kg/m     Wt Readings from Last 3 Encounters:  09/10/21 223 lb 3.2 oz (101.2 kg)  07/29/21 218 lb (98.9 kg)  07/19/21 216 lb 3.2 oz (98.1 kg)    GEN: Well nourished, well developed in no acute distress HEENT: Normal, moist mucous membranes NECK: No JVD CARDIAC: regular rhythm, PVC's every other beat, normal S1 and S2, no rubs or gallops. No murmur. VASCULAR: Radial and DP pulses 2+ bilaterally. No carotid bruits RESPIRATORY:  Clear to auscultation without rales, wheezing or rhonchi  ABDOMEN: Soft, non-tender, non-distended MUSCULOSKELETAL:  Ambulates independently SKIN: Warm and dry, no edema NEUROLOGIC:  Alert and oriented x 3. No focal neuro deficits noted. PSYCHIATRIC:  Normal affect    ASSESSMENT:    1. PVC (premature ventricular contraction)   2. Chest pain of  uncertain etiology   3. Family history of heart disease   4. Cardiac risk counseling   5. Counseling on health promotion and disease prevention   6. Family history of premature CAD    PLAN:    Chest pain Frequent PVCs Family history of heart disease -discussed treadmill stress, nuclear stress/lexiscan, and CT coronary angiography. Discussed pros and cons of each, including but not limited to false positive/false negative risk, radiation risk, and risk of IV contrast dye. Based on shared decision making, decision was made to pursue CT coronary angiography. -will give one time dose of metoprolol 2 hours prior to scheduled test -counseled on need to get BMET prior to test -counseled on use of sublingual nitroglycerin and its importance to a good test  -given frequent PVCs, will get echo and monitor as well to evaluate for structural cause and determine overall burden  Addended to  add: unable to have CT performed due to frequent ectopy. Echo is reassuring. Awaiting monitor results. May need to discuss alternative testing.  Cardiac risk counseling and prevention recommendations: -recommend heart healthy/Mediterranean diet, with whole grains, fruits, vegetable, fish, lean meats, nuts, and olive oil. Limit salt. -recommend moderate walking, 3-5 times/week for 30-50 minutes each session. Aim for at least 150 minutes.week. Goal should be pace of 3 miles/hours, or walking 1.5 miles in 30 minutes -recommend avoidance of tobacco products. Avoid excess alcohol. -ASCVD risk score: The ASCVD Risk score (Arnett DK, et al., 2019) failed to calculate for the following reasons:   The 2019 ASCVD risk score is only valid for ages 60 to 26    Plan for follow up: 6 months or sooner as needed.  Buford Dresser, MD, PhD, [facility]    Medication Adjustments/Labs and Tests Ordered: Current medicines are reviewed at length with the patient today.  Concerns regarding medicines are  outlined above.   Orders Placed This Encounter  Procedures   CT CORONARY MORPH W/CTA COR W/SCORE W/CA W/CM &/OR WO/CM   Basic metabolic panel   LONG TERM MONITOR (3-14 DAYS)   EKG 12-Lead   ECHOCARDIOGRAM COMPLETE    Meds ordered this encounter  Medications   metoprolol tartrate (LOPRESSOR) 100 MG tablet    Sig: TAKE 1 TABLET 2 HR PRIOR TO CARDIAC PROCEDURE    Dispense:  1 tablet    Refill:  0    Patient Instructions  Medication Instructions:  Your Physician recommend you continue on your current medication as directed.    *If you need a refill on your cardiac medications before your next appointment, please call your pharmacy*   Lab Work: Your provider has recommended lab work today (BMP). Please have this collected at [facility]. The lab is open 8:00 am - 4:30 pm. Please avoid 12:00p - 1:00p for lunch hour. You do not need an appointment. Please go to [address] [place], [place] [address]. This is in the Primary Care office on the 3rd floor, let them know you are there for blood work and they will direct you to the lab.   If you have labs (blood work) drawn today and your tests are completely normal, you will receive your results only by: [place] (if you have MyChart) OR A paper copy in the mail If you have any lab test that is abnormal or we need to change your treatment, we will call you to review the results.   Testing/Procedures: Your physician has requested that you have an echocardiogram. Echocardiography is a painless test that uses sound waves to create images of your heart. It provides your doctor with information about the size and shape of your heart and how well your heart's chambers and valves are working. This procedure takes approximately one hour. There are no restrictions for this procedure. [facility] has recommended that you wear an 3  DAY ZIO-PATCH monitor. The Zio  patch cardiac monitor continuously records heart rhythm data for up to 14 days, this is for patients being evaluated for multiple types heart rhythms. For the first 24 hours post application, please avoid getting the Zio monitor wet in the shower or by excessive sweating during exercise. After that, feel free to carry on with regular activities. Keep soaps and lotions away from the ZIO XT Patch.     Cardiac CT Angiography (CTA), is a special  type of CT scan that uses a computer to produce multi-dimensional views of major blood vessels throughout the body. In CT angiography, a contrast material is injected through an IV to help visualize the blood vessels [facility]   Follow-Up: At [facility], you and your health needs are our priority.  As part of our continuing mission to provide you with exceptional heart care, we have created designated Provider Care Teams.  These Care Teams include your primary Cardiologist (physician) and Advanced Practice Providers (APPs -  Physician Assistants and Nurse Practitioners) who all work together to provide you with the care you need, when you need it.  We recommend signing up for the patient portal called "MyChart".  Sign up information is provided on this After Visit Summary.  MyChart is used to connect with patients for Virtual Visits (Telemedicine).  Patients are able to view lab/test results, encounter notes, upcoming appointments, etc.  Non-urgent messages can be sent to your provider as well.   To learn more about what you can do with MyChart, go to [email or web address].    Your next appointment:   6 month(s)  The format for your next appointment:   In Person  Provider:   Buford Dresser, MD     Your cardiac CT will be scheduled at one of the below locations:   [facility] [address] [place], [place] [address] [phone]   If scheduled at [facility], please arrive at the [facility] main  entrance (entrance A) of [facility] 30 minutes prior to test start time. You can use the FREE valet parking offered at the main entrance (encouraged to control the heart rate for the test) Proceed to the [facility] Radiology Department (first floor) to check-in and test prep.  If scheduled at [facility], please arrive 15 mins early for check-in and test prep.  Please follow these instructions carefully (unless otherwise directed):   On the Night Before the Test: Be sure to Drink plenty of water. Do not consume any caffeinated/decaffeinated beverages or chocolate 12 hours prior to your test. Do not take any antihistamines 12 hours prior to your test.   On the Day of the Test: Drink plenty of water until 1 hour prior to the test. Do not eat any food 4 hours prior to the test. You may take your regular medications prior to the test.  Take metoprolol (Lopressor) 100 mg two hours prior to test. FEMALES- please wear underwire-free bra if available, avoid dresses & tight clothing        After the Test: Drink plenty of water. After receiving IV contrast, you may experience a mild flushed feeling. This is normal. On occasion, you may experience a mild rash up to 24 hours after the test. This is not dangerous. If this occurs, you can take Benadryl 25 mg and increase your fluid intake. If you experience trouble breathing, this can be serious. If it is severe call 911 IMMEDIATELY. If it is mild, please call our office. If you take any of these medications: Glipizide/Metformin, Avandament, Glucavance, please do not take 48 hours after completing test unless otherwise instructed.  Please allow 2-4 weeks for scheduling of routine cardiac CTs. Some insurance companies require a pre-authorization which may delay scheduling of this test.   For non-scheduling related questions, please contact the cardiac imaging nurse navigator should you have any  questions/concerns: Marchia Bond, Cardiac Imaging Nurse Navigator Gordy Clement, Cardiac Imaging Nurse Navigator [place]  [facility] Direct Office Dial: [phone]   For scheduling needs, including cancellations and rescheduling, please call [place], [phone].     I,Mathew Stumpf,acting as a Education administrator for [employer], MD.,have documented all relevant documentation on the behalf of Buford Dresser, MD,as directed by  Buford Dresser, MD while in the presence of Buford Dresser, MD.  I, Buford Dresser, MD, have reviewed all documentation for this visit. The documentation on 09/10/21 for the exam, diagnosis, procedures, and orders are all accurate and complete.   Signed, Buford Dresser, MD PhD 09/10/2021 4:04 PM    [place]

## 2021-09-11 LAB — BASIC METABOLIC PANEL
BUN/Creatinine Ratio: 13 (ref 9–23)
BUN: 10 mg/dL (ref 6–20)
CO2: 21 mmol/L (ref 20–29)
Calcium: 9.8 mg/dL (ref 8.7–10.2)
Chloride: 103 mmol/L (ref 96–106)
Creatinine, Ser: 0.8 mg/dL (ref 0.57–1.00)
Glucose: 98 mg/dL (ref 70–99)
Potassium: 3.9 mmol/L (ref 3.5–5.2)
Sodium: 142 mmol/L (ref 134–144)
eGFR: 97 mL/min/{1.73_m2} (ref >59)

## 2021-09-24 ENCOUNTER — Ambulatory Visit (INDEPENDENT_AMBULATORY_CARE_PROVIDER_SITE_OTHER): Payer: Managed Care, Other (non HMO)

## 2021-09-24 ENCOUNTER — Telehealth (HOSPITAL_COMMUNITY): Payer: Self-pay | Admitting: *Deleted

## 2021-09-24 ENCOUNTER — Other Ambulatory Visit: Payer: Self-pay

## 2021-09-24 DIAGNOSIS — R079 Chest pain, unspecified: Secondary | ICD-10-CM | POA: Diagnosis not present

## 2021-09-24 DIAGNOSIS — I493 Ventricular premature depolarization: Secondary | ICD-10-CM

## 2021-09-24 NOTE — Telephone Encounter (Signed)
Reaching out to patient to offer assistance regarding upcoming cardiac imaging study; pt verbalizes understanding of appt date/time, parking situation and where to check in, pre-test NPO status and medications ordered, and verified current allergies; name and call back number provided for further questions should they arise  Larey Brick RN Navigator Cardiac Imaging [facility] [phone] office [phone] cell  Patient to take 100mg  metoprolol tartrate who  hours prior to cardiac CT scan.  She is aware to arrive at 4pm for her 4:30pm scan.

## 2021-09-27 ENCOUNTER — Encounter (HOSPITAL_COMMUNITY): Payer: Self-pay

## 2021-09-27 ENCOUNTER — Other Ambulatory Visit: Payer: Self-pay

## 2021-09-27 ENCOUNTER — Ambulatory Visit (HOSPITAL_COMMUNITY)
Admission: RE | Admit: 2021-09-27 | Discharge: 2021-09-27 | Disposition: A | Discharge status: Home / Self Care | Payer: Managed Care, Other (non HMO) | Source: Ambulatory Visit | Attending: Cardiology | Admitting: Cardiology

## 2021-09-27 DIAGNOSIS — R079 Chest pain, unspecified: Secondary | ICD-10-CM | POA: Insufficient documentation

## 2021-09-27 DIAGNOSIS — R072 Precordial pain: Secondary | ICD-10-CM | POA: Diagnosis present

## 2021-09-27 LAB — ECHOCARDIOGRAM COMPLETE
AR max vel: 2.16 cm2
AV Area VTI: 2.63 cm2
AV Area mean vel: 2.52 cm2
AV Mean grad: 4 mmHg
AV Peak grad: 10.5 mmHg
Ao pk vel: 1.62 m/s
Area-P 1/2: 3.31 cm2
S' Lateral: 2.97 cm

## 2021-09-27 MED ORDER — METOPROLOL TARTRATE 5 MG/5ML IV SOLN
INTRAVENOUS | Status: AC
  Administered 2021-09-27: 10 mg via INTRAVENOUS
  Filled 2021-09-27: qty 20

## 2021-09-27 MED ORDER — METOPROLOL TARTRATE 5 MG/5ML IV SOLN
10.0000 mg | INTRAVENOUS | Status: AC | PRN
  Administered 2021-09-27: 10 mg via INTRAVENOUS

## 2021-09-27 MED ORDER — NITROGLYCERIN 0.4 MG SL SUBL: 0.8000 mg | SUBLINGUAL_TABLET | Freq: Once | SUBLINGUAL | Status: DC

## 2021-09-27 NOTE — Progress Notes (Signed)
Patient came in for CT heart scan. Patient's heart rate was slightly elevated with occasional PVCs. Patient was given lopressor IV and PVCs were more regular. Patient remain in trigeminy for remainder of visit. Talked with MD, gave another dose of lopressor to no avail. Communicated with MD and RN Navigators about situation. Scan canceled. Patient discharged.

## 2021-11-02 ENCOUNTER — Encounter: Payer: Self-pay | Admitting: Internal Medicine

## 2021-12-15 ENCOUNTER — Ambulatory Visit (HOSPITAL_BASED_OUTPATIENT_CLINIC_OR_DEPARTMENT_OTHER): Payer: Managed Care, Other (non HMO) | Admitting: Cardiology

## 2021-12-15 ENCOUNTER — Encounter (HOSPITAL_BASED_OUTPATIENT_CLINIC_OR_DEPARTMENT_OTHER): Payer: Self-pay | Admitting: Cardiology

## 2021-12-15 ENCOUNTER — Other Ambulatory Visit: Payer: Self-pay

## 2021-12-15 VITALS — BP 150/94 | HR 70 | Ht 66.0 in | Wt 219.6 lb

## 2021-12-15 DIAGNOSIS — R079 Chest pain, unspecified: Secondary | ICD-10-CM | POA: Diagnosis not present

## 2021-12-15 DIAGNOSIS — I10 Essential (primary) hypertension: Secondary | ICD-10-CM

## 2021-12-15 DIAGNOSIS — Z8249 Family history of ischemic heart disease and other diseases of the circulatory system: Secondary | ICD-10-CM

## 2021-12-15 DIAGNOSIS — I493 Ventricular premature depolarization: Secondary | ICD-10-CM | POA: Diagnosis not present

## 2021-12-15 DIAGNOSIS — Z01812 Encounter for preprocedural laboratory examination: Secondary | ICD-10-CM

## 2021-12-15 MED ORDER — METOPROLOL SUCCINATE ER 25 MG PO TB24: 25.0000 mg | ORAL_TABLET | Freq: Every day | ORAL | 3 refills | Status: DC

## 2021-12-15 NOTE — Patient Instructions (Signed)
Medication Instructions:  Your physician has recommended you make the following change in your medication:   Start: Metoprolol Succinate 25mg  daily   *If you need a refill on your cardiac medications before your next appointment, please call your pharmacy*   Lab Work: Your physician recommends that you return for lab work in CBC w/ Diff and BMET   If you have labs (blood work) drawn today and your tests are completely normal, you will receive your results only by: MyChart Message (if you have MyChart) OR A paper copy in the mail If you have any lab test that is abnormal or we need to change your treatment, we will call you to review the results.   Testing/Procedures: Your physician has requested that you have a stress test! Please arrive 45 minutes to your appointment. Please be fasting from midnight on!    Follow-Up: At [facility], you and your health needs are our priority.  As part of our continuing mission to provide you with exceptional heart care, we have created designated Provider Care Teams.  These Care Teams include your primary Cardiologist (physician) and Advanced Practice Providers (APPs -  Physician Assistants and Nurse Practitioners) who all work together to provide you with the care you need, when you need it.  We recommend signing up for the patient portal called "MyChart".  Sign up information is provided on this After Visit Summary.  MyChart is used to connect with patients for Virtual Visits (Telemedicine).  Patients are able to view lab/test results, encounter notes, upcoming appointments, etc.  Non-urgent messages can be sent to your provider as well.   To learn more about what you can do with MyChart, go to [facility].    Your next appointment:   6 week(s)  The format for your next appointment:   In Person  Provider:   [email or web address], MD{

## 2021-12-15 NOTE — Progress Notes (Incomplete)
Cardiology Office Note:    Date:  12/15/2021   ID:  Audrey Stafford, Runk [birth year], MRN [id number]  PCP:  Dorothyann Peng, NP  Cardiologist:  Buford Dresser, MD  Referring MD: Dorothyann Peng, NP   CC: Follow-up  History of Present Illness:    Audrey Stafford is a 41 y.o. female with a hx of hypertension, hyperthyroidism, GERD, anemia, and arthritis, who is seen for follow-up. I initially met her 09/10/2021 as a new consult at the request of Dorothyann Peng, NP for the evaluation and management of chest pain.  Chest pain: -Initial onset: A while ago,  -Quality: Quick pain that makes her exclaim "Ugh" but then is over -Frequency: Most recent 2 days ago, Suddenly while watching TV. 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 There it was first noted that her heart rate was abnormal. -Prior workup: None -Prior treatment: none -Alcohol: 1 drink during social dinners, not often -Tobacco: None -Comorbidities: hypertension, thyroid disease -Exercise level: Not a lot. Tries to go to the gym most days for 30 mins. For her work she is sitting at home with a computer up to 8 hours. -Family history: Mother died in her sleep of arterial blockage at 40 yo. Maternal grandfather died of heart attack. Uncle had a stroke. One brother with good health, he is 44 yo. -Diet: Fried and baked chicken, hamburgers, french fries, fruits, salads, fish and shrimp.  Today: On 09/27/2021 her Cardiac CT was canceled because she remained in trigeminy with limited to no improvement on lopressor IV.  Overall, she is feeling fine. We reviewed her recent echo and monitor results at length.  During the day she generally feels fine. By the  evening, she begins to feel "sluggish." However, she is still able to exercise at the gym regularly including the treadmill.  Recently her blood pressure was also elevated prior to dental work. Sometimes her readings are high or more controlled at other clinic visits. She believes she may have white coat syndrome at times, and she notes that hypertension runs in her family. Soon she plans to obtain an at home blood pressure/heart monitor.  She continues to have some chest discomfort "here and there."  She endorses frequent headaches. Sometimes she needs to take OTC pain medication. She [facility] with [facility] at [facility].  At times she will wake up in the middle of the night, and then have difficulty falling back asleep for the next hour.  She denies any shortness of breath, or peripheral edema. No lightheadedness, syncope, orthopnea, or PND.  Past Medical History:  Diagnosis Date   Anemia    Arthritis    Knees   Back pain    Chest pain    Constipation    Dysmenorrhea    Family history of breast cancer    Family history of prostate cancer    Family history of throat cancer    GERD (gastroesophageal reflux disease)    Hypertension    Joint pain    Migraine without aura    Multiple food allergies    Milk   Palpitations     Past Surgical History:  Procedure Laterality Date   CESAREAN SECTION  01/11/2005    Current Medications: Current Outpatient Medications on File Prior  to Visit  Medication Sig   levonorgestrel (MIRENA) 20 MCG/24HR IUD 1 each by Intrauterine route once.   levothyroxine (SYNTHROID) 75 MCG tablet Take 1 tablet (75 mcg total) by mouth daily.   naproxen sodium (ANAPROX DS) 550 MG tablet Take 1 tablet (550 mg total) by mouth 2 (two) times daily with a meal.   Vitamin D, Ergocalciferol, (DRISDOL) 1.25 MG (50000 UNIT) CAPS capsule Take 1 capsule (50,000 Units total) by mouth every 7 (seven) days.   buPROPion (WELLBUTRIN SR) 150 MG 12 hr tablet Take 1 tablet  (150 mg total) by mouth daily. (Patient not taking: Reported on 09/27/2021)   cyclobenzaprine (FLEXERIL) 10 MG tablet Take 1 tablet (10 mg total) by mouth at bedtime. (Patient not taking: Reported on 09/27/2021)   No current facility-administered medications on file prior to visit.     Allergies:   Milk-related compounds   Social History   Tobacco Use   Smoking status: Never   Smokeless tobacco: Never  Vaping Use   Vaping Use: Never used  Substance Use Topics   Alcohol use: No   Drug use: No    Family History: family history includes Breast cancer in her paternal aunt and paternal great-grandmother; Diabetes in her maternal grandmother; Hypertension in her maternal aunt and maternal grandmother; Prostate cancer in her maternal uncle; Sudden Cardiac Death in her mother; Throat cancer in her paternal aunt.  ROS:   Please see the history of present illness. (+) Chest discomfort (+) Headaches (+) Fatigue All other systems are reviewed and negative.    EKGs/Labs/Other Studies Reviewed:    The following studies were reviewed today:  Echo 09/24/2021: Sonographer Comments: Suboptimal subcostal window and patient is morbidly  obese. Image acquisition challenging due to patient body habitus.  IMPRESSIONS    1. Frequent PVCs during study.   2. Left ventricular ejection fraction, by estimation, is 60 to 65%. The  left ventricle has normal function. The left ventricle has no regional  wall motion abnormalities. Left ventricular diastolic parameters are  consistent with Grade I diastolic  dysfunction (impaired relaxation).   3. Right ventricular systolic function is normal. The right ventricular  size is normal.   4. Left atrial size was mildly dilated.   5. The mitral valve is normal in structure. Trivial mitral valve  regurgitation.   6. The aortic valve is tricuspid. Aortic valve regurgitation is not  visualized.   7. The inferior vena cava is normal in size with greater than  50%  respiratory variability, suggesting right atrial pressure of 3 mmHg.   Monitor 08/2021: Patch Wear Time:  3 days and 0 hours   Patient had a min HR of 74 bpm, max HR of 135 bpm, and avg HR of 93 bpm. Predominant underlying rhythm was Sinus Rhythm. No VT, SVT, atrial fibrillation, high degree block, or pauses noted. Isolated SVEs were rare (<1.0%)t. Isolated VEs were frequent (26.1%).  EKG:  EKG is personally reviewed.   12/15/2021: EKG was not ordered. 09/10/2021: sinus rhythm with frequent PVCs, 94 bpm  Recent Labs: 09/03/2021: TSH 1.680 09/10/2021: BUN 10; Creatinine, Ser 0.80; Potassium 3.9; Sodium 142   Recent Lipid Panel    Component Value Date/Time   CHOL 193 12/26/2019 1413   TRIG 148 12/26/2019 1413   HDL 34 (L) 12/26/2019 1413   CHOLHDL 5.7 (H) 12/26/2019 1413   CHOLHDL 4 07/04/2017 0830   VLDL 13.8 07/04/2017 0830   LDLCALC 132 (H) 12/26/2019 1413    Physical Exam:  VS:  BP (!) 150/94    Pulse 70    Ht _0  (1.676 m)    Wt 219 lb 9.6 oz (99.6 kg)    SpO2 97%    BMI 35.44 kg/m     Wt Readings from Last 3 Encounters:  12/15/21 219 lb 9.6 oz (99.6 kg)  09/10/21 223 lb 3.2 oz (101.2 kg)  07/29/21 218 lb (98.9 kg)    GEN: Well nourished, well developed in no acute distress HEENT: Normal, moist mucous membranes NECK: No JVD CARDIAC: regular rhythm, PVC's every third beat, normal S1 and S2, no rubs or gallops. No murmur. VASCULAR: Radial and DP pulses 2+ bilaterally. No carotid bruits RESPIRATORY:  Clear to auscultation without rales, wheezing or rhonchi  ABDOMEN: Soft, non-tender, non-distended MUSCULOSKELETAL:  Ambulates independently SKIN: Warm and dry, no edema NEUROLOGIC:  Alert and oriented x 3. No focal neuro deficits noted. PSYCHIATRIC:  Normal affect    ASSESSMENT:    No diagnosis found.  PLAN:    Chest pain Frequent PVCs Family history of heart disease -discussed treadmill stress, nuclear stress/lexiscan, and CT coronary angiography.  Discussed pros and cons of each, including but not limited to false positive/false negative risk, radiation risk, and risk of IV contrast dye. Based on shared decision making, decision was made to pursue CT coronary angiography. -will give one time dose of metoprolol 2 hours prior to scheduled test -counseled on need to get BMET prior to test -counseled on use of sublingual nitroglycerin and its importance to a good test  -given frequent PVCs, will get echo and monitor as well to evaluate for structural cause and determine overall burden  Addended to add: unable to have CT performed due to frequent ectopy. Echo is reassuring. Awaiting monitor results. May need to discuss alternative testing.  Cardiac risk counseling and prevention recommendations: -recommend heart healthy/Mediterranean diet, with whole grains, fruits, vegetable, fish, lean meats, nuts, and olive oil. Limit salt. -recommend moderate walking, 3-5 times/week for 30-50 minutes each session. Aim for at least 150 minutes.week. Goal should be pace of 3 miles/hours, or walking 1.5 miles in 30 minutes -recommend avoidance of tobacco products. Avoid excess alcohol. -ASCVD risk score: The ASCVD Risk score (Arnett DK, et al., 2019) failed to calculate for the following reasons:   The 2019 ASCVD risk score is only valid for ages 70 to 61    Plan for follow up: 6 weeks or sooner as needed.  Buford Dresser, MD, PhD, [facility]    Medication Adjustments/Labs and Tests Ordered: Current medicines are reviewed at length with the patient today.  Concerns regarding medicines are outlined above.   No orders of the defined types were placed in this encounter.  No orders of the defined types were placed in this encounter.  There are no Patient Instructions on file for this visit.   I,Mathew Stumpf,acting as a Education administrator for [employer], MD.,have documented all relevant documentation on the behalf of  Buford Dresser, MD,as directed by  Buford Dresser, MD while in the presence of Buford Dresser, MD.  I, Madelin Rear, have reviewed all documentation for this visit. The documentation on 09/10/21 for the exam, diagnosis, procedures, and orders are all accurate and complete.   Signed, Buford Dresser, MD PhD 12/15/2021 9:36 AM    [place]

## 2021-12-22 NOTE — Progress Notes (Signed)
41 y.o. G1P1001 Significant Other Black or African American Not Hispanic or Latino female here for annual exam. She has a mirena IUD, inserted in 6/19. Strings missing, in place on ultrasound. ?During sex a couple a weeks ago she had some lower abdominal pain.  ?She typically has a cycle monthly. In February she had 2 cycles, 2 weeks apart. Cycles are light, she changes her pad in 4 hours but not full. ?Period Duration (Days): 5 ?Period Pattern: (!) Irregular ?Menstrual Flow: Moderate ?Menstrual Control: Maxi pad ?Menstrual Control Change Freq (Hours): 4 ?Dysmenorrhea: (!) Mild ?Dysmenorrhea Symptoms: Cramping ?Same long term partner, other than one time she doesn't have pain with intercourse.  ? ?She has a strong FH of breast cancer on her Fathers side. She was seen by Genetics,  not c/w hereditary. TC risk between 8.6-13% depending if PA had breast cancer.  ? ?H/O thyroid nodule, on synthroid. She is followed by Endocrinology.  ? ?She started seeing a Cardiologist for chest pain, PVC's. She is scheduled for a stress test.  ?Mom died at 16-42 of heart disease.  ? ?Patient's last menstrual period was 12/18/2021.     ?Sexually active: Yes.    ?The current method of family planning is IUD.    ?Exercising: Yes.     Cardio  ?Smoker:  no ? ?Health Maintenance: ?Pap:  11/02/17 Neg HR HPV Neg, 03/08/13 Neg  ?History of abnormal Pap:  no ?MMG:  none  ?BMD:   none  ?Colonoscopy: none  ?TDaP:  11/01/17  ?Gardasil: complete  ? ? reports that she has never smoked. She has never used smokeless tobacco. She reports that she does not drink alcohol and does not use drugs. Rare ETOH. Son is 43. She works for [employer] ? ?Past Medical History:  ?Diagnosis Date  ? Anemia   ? Arthritis   ? Knees  ? Back pain   ? Chest pain   ? Constipation   ? Dysmenorrhea   ? Family history of breast cancer   ? Family history of prostate cancer   ? Family history of throat cancer   ? GERD (gastroesophageal reflux disease)   ? Hypertension   ? Joint pain   ?  Migraine without aura   ? Multiple food allergies   ? Milk  ? Palpitations   ? ? ?Past Surgical History:  ?Procedure Laterality Date  ? CESAREAN SECTION  01/11/2005  ? ? ?Current Outpatient Medications  ?Medication Sig Dispense Refill  ? levonorgestrel (MIRENA) 20 MCG/24HR IUD 1 each by Intrauterine route once.    ? levothyroxine (SYNTHROID) 75 MCG tablet Take 1 tablet (75 mcg total) by mouth daily. 45 tablet 3  ? naproxen sodium (ANAPROX DS) 550 MG tablet Take 1 tablet (550 mg total) by mouth 2 (two) times daily with a meal. 30 tablet 2  ? Vitamin D, Ergocalciferol, (DRISDOL) 1.25 MG (50000 UNIT) CAPS capsule Take 1 capsule (50,000 Units total) by mouth every 7 (seven) days. 12 capsule 3  ? ?No current facility-administered medications for this visit.  ? ? ?Family History  ?Problem Relation Age of Onset  ? Sudden Cardiac Death Mother   ?     MI  ? Breast cancer Paternal Aunt   ?     dx >50, unconfirmed diagnosis  ? Hypertension Maternal Grandmother   ? Diabetes Maternal Grandmother   ? Hypertension Maternal Aunt   ? Prostate cancer Maternal Uncle   ?     dx 100s  ? Throat cancer Paternal  Aunt   ?     d. >50, smoker  ? Breast cancer Paternal Great-grandmother   ?     PGF's mother  ? ? ?Review of Systems  ?All other systems reviewed and are negative. ? ?Exam:   ?BP 136/74   Pulse 89   Ht 5\' 6"  (1.676 m)   Wt 221 lb (100.2 kg)   LMP 12/18/2021   SpO2 99%   BMI 35.67 kg/m?   Weight change: @WEIGHTCHANGE @ Height:   Height: 5\' 6"  (167.6 cm)  ?Ht Readings from Last 3 Encounters:  ?12/29/21 5\' 6"  (1.676 m)  ?12/15/21 5\' 6"  (1.676 m)  ?09/10/21 5\' 6"  (1.676 m)  ? ? ?General appearance: alert, cooperative and appears stated age ?Head: Normocephalic, without obvious abnormality, atraumatic ?Neck: no adenopathy, supple, symmetrical, trachea midline and thyroid normal to inspection and palpation ?Lungs: clear to auscultation bilaterally ?Cardiovascular: regular rate and rhythm ?Breasts: normal appearance, no masses or  tenderness ?Abdomen: soft, non-tender; non distended,  no masses,  no organomegaly ?Extremities: extremities normal, atraumatic, no cyanosis or edema ?Skin: Skin color, texture, turgor normal. No rashes or lesions ?Lymph nodes: Cervical, supraclavicular, and axillary nodes normal. ?No abnormal inguinal nodes palpated ?Neurologic: Grossly normal ? ? ?Pelvic: External genitalia:  no lesions ?             Urethra:  normal appearing urethra with no masses, tenderness or lesions ?             Bartholins and Skenes: normal    ?             Vagina: normal appearing vagina with normal color and discharge, no lesions ?             Cervix: no lesions and IUD string missing ?              ?Bimanual Exam:  Uterus:  normal size, contour, position, consistency, mobility, non-tender and anteverted ?             Adnexa: no mass, fullness, tenderness ?              Rectovaginal: Confirms ?              Anus:  normal sphincter tone, no lesions ? ?Gae Dry chaperoned for the exam. ? ?1. Well woman exam ?Discussed breast self exam ?Discussed calcium and vit D intake ?Mammogram after she turns 41  ?Some labs with Endocrinology ? ?2. Screening for cervical cancer ?- Cytology - PAP ? ?3. Vitamin D deficiency ?Just ran out of her vit d script ?- VITAMIN D 25 Hydroxy (Vit-D Deficiency, Fractures) ? ?4. Elevated LDL cholesterol level ?- Lipid panel ? ?5. Dysmenorrhea ?- naproxen sodium (ANAPROX DS) 550 MG tablet; Take 1 tablet (550 mg total) by mouth 2 (two) times daily with a meal.  Dispense: 30 tablet; Refill: 2 ? ?6. IUD check up ?Strings missing, in place on ultrasound ? ?7. BMI 35.0-35.9,adult ?- Hemoglobin A1c ? ?

## 2021-12-23 ENCOUNTER — Encounter (HOSPITAL_BASED_OUTPATIENT_CLINIC_OR_DEPARTMENT_OTHER): Payer: Self-pay

## 2021-12-24 NOTE — Telephone Encounter (Signed)
-  Returned call to pt regarding mychart message ?-Pt report c/o chest pressure that started Wednesday while at work. ?-Pt state later that evening she went to the gym and symptoms lessened but reoccurred while she attempted to sleep.  ?-Pt state symptoms lasted all day yesterday and on one occurrence radiated down her left arm. ?-She denies current chest pain but state she has not taken the metoprolol, and believes symptoms are related to the new medication.  ? ?Will forward to MD for recommendations.  ?

## 2021-12-29 ENCOUNTER — Other Ambulatory Visit (HOSPITAL_COMMUNITY)
Admission: RE | Admit: 2021-12-29 | Discharge: 2021-12-29 | Disposition: A | Discharge status: Home / Self Care | Payer: Managed Care, Other (non HMO) | Source: Ambulatory Visit | Attending: Obstetrics and Gynecology | Admitting: Obstetrics and Gynecology

## 2021-12-29 ENCOUNTER — Other Ambulatory Visit: Payer: Self-pay

## 2021-12-29 ENCOUNTER — Ambulatory Visit (INDEPENDENT_AMBULATORY_CARE_PROVIDER_SITE_OTHER): Payer: Managed Care, Other (non HMO) | Admitting: Obstetrics and Gynecology

## 2021-12-29 ENCOUNTER — Encounter: Payer: Self-pay | Admitting: Obstetrics and Gynecology

## 2021-12-29 VITALS — BP 136/74 | HR 89 | Ht 66.0 in | Wt 221.0 lb

## 2021-12-29 DIAGNOSIS — Z01419 Encounter for gynecological examination (general) (routine) without abnormal findings: Secondary | ICD-10-CM

## 2021-12-29 DIAGNOSIS — E7800 Pure hypercholesterolemia, unspecified: Secondary | ICD-10-CM | POA: Diagnosis not present

## 2021-12-29 DIAGNOSIS — Z6835 Body mass index (BMI) 35.0-35.9, adult: Secondary | ICD-10-CM

## 2021-12-29 DIAGNOSIS — N946 Dysmenorrhea, unspecified: Secondary | ICD-10-CM

## 2021-12-29 DIAGNOSIS — Z124 Encounter for screening for malignant neoplasm of cervix: Secondary | ICD-10-CM | POA: Diagnosis not present

## 2021-12-29 DIAGNOSIS — Z30431 Encounter for routine checking of intrauterine contraceptive device: Secondary | ICD-10-CM

## 2021-12-29 DIAGNOSIS — E559 Vitamin D deficiency, unspecified: Secondary | ICD-10-CM

## 2021-12-29 MED ORDER — NAPROXEN SODIUM 550 MG PO TABS: 550.0000 mg | ORAL_TABLET | Freq: Two times a day (BID) | ORAL | 2 refills | Status: DC

## 2021-12-29 NOTE — Patient Instructions (Signed)

## 2021-12-30 LAB — LIPID PANEL
Chol/HDL Ratio: 5.2 ratio — ABNORMAL HIGH (ref 0.0–4.4)
Cholesterol, Total: 193 mg/dL (ref 100–199)
HDL: 37 mg/dL — ABNORMAL LOW (ref >39)
LDL Chol Calc (NIH): 142 mg/dL — ABNORMAL HIGH (ref 0–99)
Triglycerides: 77 mg/dL (ref 0–149)
VLDL Cholesterol Cal: 14 mg/dL (ref 5–40)

## 2021-12-30 LAB — VITAMIN D 25 HYDROXY (VIT D DEFICIENCY, FRACTURES): Vit D, 25-Hydroxy: 31 ng/mL (ref 30.0–100.0)

## 2021-12-30 LAB — HEMOGLOBIN A1C
Est. average glucose Bld gHb Est-mCnc: 108 mg/dL
Hgb A1c MFr Bld: 5.4 % (ref 4.8–5.6)

## 2021-12-31 ENCOUNTER — Encounter: Payer: Self-pay | Admitting: Adult Health

## 2021-12-31 LAB — CYTOLOGY - PAP
Comment: NEGATIVE
Diagnosis: NEGATIVE
High risk HPV: NEGATIVE

## 2021-12-31 NOTE — Telephone Encounter (Signed)
Pt has been scheduled for CPE.  

## 2022-01-05 ENCOUNTER — Encounter: Payer: Self-pay | Admitting: Adult Health

## 2022-01-05 ENCOUNTER — Ambulatory Visit (INDEPENDENT_AMBULATORY_CARE_PROVIDER_SITE_OTHER): Payer: Managed Care, Other (non HMO) | Admitting: Adult Health

## 2022-01-05 VITALS — BP 132/88 | HR 98 | Temp 98.9°F | Ht 66.0 in | Wt 222.0 lb

## 2022-01-05 DIAGNOSIS — E669 Obesity, unspecified: Secondary | ICD-10-CM

## 2022-01-05 DIAGNOSIS — Z8249 Family history of ischemic heart disease and other diseases of the circulatory system: Secondary | ICD-10-CM

## 2022-01-05 DIAGNOSIS — Z6830 Body mass index (BMI) 30.0-30.9, adult: Secondary | ICD-10-CM

## 2022-01-05 DIAGNOSIS — E039 Hypothyroidism, unspecified: Secondary | ICD-10-CM

## 2022-01-05 DIAGNOSIS — I493 Ventricular premature depolarization: Secondary | ICD-10-CM

## 2022-01-05 DIAGNOSIS — Z0000 Encounter for general adult medical examination without abnormal findings: Secondary | ICD-10-CM

## 2022-01-05 DIAGNOSIS — Z1159 Encounter for screening for other viral diseases: Secondary | ICD-10-CM

## 2022-01-05 DIAGNOSIS — E782 Mixed hyperlipidemia: Secondary | ICD-10-CM

## 2022-01-05 LAB — COMPREHENSIVE METABOLIC PANEL
ALT: 14 U/L (ref 0–35)
AST: 17 U/L (ref 0–37)
Albumin: 4.6 g/dL (ref 3.5–5.2)
Alkaline Phosphatase: 88 U/L (ref 39–117)
BUN: 9 mg/dL (ref 6–23)
CO2: 25 mEq/L (ref 19–32)
Calcium: 9.6 mg/dL (ref 8.4–10.5)
Chloride: 104 mEq/L (ref 96–112)
Creatinine, Ser: 0.73 mg/dL (ref 0.40–1.20)
GFR: 103.74 mL/min (ref >60.00)
Glucose, Bld: 88 mg/dL (ref 70–99)
Potassium: 3.5 mEq/L (ref 3.5–5.1)
Sodium: 136 mEq/L (ref 135–145)
Total Bilirubin: 0.4 mg/dL (ref 0.2–1.2)
Total Protein: 7.4 g/dL (ref 6.0–8.3)

## 2022-01-05 LAB — CBC WITH DIFFERENTIAL/PLATELET
Basophils Absolute: 0 10*3/uL (ref 0.0–0.1)
Basophils Relative: 0.6 % (ref 0.0–3.0)
Eosinophils Absolute: 0.1 10*3/uL (ref 0.0–0.7)
Eosinophils Relative: 1.5 % (ref 0.0–5.0)
HCT: 39.4 % (ref 36.0–46.0)
Hemoglobin: 13.3 g/dL (ref 12.0–15.0)
Lymphocytes Relative: 30.5 % (ref 12.0–46.0)
Lymphs Abs: 1.8 10*3/uL (ref 0.7–4.0)
MCHC: 33.8 g/dL (ref 30.0–36.0)
MCV: 89.2 fl (ref 78.0–100.0)
Monocytes Absolute: 0.5 10*3/uL (ref 0.1–1.0)
Monocytes Relative: 9.1 % (ref 3.0–12.0)
Neutro Abs: 3.5 10*3/uL (ref 1.4–7.7)
Neutrophils Relative %: 58.3 % (ref 43.0–77.0)
Platelets: 265 10*3/uL (ref 150.0–400.0)
RBC: 4.42 Mil/uL (ref 3.87–5.11)
RDW: 13.5 % (ref 11.5–15.5)
WBC: 6 10*3/uL (ref 4.0–10.5)

## 2022-01-05 MED ORDER — SIMVASTATIN 10 MG PO TABS: 10.0000 mg | ORAL_TABLET | Freq: Every day | ORAL | 3 refills | Status: DC

## 2022-01-05 NOTE — Telephone Encounter (Signed)
-  Called pt to follow up  ?-Pt state since last conversation on 3/3, she stop taking metoprolol and symptoms has resolved.  ?-Pt questioning if provider feels she should resume medication    ?

## 2022-01-05 NOTE — Telephone Encounter (Signed)
Pt updated and verbalized understanding.  

## 2022-01-05 NOTE — Progress Notes (Signed)
? ?Subjective:  ? ? Patient ID: Audrey Stafford, female    DOB: 12-Jul-1981, 41 y.o.   MRN: [id number] ? ?HPI ?Patient presents for yearly preventative medicine examination. She is a pleasant 41 year old female who  has a past medical history of Anemia, Arthritis, Back pain, Chest pain, Constipation, Dysmenorrhea, Family history of breast cancer, Family history of prostate cancer, Family history of throat cancer, GERD (gastroesophageal reflux disease), Hypertension, Joint pain, Migraine without aura, Multiple food allergies, and Palpitations. ? ?History of thyroid nodule -is on Synthroid.  Is followed by endocrinology ?Lab Results  ?Component Value Date  ? TSH 1.680 09/03/2021  ? ?PVCs-seen by cardiology.  Her mother died at 69 heart disease.  He is scheduled for a stress test. Had started Metoprolol but felt as this caused chest pressure and she stopped it.  ? ?Hyperlipidemia -labs done at GYN recently.   ?Lab Results  ?Component Value Date  ? CHOL 193 12/29/2021  ? HDL 37 (L) 12/29/2021  ? LDLCALC 142 (H) 12/29/2021  ? TRIG 77 12/29/2021  ? CHOLHDL 5.2 (H) 12/29/2021  ? ?Obesity - continue to work on diet and goes to the gym frequently but is not able to lose weight  ?Wt Readings from Last 3 Encounters:  ?01/05/22 222 lb (100.7 kg)  ?12/29/21 221 lb (100.2 kg)  ?12/15/21 219 lb 9.6 oz (99.6 kg)  ? ? ?All immunizations and health maintenance protocols were reviewed with the patient and needed orders were placed. ? ?Appropriate screening laboratory values were ordered for the patient including screening of hyperlipidemia, renal function and hepatic function. ? ?Medication reconciliation,  past medical history, social history, problem list and allergies were reviewed in detail with the patient ? ?Goals were established with regard to weight loss, exercise, and  diet in compliance with medications.  ? ?Review of Systems  ?Constitutional: Negative.   ?HENT: Negative.    ?Eyes: Negative.   ?Respiratory: Negative.     ?Cardiovascular: Negative.   ?Gastrointestinal: Negative.   ?Endocrine: Negative.   ?Genitourinary: Negative.   ?Musculoskeletal: Negative.   ?Skin: Negative.   ?Allergic/Immunologic: Negative.   ?Neurological: Negative.   ?Hematological: Negative.   ?Psychiatric/Behavioral: Negative.    ? ?Past Medical History:  ?Diagnosis Date  ? Anemia   ? Arthritis   ? Knees  ? Back pain   ? Chest pain   ? Constipation   ? Dysmenorrhea   ? Family history of breast cancer   ? Family history of prostate cancer   ? Family history of throat cancer   ? GERD (gastroesophageal reflux disease)   ? Hypertension   ? Joint pain   ? Migraine without aura   ? Multiple food allergies   ? Milk  ? Palpitations   ? ? ?Social History  ? ?Socioeconomic History  ? Marital status: Significant Other  ?  Spouse name: Atilano Ina  ? Number of children: 1  ? Years of education: Not on file  ? Highest education level: Not on file  ?Occupational History  ? Occupation: Engineer, building services   ?Tobacco Use  ? Smoking status: Never  ? Smokeless tobacco: Never  ?Vaping Use  ? Vaping Use: Never used  ?Substance and Sexual Activity  ? Alcohol use: No  ? Drug use: No  ? Sexual activity: Yes  ?  Partners: Male  ?  Birth control/protection: Pill  ?Other Topics Concern  ? Not on file  ?Social History Narrative  ? She works at [employer].   ?  Not married   ? 1 child ( boy)  ? ?Social Determinants of Health  ? ?Financial Resource Strain: Not on file  ?Food Insecurity: Not on file  ?Transportation Needs: Not on file  ?Physical Activity: Not on file  ?Stress: Not on file  ?Social Connections: Not on file  ?Intimate Partner Violence: Not on file  ? ? ?Past Surgical History:  ?Procedure Laterality Date  ? CESAREAN SECTION  01/11/2005  ? ? ?Family History  ?Problem Relation Age of Onset  ? Sudden Cardiac Death Mother   ?     MI  ? Breast cancer Paternal Aunt   ?     dx >50, unconfirmed diagnosis  ? Hypertension Maternal Grandmother   ? Diabetes Maternal Grandmother    ? Hypertension Maternal Aunt   ? Prostate cancer Maternal Uncle   ?     dx 69s  ? Throat cancer Paternal Aunt   ?     d. >66, smoker  ? Breast cancer Paternal Great-grandmother   ?     PGF's mother  ? ? ?Allergies  ?Allergen Reactions  ? Milk-Related Compounds Swelling  ? ? ?Current Outpatient Medications on File Prior to Visit  ?Medication Sig Dispense Refill  ? levonorgestrel (MIRENA) 20 MCG/24HR IUD 1 each by Intrauterine route once.    ? levothyroxine (SYNTHROID) 75 MCG tablet Take 1 tablet (75 mcg total) by mouth daily. 45 tablet 3  ? naproxen sodium (ANAPROX DS) 550 MG tablet Take 1 tablet (550 mg total) by mouth 2 (two) times daily with a meal. 30 tablet 2  ? Vitamin D, Ergocalciferol, (DRISDOL) 1.25 MG (50000 UNIT) CAPS capsule Take 1 capsule (50,000 Units total) by mouth every 7 (seven) days. 12 capsule 3  ? ?No current facility-administered medications on file prior to visit.  ? ? ?BP 132/88   Pulse 98   Temp 98.9 ?F (37.2 ?C) (Oral)   Ht 5\' 6"  (1.676 m)   Wt 222 lb (100.7 kg)   LMP 12/18/2021   SpO2 98%   BMI 35.83 kg/m?  ? ? ?   ?Objective:  ? Physical Exam ?Vitals and nursing note reviewed.  ?Constitutional:   ?   General: She is not in acute distress. ?   Appearance: Normal appearance. She is well-developed. She is obese. She is not ill-appearing.  ?HENT:  ?   Head: Normocephalic and atraumatic.  ?   Right Ear: Tympanic membrane, ear canal and external ear normal. There is no impacted cerumen.  ?   Left Ear: Tympanic membrane, ear canal and external ear normal. There is no impacted cerumen.  ?   Nose: Nose normal. No congestion or rhinorrhea.  ?   Mouth/Throat:  ?   Mouth: Mucous membranes are moist.  ?   Pharynx: Oropharynx is clear. No oropharyngeal exudate or posterior oropharyngeal erythema.  ?Eyes:  ?   General:     ?   Right eye: No discharge.     ?   Left eye: No discharge.  ?   Extraocular Movements: Extraocular movements intact.  ?   Conjunctiva/sclera: Conjunctivae normal.  ?    Pupils: Pupils are equal, round, and reactive to light.  ?Neck:  ?   Thyroid: No thyromegaly.  ?   Vascular: No carotid bruit.  ?   Trachea: No tracheal deviation.  ?Cardiovascular:  ?   Rate and Rhythm: Normal rate and regular rhythm. FrequentExtrasystoles are present. ?   Pulses: Normal pulses.  ?   Heart sounds:  Normal heart sounds. No murmur heard. ?  No friction rub. No gallop.  ?Pulmonary:  ?   Effort: Pulmonary effort is normal. No respiratory distress.  ?   Breath sounds: Normal breath sounds. No stridor. No wheezing, rhonchi or rales.  ?Chest:  ?   Chest wall: No tenderness.  ?Abdominal:  ?   General: Abdomen is flat. Bowel sounds are normal. There is no distension.  ?   Palpations: Abdomen is soft. There is no mass.  ?   Tenderness: There is no abdominal tenderness. There is no right CVA tenderness, left CVA tenderness, guarding or rebound.  ?   Hernia: No hernia is present.  ?Musculoskeletal:     ?   General: No swelling, tenderness, deformity or signs of injury. Normal range of motion.  ?   Cervical back: Normal range of motion and neck supple.  ?   Right lower leg: No edema.  ?   Left lower leg: No edema.  ?Lymphadenopathy:  ?   Cervical: No cervical adenopathy.  ?Skin: ?   General: Skin is warm and dry.  ?   Coloration: Skin is not jaundiced or pale.  ?   Findings: No bruising, erythema, lesion or rash.  ?Neurological:  ?   General: No focal deficit present.  ?   Mental Status: She is alert and oriented to person, place, and time.  ?   Cranial Nerves: No cranial nerve deficit.  ?   Sensory: No sensory deficit.  ?   Motor: No weakness.  ?   Coordination: Coordination normal.  ?   Gait: Gait normal.  ?   Deep Tendon Reflexes: Reflexes normal.  ?Psychiatric:     ?   Mood and Affect: Mood normal.     ?   Behavior: Behavior normal.     ?   Thought Content: Thought content normal.     ?   Judgment: Judgment normal.  ? ?   ?Assessment & Plan:  ?1. Routine general medical examination at a health care  facility ? ?- CBC with Differential/Platelet; Future ?- Comprehensive metabolic panel; Future ?- CBC with Differential/Platelet ?- Comprehensive metabolic panel ? ?2. PVC (premature ventricular contraction) ?- Follow up w

## 2022-01-05 NOTE — Telephone Encounter (Signed)
Okay to remain off Metoprolol. Recommend she proceed with cardiac MRI as scheduled 01/20/22 and follow up as scheduled in April with Dr. Cristal Deer.  ? ?Alver Sorrow, NP  ?

## 2022-01-05 NOTE — Patient Instructions (Addendum)
Check with Dr. Elvera Lennox to see if it would be ok to start [facility].  ? ?We will follow up with you regarding your labs  ? ?Please follow up in 3-6 months for recheck of cholesterol levels  ? ? ? ? ? ? ?

## 2022-01-06 LAB — HEPATITIS C ANTIBODY
Hepatitis C Ab: NONREACTIVE
SIGNAL TO CUT-OFF: 0.03 (ref <1.00)

## 2022-01-15 LAB — BASIC METABOLIC PANEL
BUN/Creatinine Ratio: 12 (ref 9–23)
BUN: 9 mg/dL (ref 6–20)
CO2: 22 mmol/L (ref 20–29)
Calcium: 9.5 mg/dL (ref 8.7–10.2)
Chloride: 103 mmol/L (ref 96–106)
Creatinine, Ser: 0.76 mg/dL (ref 0.57–1.00)
Glucose: 110 mg/dL — ABNORMAL HIGH (ref 70–99)
Potassium: 4 mmol/L (ref 3.5–5.2)
Sodium: 141 mmol/L (ref 134–144)
eGFR: 102 mL/min/{1.73_m2} (ref >59)

## 2022-01-15 LAB — CBC WITH DIFFERENTIAL/PLATELET
Basophils Absolute: 0 10*3/uL (ref 0.0–0.2)
Basos: 1 %
EOS (ABSOLUTE): 0.1 10*3/uL (ref 0.0–0.4)
Eos: 1 %
Hematocrit: 40.5 % (ref 34.0–46.6)
Hemoglobin: 13.4 g/dL (ref 11.1–15.9)
Immature Grans (Abs): 0 10*3/uL (ref 0.0–0.1)
Immature Granulocytes: 0 %
Lymphocytes Absolute: 2.1 10*3/uL (ref 0.7–3.1)
Lymphs: 31 %
MCH: 29.8 pg (ref 26.6–33.0)
MCHC: 33.1 g/dL (ref 31.5–35.7)
MCV: 90 fL (ref 79–97)
Monocytes Absolute: 0.6 10*3/uL (ref 0.1–0.9)
Monocytes: 8 %
Neutrophils Absolute: 4.1 10*3/uL (ref 1.4–7.0)
Neutrophils: 59 %
Platelets: 295 10*3/uL (ref 150–450)
RBC: 4.5 x10E6/uL (ref 3.77–5.28)
RDW: 12.7 % (ref 11.7–15.4)
WBC: 6.9 10*3/uL (ref 3.4–10.8)

## 2022-01-17 ENCOUNTER — Other Ambulatory Visit: Payer: Self-pay | Admitting: Obstetrics and Gynecology

## 2022-01-17 ENCOUNTER — Encounter: Payer: Self-pay | Admitting: Obstetrics and Gynecology

## 2022-01-17 DIAGNOSIS — N926 Irregular menstruation, unspecified: Secondary | ICD-10-CM

## 2022-01-17 NOTE — Progress Notes (Signed)
See my chart message

## 2022-01-18 ENCOUNTER — Other Ambulatory Visit (HOSPITAL_COMMUNITY): Payer: Self-pay | Admitting: Emergency Medicine

## 2022-01-18 ENCOUNTER — Other Ambulatory Visit: Payer: Self-pay

## 2022-01-18 ENCOUNTER — Telehealth (HOSPITAL_COMMUNITY): Payer: Self-pay | Admitting: Emergency Medicine

## 2022-01-18 ENCOUNTER — Ambulatory Visit (INDEPENDENT_AMBULATORY_CARE_PROVIDER_SITE_OTHER): Payer: Managed Care, Other (non HMO) | Admitting: Internal Medicine

## 2022-01-18 ENCOUNTER — Encounter: Payer: Self-pay | Admitting: Internal Medicine

## 2022-01-18 VITALS — BP 122/82 | HR 85 | Ht 66.0 in | Wt 222.2 lb

## 2022-01-18 DIAGNOSIS — E041 Nontoxic single thyroid nodule: Secondary | ICD-10-CM

## 2022-01-18 DIAGNOSIS — E890 Postprocedural hypothyroidism: Secondary | ICD-10-CM

## 2022-01-18 DIAGNOSIS — Z8249 Family history of ischemic heart disease and other diseases of the circulatory system: Secondary | ICD-10-CM

## 2022-01-18 DIAGNOSIS — E0510 Thyrotoxicosis with toxic single thyroid nodule without thyrotoxic crisis or storm: Secondary | ICD-10-CM

## 2022-01-18 NOTE — Progress Notes (Signed)
Patient ID: Audrey Stafford, female   DOB: 10-21-1981, 41 y.o.   MRN: [id number]  ? ?This visit occurred during the SARS-CoV-2 public health emergency.  Safety protocols were in place, including screening questions prior to the visit, additional usage of staff PPE, and extensive cleaning of exam room while observing appropriate contact time as indicated for disinfecting solutions.  ? ?HPI  ?Audrey Stafford is a 41 y.o.-year-old female, initially referred by her PCP, Nafziger, Tommi Rumps, NP, returning for follow-up for a toxic thyroid nodule, now with post ablative hypothyroidism.  Last visit 6 months ago. ? ?Interim history: ?Before last visit, she lost 8 lbs since last OV - going to the gym. She gained 6 lbs since then. ?No weight gain, fatigue, cold intolerance, constipation. ?She would want my opinion about starting [facility]. ? ?Reviewed her TFTs: ?Lab Results  ?Component Value Date  ? TSH 1.680 09/03/2021  ? TSH 5.08 07/19/2021  ? TSH 2.47 12/11/2020  ? TSH 7.53 (H) 10/20/2020  ? TSH 4.13 04/17/2020  ? TSH <0.01 (L) 01/08/2020  ? TSH 0.05 (L) 10/08/2019  ? TSH 0.878 11/15/2018  ? TSH 1.48 11/16/2017  ? TSH 2.02 07/04/2017  ? FREET4 1.33 09/03/2021  ? FREET4 0.66 07/19/2021  ? FREET4 0.76 12/11/2020  ? FREET4 0.62 10/20/2020  ? FREET4 0.61 04/17/2020  ? FREET4 0.94 01/08/2020  ? FREET4 0.81 10/08/2019  ? FREET4 0.96 11/15/2018  ? T3FREE 3.4 10/20/2020  ? T3FREE 3.1 04/17/2020  ? T3FREE 4.8 (H) 01/08/2020  ? T3FREE 4.2 10/08/2019  ?07/23/2009: TSH 1.11 ? ?Her antithyroid antibodies were not elevated: ?Lab Results  ?Component Value Date  ? TSI <89 01/08/2020  ?11/25/2019: TRAb 1.05 (<2) ? ?Reviewed previous imaging test and biopsy results: ?Thyroid U/S (11/17/2017): 1.6 x 1.4 x 1.2 cm solid hypoechoic left mid lobe nodule ? ? ? ? ? ?FNA (02/22/20219):  Left thyroid nodule, Fine Needle Aspiration II (smears): ?     Benign thyroid nodule (Bethesda category 2). ?     Specimen Adequacy:  Satisfactory for  evaluation. ? ?Thyroid U/S (10/14/2019): 3.7 x 2.6 x 2.2 cm solid, isoechoic nodule, significantly increased from last ultrasound ?Nodule # 1: ?Prior biopsy: No ?Location: Left; Mid ?Maximum size: 3.7 cm; Other 2 dimensions: 2.6 x 2.2 cm, previously, 1.6 x 1.4 x 1.2 cm ?Composition: solid/almost completely solid (2) ?Echogenicity: isoechoic (1) ?Significant change in size (>/= 20% in two dimensions and minimal ?increase of 2 mm): Yes ? **Given size (>/= 2.5 cm) and appearance, fine needle aspiration of ?this mildly suspicious nodule should be considered based on TI-RADS ?Criteria. ? ? ? ? ? ?FNA (11/05/2019): Atypia of unknown significance (Bethesda category 3) ?Afirma: benign ? ?Thyroid uptake and scan (02/13/2020): ? ?There is moderate, slightly heterogeneous uptake within the dominant ?left thyroid nodule demonstrated by ultrasound. There is relative ?suppression of activity throughout the remainder of the thyroid ?gland. ?  ?4 hour I-123 uptake = 19.3% (normal 5-20%) ?  ?24 hour I-123 uptake = 40.8% (normal 10-30%) ?  ?IMPRESSION: ?Findings are consistent with a toxic dominant left thyroid nodule, ?likely an adenoma. Relative suppression of activity in the remainder ?of the thyroid gland. ? ?RAI treatment (03/16/2020) ? ?She initially had insomnia, heat intolerance, palpitations.  All of these resolved after the RAI treatment. ? ?She developed hypothyroidism after her RAI treatment.  We started levothyroxine 09/2021. ? ?Pt is on levothyroxine 75 mcg daily (increased at last visit), taken: ?- in am ?- fasting ?- at least 30 min from  b'fast ?- no calcium ?- no iron ?- no multivitamins ?- no PPIs ?- not on Biotin ? ?Pt does not have a FH of thyroid ds. + FH of RA in MGM. No FH of thyroid cancer. No h/o radiation tx to head or neck. ?No recent steroids use, but use this in the past for allergies.  ? ?Pt denies: ?- feeling nodules in neck ?- hoarseness ?- dysphagia ?- choking ?- SOB with lying down ?She is not able to  see her thyroid nodule in the mirror anymore, after RAI treatment. ? ?She also has a history of HTN, HL. ?On vitamin D supplements. ? ?ROS: ?+ see HPI ? ?I reviewed pt's medications, allergies, PMH, social hx, family hx, and changes were documented in the history of present illness. Otherwise, unchanged from my initial visit note. ? ?Past Medical History:  ?Diagnosis Date  ? Anemia   ? Arthritis   ? Knees  ? Back pain   ? Chest pain   ? Constipation   ? Dysmenorrhea   ? Family history of breast cancer   ? Family history of prostate cancer   ? Family history of throat cancer   ? GERD (gastroesophageal reflux disease)   ? Hypertension   ? Joint pain   ? Migraine without aura   ? Multiple food allergies   ? Milk  ? Palpitations   ? ?Past Surgical History:  ?Procedure Laterality Date  ? CESAREAN SECTION  01/11/2005  ? ?Social History  ? ?Socioeconomic History  ? Marital status: Significant Other  ?  Spouse name: Atilano Ina  ? Number of children: 1  ? Years of education: Not on file  ? Highest education level: Not on file  ?Occupational History  ? Occupation: Engineer, building services   ?Tobacco Use  ? Smoking status: Never  ? Smokeless tobacco: Never  ?Vaping Use  ? Vaping Use: Never used  ?Substance and Sexual Activity  ? Alcohol use: No  ? Drug use: No  ? Sexual activity: Yes  ?  Partners: Male  ?  Birth control/protection: Pill  ?Other Topics Concern  ? Not on file  ?Social History Narrative  ? She works at [employer].   ? Not married   ? 1 child ( boy)  ? ?Social Determinants of Health  ? ?Financial Resource Strain: Not on file  ?Food Insecurity: Not on file  ?Transportation Needs: Not on file  ?Physical Activity: Not on file  ?Stress: Not on file  ?Social Connections: Not on file  ?Intimate Partner Violence: Not on file  ? ?Current Outpatient Medications on File Prior to Visit  ?Medication Sig Dispense Refill  ? levonorgestrel (MIRENA) 20 MCG/24HR IUD 1 each by Intrauterine route once.    ? levothyroxine  (SYNTHROID) 75 MCG tablet Take 1 tablet (75 mcg total) by mouth daily. 45 tablet 3  ? naproxen sodium (ANAPROX DS) 550 MG tablet Take 1 tablet (550 mg total) by mouth 2 (two) times daily with a meal. 30 tablet 2  ? simvastatin (ZOCOR) 10 MG tablet Take 1 tablet (10 mg total) by mouth at bedtime. 90 tablet 3  ? Vitamin D, Ergocalciferol, (DRISDOL) 1.25 MG (50000 UNIT) CAPS capsule Take 1 capsule (50,000 Units total) by mouth every 7 (seven) days. 12 capsule 3  ? ?No current facility-administered medications on file prior to visit.  ? ?Allergies  ?Allergen Reactions  ? Milk-Related Compounds Swelling  ? ?Family History  ?Problem Relation Age of Onset  ? Sudden Cardiac Death Mother   ?  MI  ? Breast cancer Paternal Aunt   ?     dx >50, unconfirmed diagnosis  ? Hypertension Maternal Grandmother   ? Diabetes Maternal Grandmother   ? Hypertension Maternal Aunt   ? Prostate cancer Maternal Uncle   ?     dx 22s  ? Throat cancer Paternal Aunt   ?     d. >23, smoker  ? Breast cancer Paternal Great-grandmother   ?     PGF's mother  ? ?PE: ?BP 122/82 (BP Location: Right Arm, Patient Position: Sitting, Cuff Size: Normal)   Pulse 85   Ht 5\' 6"  (1.676 m)   Wt 222 lb 3.2 oz (100.8 kg)   SpO2 97%   BMI 35.86 kg/m?  ?Wt Readings from Last 3 Encounters:  ?01/18/22 222 lb 3.2 oz (100.8 kg)  ?01/05/22 222 lb (100.7 kg)  ?12/29/21 221 lb (100.2 kg)  ? ?Constitutional: overweight, in NAD ?Eyes: PERRLA, EOMI, no exophthalmos ?ENT: moist mucous membranes, no thyromegaly and no thyroid nodule palpated, no cervical lymphadenopathy ?Cardiovascular: RRR, No MRG ?Respiratory: CTA B ?Musculoskeletal: no deformities, strength intact in all 4 ?Skin: moist, warm, no rashes ?Neurological: no tremor with outstretched hands, DTR normal in all 4 ? ?ASSESSMENT: ?1.  Post ablation hypothyroidism ? ?2.  Left thyroid nodule ? ?PLAN:  ?1. And 2. 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  She developed hypothyroidism after RAI treatment. ?-We had to start levothyroxine 50 mcg daily.  At last visit, TSH was elevated so we increased the levothyroxine dose to 75 mcg daily. ?- latest thyroid labs revi

## 2022-01-18 NOTE — Telephone Encounter (Signed)
Reaching out to patient to offer assistance regarding upcoming cardiac imaging study; pt verbalizes understanding of appt date/time, parking situation and where to check in, and verified current allergies; name and call back number provided for further questions should they arise ?Rockwell Alexandria RN Navigator Cardiac Imaging ?[facility] ?[phone] office ?[phone] cell ? ?Denies iv issues ?Denies claustro ?Denies implants (other than IUD) ?Aware to withhold caffeine, stimulants ?EKG @ 11:00am ?Arrival 10:45am ? ?

## 2022-01-18 NOTE — Patient Instructions (Signed)
Please continue Levothyroxine 75 mcg daily.  Take the thyroid hormone every day, with water, at least 30 minutes before breakfast, separated by at least 4 hours from: - acid reflux medications - calcium - iron - multivitamins  Please stop at the lab.  Please return in 1 year. 

## 2022-01-19 ENCOUNTER — Other Ambulatory Visit: Payer: Self-pay | Admitting: Adult Health

## 2022-01-19 ENCOUNTER — Encounter: Payer: Self-pay | Admitting: Adult Health

## 2022-01-19 DIAGNOSIS — E669 Obesity, unspecified: Secondary | ICD-10-CM

## 2022-01-19 LAB — TSH: TSH: 1.8 u[IU]/mL (ref 0.35–5.50)

## 2022-01-19 LAB — T4, FREE: Free T4: 0.78 ng/dL (ref 0.60–1.60)

## 2022-01-19 MED ORDER — LEVOTHYROXINE SODIUM 75 MCG PO TABS
75.0000 ug | ORAL_TABLET | Freq: Every day | ORAL | 3 refills | Status: DC
Start: 2022-01-19 — End: 2023-01-18

## 2022-01-19 MED ORDER — SEMAGLUTIDE-WEIGHT MANAGEMENT 0.25 MG/0.5ML ~~LOC~~ SOAJ: 0.2500 mg | SUBCUTANEOUS | 0 refills | Status: DC

## 2022-01-19 NOTE — Telephone Encounter (Signed)
FYI

## 2022-01-20 ENCOUNTER — Encounter: Payer: Self-pay | Admitting: Cardiology

## 2022-01-20 ENCOUNTER — Ambulatory Visit (HOSPITAL_COMMUNITY)
Admission: RE | Admit: 2022-01-20 | Discharge: 2022-01-20 | Disposition: A | Discharge status: Home / Self Care | Payer: Managed Care, Other (non HMO) | Source: Ambulatory Visit | Attending: Cardiology | Admitting: Cardiology

## 2022-01-20 ENCOUNTER — Ambulatory Visit (HOSPITAL_COMMUNITY)
Admission: RE | Admit: 2022-01-20 | Discharge: 2022-01-20 | Disposition: A | Discharge status: Home / Self Care | Payer: Managed Care, Other (non HMO) | Source: Ambulatory Visit

## 2022-01-20 DIAGNOSIS — R079 Chest pain, unspecified: Secondary | ICD-10-CM | POA: Diagnosis not present

## 2022-01-20 DIAGNOSIS — I493 Ventricular premature depolarization: Secondary | ICD-10-CM | POA: Insufficient documentation

## 2022-01-20 MED ORDER — GADOBUTROL 1 MMOL/ML IV SOLN
10.0000 mL | Freq: Once | INTRAVENOUS | Status: AC | PRN
  Administered 2022-01-20: 10 mL via INTRAVENOUS

## 2022-01-20 MED ORDER — AMINOPHYLLINE 25 MG/ML IV SOLN
INTRAVENOUS | Status: AC
  Filled 2022-01-20: qty 10

## 2022-01-20 MED ORDER — REGADENOSON 0.4 MG/5ML IV SOLN
0.4000 mg | Freq: Once | INTRAVENOUS | Status: AC
  Filled 2022-01-20: qty 5

## 2022-01-20 MED ORDER — REGADENOSON 0.4 MG/5ML IV SOLN
INTRAVENOUS | Status: AC
  Administered 2022-01-20: 0.4 mg via INTRAVENOUS
  Filled 2022-01-20: qty 5

## 2022-01-20 MED ORDER — METOPROLOL TARTRATE 5 MG/5ML IV SOLN
INTRAVENOUS | Status: DC
Start: 2022-01-20 — End: 2022-01-20
  Filled 2022-01-20: qty 5

## 2022-01-20 MED ORDER — ALBUTEROL SULFATE HFA 108 (90 BASE) MCG/ACT IN AERS
INHALATION_SPRAY | RESPIRATORY_TRACT | Status: AC
  Filled 2022-01-20: qty 6.7

## 2022-01-20 MED ORDER — NITROGLYCERIN 0.4 MG SL SUBL
SUBLINGUAL_TABLET | SUBLINGUAL | Status: AC
  Filled 2022-01-20: qty 1

## 2022-01-20 NOTE — Progress Notes (Signed)
Post Lexiscan administration.  Dr. Bjorn Pippin at bedside.  Patient denies any symptoms. ?

## 2022-01-20 NOTE — Progress Notes (Signed)
Patient presents for stress MRI.  BP 155/83, HR 85.  No caffeine intake in prior 12 hours. No wheezing on exam.  EKG today shows sinus rhythm with frequent PVCs ?  ?Shared Decision Making/Informed Consent ?The risks [chest pain, shortness of breath, cardiac arrhythmias, dizziness, blood pressure fluctuations, myocardial infarction, stroke/transient ischemic attack, nausea, vomiting, allergic reaction, and life-threatening complications (estimated to be 1 in 10,000)], benefits (risk stratification, diagnosing coronary artery disease, treatment guidance) and alternatives of a MRI stress test were discussed in detail with patient and they agree to proceed. ? ? ? ?

## 2022-01-23 ENCOUNTER — Encounter (HOSPITAL_BASED_OUTPATIENT_CLINIC_OR_DEPARTMENT_OTHER): Payer: Self-pay | Admitting: Cardiology

## 2022-01-26 ENCOUNTER — Ambulatory Visit (HOSPITAL_BASED_OUTPATIENT_CLINIC_OR_DEPARTMENT_OTHER): Payer: Managed Care, Other (non HMO) | Admitting: Cardiology

## 2022-01-26 ENCOUNTER — Encounter (HOSPITAL_BASED_OUTPATIENT_CLINIC_OR_DEPARTMENT_OTHER): Payer: Self-pay | Admitting: Cardiology

## 2022-01-26 VITALS — BP 138/70 | HR 78 | Ht 66.0 in | Wt 221.2 lb

## 2022-01-26 DIAGNOSIS — I493 Ventricular premature depolarization: Secondary | ICD-10-CM | POA: Diagnosis not present

## 2022-01-26 DIAGNOSIS — Z7189 Other specified counseling: Secondary | ICD-10-CM

## 2022-01-26 DIAGNOSIS — I10 Essential (primary) hypertension: Secondary | ICD-10-CM | POA: Diagnosis not present

## 2022-01-26 DIAGNOSIS — Z8249 Family history of ischemic heart disease and other diseases of the circulatory system: Secondary | ICD-10-CM

## 2022-01-26 DIAGNOSIS — Z712 Person consulting for explanation of examination or test findings: Secondary | ICD-10-CM

## 2022-01-26 DIAGNOSIS — E6609 Other obesity due to excess calories: Secondary | ICD-10-CM

## 2022-01-26 DIAGNOSIS — Z6835 Body mass index (BMI) 35.0-35.9, adult: Secondary | ICD-10-CM

## 2022-01-26 MED ORDER — VERAPAMIL HCL ER 120 MG PO TBCR: 120.0000 mg | EXTENDED_RELEASE_TABLET | Freq: Every day | ORAL | 3 refills | Status: DC

## 2022-01-26 NOTE — Patient Instructions (Signed)
Medication Instructions:  ?1) START: Verapamil 120 mg daily (Please take for 1 week prior to starting [facility])  ? ?Semaglutide starting dose plan: ?-Start with the 0.25 mg dose every week for 4 weeks, ?-Then use the 0.5 mg dose every week for 4 weeks, ?-Then continue with 1 mg dose every week. We will meet when you are on the 1 mg dose to discuss timing of uptitration. ? ?Some nausea is normal, as is decreased appetite. If you have severe nausea or stomach pain, please contact us and stop the medication.  ? ?Teaching done with demo pen today and samples given today. ? ?*If you need a refill on your cardiac medications before your next appointment, please call your pharmacy* ? ? ?Lab Work: ?None ordered today ? ? ?Testing/Procedures: ?None ordered today ? ? ?Follow-Up: ?At [facility], you and your health needs are our priority.  As part of our continuing mission to provide you with exceptional heart care, we have created designated Provider Care Teams.  These Care Teams include your primary Cardiologist (physician) and Advanced Practice Providers (APPs -  Physician Assistants and Nurse Practitioners) who all work together to provide you with the care you need, when you need it. ? ?We recommend signing up for the patient portal called "MyChart".  Sign up information is provided on this After Visit Summary.  MyChart is used to connect with patients for Virtual Visits (Telemedicine).  Patients are able to view lab/test results, encounter notes, upcoming appointments, etc.  Non-urgent messages can be sent to your provider as well.   ?To learn more about what you can do with MyChart, go to [email or web address].   ? ?Your next appointment:   ?3 month(s) ? ?The format for your next appointment:   ?In Person ? ?Provider:   ?Jodelle Red, MD{ ? ? ? ?

## 2022-01-26 NOTE — Progress Notes (Signed)
?Cardiology Office Note:   ? ?Date:  01/28/2022  ? ?ID:  Roosevelt, DOB 1981/02/06, MRN [id number] ? ?PCP:  Dorothyann Peng, NP  ?Cardiologist:  Buford Dresser, MD ? ?Referring MD: Dorothyann Peng, NP  ? ?CC: Follow-up ? ?History of Present Illness:   ? ?Audrey Stafford is a 41 y.o. female with a hx of hypertension, hyperthyroidism, GERD, anemia, and arthritis, who is seen for follow-up. I initially met her 09/10/2021 as a new consult at the request of Dorothyann Peng, NP for the evaluation and management of chest pain. ? ? ?Today,  ?Overall, she is feeling well. She has completed her stress test and she reports that her stress test was different and she felt claustrophobic.  ? ?Seldomly she will feel a chest pain. When the pain occurs it "hits really hard", and then it can go days without her feeling any pain. ? ?After taking metoprolol previously and experiencing chest pains, she thought she was having a MI. She is amenable to starting the Verapamil. ? ?Recently started on simvastatin due to her recent LDL of 142. ? ?She denies any shortness of breath, or peripheral edema. No lightheadedness, headaches, syncope, orthopnea, or PND.  ? ?Past Medical History:  ?Diagnosis Date  ? Anemia   ? Arthritis   ? Knees  ? Back pain   ? Chest pain   ? Constipation   ? Dysmenorrhea   ? Family history of breast cancer   ? Family history of prostate cancer   ? Family history of throat cancer   ? GERD (gastroesophageal reflux disease)   ? Hypertension   ? Joint pain   ? Migraine without aura   ? Multiple food allergies   ? Milk  ? Palpitations   ? ? ?Past Surgical History:  ?Procedure Laterality Date  ? CESAREAN SECTION  01/11/2005  ? ? ?Current Medications: ?Current Outpatient Medications on File Prior to Visit  ?Medication Sig  ? levonorgestrel (MIRENA) 20 MCG/24HR IUD 1 each by Intrauterine route once.  ? levothyroxine (SYNTHROID) 75 MCG tablet Take 1 tablet (75 mcg total) by mouth daily.  ? naproxen sodium  (ANAPROX DS) 550 MG tablet Take 1 tablet (550 mg total) by mouth 2 (two) times daily with a meal.  ? simvastatin (ZOCOR) 10 MG tablet Take 1 tablet (10 mg total) by mouth at bedtime.  ? Vitamin D, Ergocalciferol, (DRISDOL) 1.25 MG (50000 UNIT) CAPS capsule Take 1 capsule (50,000 Units total) by mouth every 7 (seven) days.  ? Semaglutide-Weight Management 0.25 MG/0.5ML SOAJ Inject 0.25 mg into the skin once a week. (Patient not taking: Reported on 01/26/2022)  ? ?No current facility-administered medications on file prior to visit.  ?  ? ?Allergies:   Milk-related compounds  ? ?Social History  ? ?Tobacco Use  ? Smoking status: Never  ? Smokeless tobacco: Never  ?Vaping Use  ? Vaping Use: Never used  ?Substance Use Topics  ? Alcohol use: No  ? Drug use: No  ? ? ?Family History: ?family history includes Breast cancer in her paternal aunt and paternal great-grandmother; Diabetes in her maternal grandmother; Hypertension in her maternal aunt and maternal grandmother; Prostate cancer in her maternal uncle; Sudden Cardiac Death in her mother; Throat cancer in her paternal aunt. ? ?Mother died in her sleep of arterial blockage at 40 yo. Maternal grandfather died of heart attack. Uncle had a stroke. One brother with good health, he is 57 yo. ? ?ROS:   ?Please see the history of present  illness. ?(+) Chest pain ?(+) Palpitations ?All other systems are reviewed and negative.  ? ? ?EKGs/Labs/Other Studies Reviewed:   ? ?The following studies were reviewed today: ? ?Cardiac Stress Test 01/20/2022: ?IMPRESSION: ?1.  No evidence of ischemia on stress perfusion imaging ?  ?2.  No late gadolinium enhancement to suggest myocardial scar ?  ?3.  Normal LV size, wall thickness, and systolic function (EF 92%) ?  ?4.  Normal RV size and systolic function (EF 11%) ? ?Echo 09/24/2021: ?Sonographer Comments: Suboptimal subcostal window and patient is morbidly  ?obese. Image acquisition challenging due to patient body habitus.  ?IMPRESSIONS  ? ?  1. Frequent PVCs during study.  ? 2. Left ventricular ejection fraction, by estimation, is 60 to 65%. The  ?left ventricle has normal function. The left ventricle has no regional  ?wall motion abnormalities. Left ventricular diastolic parameters are  ?consistent with Grade I diastolic  ?dysfunction (impaired relaxation).  ? 3. Right ventricular systolic function is normal. The right ventricular  ?size is normal.  ? 4. Left atrial size was mildly dilated.  ? 5. The mitral valve is normal in structure. Trivial mitral valve  ?regurgitation.  ? 6. The aortic valve is tricuspid. Aortic valve regurgitation is not  ?visualized.  ? 7. The inferior vena cava is normal in size with greater than 50%  ?respiratory variability, suggesting right atrial pressure of 3 mmHg.  ? ?Monitor 08/2021: ?Patch Wear Time:  3 days and 0 hours ?  ?Patient had a min HR of 74 bpm, max HR of 135 bpm, and avg HR of 93 bpm. Predominant underlying rhythm was Sinus Rhythm. No VT, SVT, atrial fibrillation, high degree block, or pauses noted. Isolated SVEs were rare (<1.0%)t. Isolated VEs were frequent (26.1%). ? ?EKG:  EKG is personally reviewed. ?01/26/2022: not ordered today   ?12/15/2021: EKG was not ordered. ?09/10/2021: sinus rhythm with frequent PVCs, 94 bpm ? ?Recent Labs: ?01/05/2022: ALT 14 ?01/14/2022: BUN 9; Creatinine, Ser 0.76; Hemoglobin 13.4; Platelets 295; Potassium 4.0; Sodium 141 ?01/18/2022: TSH 1.80  ? ?Recent Lipid Panel ?   ?Component Value Date/Time  ? CHOL 193 12/29/2021 1448  ? TRIG 77 12/29/2021 1448  ? HDL 37 (L) 12/29/2021 1448  ? CHOLHDL 5.2 (H) 12/29/2021 1448  ? CHOLHDL 4 07/04/2017 0830  ? VLDL 13.8 07/04/2017 0830  ? [place] 142 (H) 12/29/2021 1448  ? ? ?Physical Exam:   ? ?VS:  BP 138/70   Pulse 78   Ht _0  (1.676 m)   Wt 221 lb 3.2 oz (100.3 kg)   SpO2 99%   BMI 35.70 kg/m?    ? ?Wt Readings from Last 3 Encounters:  ?01/26/22 221 lb 3.2 oz (100.3 kg)  ?01/18/22 222 lb 3.2 oz (100.8 kg)  ?01/05/22 222 lb (100.7 kg)   ?  ?GEN: Well nourished, well developed in no acute distress ?HEENT: Normal, moist mucous membranes ?NECK: No JVD ?CARDIAC: regular rhythm, PVC's every 5th beat, normal S1 and S2, no rubs or gallops. No murmur. ?VASCULAR: Radial and DP pulses 2+ bilaterally. No carotid bruits ?RESPIRATORY:  Clear to auscultation without rales, wheezing or rhonchi  ?ABDOMEN: Soft, non-tender, non-distended ?MUSCULOSKELETAL:  Ambulates independently ?SKIN: Warm and dry, no edema ?NEUROLOGIC:  Alert and oriented x 3. No focal neuro deficits noted. ?PSYCHIATRIC:  Normal affect   ? ?ASSESSMENT:   ? ?1. PVC (premature ventricular contraction)   ?2. Family history of premature CAD   ?3. Primary hypertension   ?4. Cardiac risk counseling   ?  5. Counseling on health promotion and disease prevention   ?6. Encounter to discuss test results   ?7. Class 2 obesity due to excess calories without serious comorbidity with body mass index (BMI) of 35.0 to 35.9 in adult   ? ?PLAN:   ? ?Chest pain ?Frequent PVCs ?Family history of heart disease/premature CAD ?-unable to have CT due to frequent ectopy ?-echo reassuring ?-did not tolerate metoprolol. Willing to trial verapamil. If she cannot tolerate this, would refer to EP to discuss other options ?-discussed cMRI stress test results, reassuring ?-counseled on red flag signs that need immediate medical evaluation ? ?Hypertension ?-starting verapamil for PVCs, as above ?-discussed home monitoring ? ?Obesity: ?BMI 35 ?-she has attempted weight loss without success. With her family history of premature CAD, she would like to optimize her cardiovascular risk.  ?-we discussed GLP1RA today. Discussed how they work. She has been prescribed this but not yet started. Has not received samples or teaching yet ?-we did [facility] teaching todau, including how to give injection. Also gave samples (0.25 mg q weekly x 4 weeks). I instructed her not to start until she had been on verapamil for 1 week so we can  differentiate if there are side effects ? ?Cardiac risk counseling and prevention recommendations: family history of premature CAD (mother deceased aged 43) ?-recommend heart healthy/Mediterranean diet, with whole grain

## 2022-05-04 ENCOUNTER — Ambulatory Visit (HOSPITAL_BASED_OUTPATIENT_CLINIC_OR_DEPARTMENT_OTHER): Payer: Managed Care, Other (non HMO) | Admitting: Cardiology

## 2022-05-04 ENCOUNTER — Encounter (HOSPITAL_BASED_OUTPATIENT_CLINIC_OR_DEPARTMENT_OTHER): Payer: Self-pay | Admitting: Cardiology

## 2022-05-04 VITALS — BP 134/92 | HR 75 | Ht 66.0 in | Wt 222.7 lb

## 2022-05-04 DIAGNOSIS — Z8249 Family history of ischemic heart disease and other diseases of the circulatory system: Secondary | ICD-10-CM

## 2022-05-04 DIAGNOSIS — I493 Ventricular premature depolarization: Secondary | ICD-10-CM

## 2022-05-04 DIAGNOSIS — Z6835 Body mass index (BMI) 35.0-35.9, adult: Secondary | ICD-10-CM

## 2022-05-04 DIAGNOSIS — R079 Chest pain, unspecified: Secondary | ICD-10-CM

## 2022-05-04 DIAGNOSIS — I10 Essential (primary) hypertension: Secondary | ICD-10-CM | POA: Diagnosis not present

## 2022-05-04 DIAGNOSIS — E6609 Other obesity due to excess calories: Secondary | ICD-10-CM

## 2022-05-04 DIAGNOSIS — Z79899 Other long term (current) drug therapy: Secondary | ICD-10-CM

## 2022-05-04 NOTE — Patient Instructions (Signed)
Medication Instructions:  Your Physician recommend you continue on your current medication as directed.    *If you need a refill on your cardiac medications before your next appointment, please call your pharmacy*   Lab Work: Your provider has recommended lab work (fasting lipid, LFT). Please have this collected at [facility] at [place]. The lab is open 8:00 am - 4:30 pm. Please avoid 12:00p - 1:00p for lunch hour. You do not need an appointment. Please go to [address] [place], [place] [address]. This is in the Primary Care office on the 3rd floor, let them know you are there for blood work and they will direct you to the lab.  If you have labs (blood work) drawn today and your tests are completely normal, you will receive your results only by: MyChart Message (if you have MyChart) OR A paper copy in the mail If you have any lab test that is abnormal or we need to change your treatment, we will call you to review the results.   Testing/Procedures: None ordered today   Follow-Up: At [facility], you and your health needs are our priority.  As part of our continuing mission to provide you with exceptional heart care, we have created designated Provider Care Teams.  These Care Teams include your primary Cardiologist (physician) and Advanced Practice Providers (APPs -  Physician Assistants and Nurse Practitioners) who all work together to provide you with the care you need, when you need it.  We recommend signing up for the patient portal called "MyChart".  Sign up information is provided on this After Visit Summary.  MyChart is used to connect with patients for Virtual Visits (Telemedicine).  Patients are able to view lab/test results, encounter notes, upcoming appointments, etc.  Non-urgent messages can be sent to your provider as well.   To learn more about what you can do with MyChart, go to [email or web address].    Your next appointment:   1  year(s)  The format for your next appointment:   In Person  Provider:   Jodelle Red, MD{

## 2022-05-04 NOTE — Progress Notes (Signed)
Cardiology Office Note:    Date:  05/04/2022   ID:  Audrey Stafford, DOB [birth year], MRN [id number]  PCP:  Dorothyann Peng, NP  Cardiologist:  Buford Dresser, MD  Referring MD: Dorothyann Peng, NP   CC: Follow-up  History of Present Illness:    Audrey Stafford is a 41 y.o. female with a hx of hypertension, hyperthyroidism, GERD, anemia, and arthritis, who is seen for follow-up. I initially met her 09/10/2021 as a new consult at the request of Dorothyann Peng, NP for the evaluation and management of chest pain.  At her last appointment, she reported seldom chest pain that "hits really hard." She had been started on simvastatin due to her LDL of 142. She also noted experiencing chest pains after taking metoprolol. She was willing to trial Verapamil.  Today: Overall she states she is feeling good. She is tolerating verapamil with no side effects at this time.   Here and there she will notice palpitations (described as a "quick thomp") that last only a second; generally these are much improved.  She has been working on weight loss and increasing her exercise. She also stays active with walking along a trail at the park and playing basketball with her son.  Her last lab work for cholesterol was in March 2023.  She denies any chest pain, shortness of breath, or peripheral edema. No lightheadedness, headaches, syncope, orthopnea, or PND.   Past Medical History:  Diagnosis Date   Anemia    Arthritis    Knees   Back pain    Chest pain    Constipation    Dysmenorrhea    Family history of breast cancer    Family history of prostate cancer    Family history of throat cancer    GERD (gastroesophageal reflux disease)    Hypertension    Joint pain    Migraine without aura    Multiple food allergies    Milk   Palpitations     Past Surgical History:  Procedure Laterality Date   CESAREAN SECTION  01/11/2005    Current Medications: Current Outpatient  Medications on File Prior to Visit  Medication Sig   levonorgestrel (MIRENA) 20 MCG/24HR IUD 1 each by Intrauterine route once.   levothyroxine (SYNTHROID) 75 MCG tablet Take 1 tablet (75 mcg total) by mouth daily.   naproxen sodium (ANAPROX DS) 550 MG tablet Take 1 tablet (550 mg total) by mouth 2 (two) times daily with a meal.   simvastatin (ZOCOR) 10 MG tablet Take 1 tablet (10 mg total) by mouth at bedtime.   verapamil (CALAN-SR) 120 MG CR tablet Take 1 tablet (120 mg total) by mouth daily.   Vitamin D, Ergocalciferol, (DRISDOL) 1.25 MG (50000 UNIT) CAPS capsule Take 1 capsule (50,000 Units total) by mouth every 7 (seven) days.   Semaglutide-Weight Management 0.25 MG/0.5ML SOAJ Inject 0.25 mg into the skin once a week. (Patient not taking: Reported on 01/26/2022)   No current facility-administered medications on file prior to visit.     Allergies:   Milk-related compounds   Social History   Tobacco Use   Smoking status: Never   Smokeless tobacco: Never  Vaping Use   Vaping Use: Never used  Substance Use Topics   Alcohol use: No   Drug use: No    Family History: family history includes Breast cancer in her paternal aunt and paternal great-grandmother; Diabetes in her maternal grandmother; Hypertension in her maternal aunt and maternal grandmother; Prostate cancer in her maternal  uncle; Sudden Cardiac Death in her mother; Throat cancer in her paternal aunt.  Mother died in her sleep of arterial blockage at 40 yo. Maternal grandfather died of heart attack. Uncle had a stroke. One brother with good health, he is 30 yo.  ROS:   Please see the history of present illness. (+) Palpitations All other systems are reviewed and negative.    EKGs/Labs/Other Studies Reviewed:    The following studies were reviewed today:  Cardiac Stress Test 01/20/2022: IMPRESSION: 1.  No evidence of ischemia on stress perfusion imaging   2.  No late gadolinium enhancement to suggest myocardial  scar   3.  Normal LV size, wall thickness, and systolic function (EF 71%)   4.  Normal RV size and systolic function (EF 69%)  Echo 09/24/2021: Sonographer Comments: Suboptimal subcostal window and patient is morbidly  obese. Image acquisition challenging due to patient body habitus.  IMPRESSIONS    1. Frequent PVCs during study.   2. Left ventricular ejection fraction, by estimation, is 60 to 65%. The  left ventricle has normal function. The left ventricle has no regional  wall motion abnormalities. Left ventricular diastolic parameters are  consistent with Grade I diastolic  dysfunction (impaired relaxation).   3. Right ventricular systolic function is normal. The right ventricular  size is normal.   4. Left atrial size was mildly dilated.   5. The mitral valve is normal in structure. Trivial mitral valve  regurgitation.   6. The aortic valve is tricuspid. Aortic valve regurgitation is not  visualized.   7. The inferior vena cava is normal in size with greater than 50%  respiratory variability, suggesting right atrial pressure of 3 mmHg.   Monitor 08/2021: Patch Wear Time:  3 days and 0 hours   Patient had a min HR of 74 bpm, max HR of 135 bpm, and avg HR of 93 bpm. Predominant underlying rhythm was Sinus Rhythm. No VT, SVT, atrial fibrillation, high degree block, or pauses noted. Isolated SVEs were rare (<1.0%)t. Isolated VEs were frequent (26.1%).  EKG:  EKG is personally reviewed. 05/04/2022:  EKG was not ordered. 01/26/2022: not ordered   12/15/2021: EKG was not ordered. 09/10/2021: sinus rhythm with frequent PVCs, 94 bpm  Recent Labs: 01/05/2022: ALT 14 01/14/2022: BUN 9; Creatinine, Ser 0.76; Hemoglobin 13.4; Platelets 295; Potassium 4.0; Sodium 141 01/18/2022: TSH 1.80   Recent Lipid Panel    Component Value Date/Time   CHOL 193 12/29/2021 1448   TRIG 77 12/29/2021 1448   HDL 37 (L) 12/29/2021 1448   CHOLHDL 5.2 (H) 12/29/2021 1448   CHOLHDL 4 07/04/2017 0830    VLDL 13.8 07/04/2017 0830   LDLCALC 142 (H) 12/29/2021 1448    Physical Exam:    VS:  BP (!) 134/92 (BP Location: Right Arm, Patient Position: Sitting, Cuff Size: Large)   Pulse 75   Ht '5\' 6"'  (1.676 m)   Wt 222 lb 11.2 oz (101 kg)   SpO2 95%   BMI 35.94 kg/m     Wt Readings from Last 3 Encounters:  05/04/22 222 lb 11.2 oz (101 kg)  01/26/22 221 lb 3.2 oz (100.3 kg)  01/18/22 222 lb 3.2 oz (100.8 kg)    GEN: Well nourished, well developed in no acute distress HEENT: Normal, moist mucous membranes NECK: No JVD CARDIAC: regular rhythm, normal S1 and S2, no rubs or gallops. No murmur. VASCULAR: Radial and DP pulses 2+ bilaterally. No carotid bruits RESPIRATORY:  Clear to auscultation without rales, wheezing  or rhonchi  ABDOMEN: Soft, non-tender, non-distended MUSCULOSKELETAL:  Ambulates independently SKIN: Warm and dry, no edema NEUROLOGIC:  Alert and oriented x 3. No focal neuro deficits noted. PSYCHIATRIC:  Normal affect    ASSESSMENT:    1. PVC (premature ventricular contraction)   2. Medication management   3. Family history of premature CAD   4. Primary hypertension   5. Chest pain of uncertain etiology   6. Class 2 obesity due to excess calories without serious comorbidity with body mass index (BMI) of 35.0 to 35.9 in adult     PLAN:    Chest pain Frequent PVCs Family history of heart disease/premature CAD -unable to have CT due to frequent ectopy -echo reassuring -did not tolerate metoprolol. Doing well on verapamil with significant reduction in symptoms -discussed cMRI stress test results, reassuring -counseled on red flag signs that need immediate medical evaluation  Hypertension -on verapamil for PVCs, as above -discussed home monitoring -slightly above goal, but with work towards diet/exercise/weight loss, will continue to monitor for now  Obesity: BMI 35 -she has attempted weight loss without success. With her family history of premature CAD, she  would like to optimize her cardiovascular risk.  -we have previously discussed [place], did teaching together. She has not yet started -has started simvastatin, ordered follow up labs  Cardiac risk counseling and prevention recommendations: family history of premature CAD (mother deceased aged 63) -recommend heart healthy/Mediterranean diet, with whole grains, fruits, vegetable, fish, lean meats, nuts, and olive oil. Limit salt. -recommend moderate walking, 3-5 times/week for 30-50 minutes each session. Aim for at least 150 minutes.week. Goal should be pace of 3 miles/hours, or walking 1.5 miles in 30 minutes -recommend avoidance of tobacco products. Avoid excess alcohol. -ASCVD risk score: The ASCVD Risk score (Arnett DK, et al., 2019) failed to calculate for the following reasons:   The 2019 ASCVD risk score is only valid for ages 72 to 2    Plan for follow up: 1 year or sooner as needed.  Buford Dresser, MD, PhD, [facility]    Medication Adjustments/Labs and Tests Ordered: Current medicines are reviewed at length with the patient today.  Concerns regarding medicines are outlined above.   Orders Placed This Encounter  Procedures   Lipid panel   Hepatic function panel   No orders of the defined types were placed in this encounter.  Patient Instructions  Medication Instructions:  Your Physician recommend you continue on your current medication as directed.    *If you need a refill on your cardiac medications before your next appointment, please call your pharmacy*   Lab Work: Your provider has recommended lab work (fasting lipid, LFT). Please have this collected at [facility] at [place]. The lab is open 8:00 am - 4:30 pm. Please avoid 12:00p - 1:00p for lunch hour. You do not need an appointment. Please go to [address] [place], [place] [address]. This is in the Primary Care office on the 3rd floor, let them know you are  there for blood work and they will direct you to the lab.  If you have labs (blood work) drawn today and your tests are completely normal, you will receive your results only by: [place] (if you have MyChart) OR A paper copy in the mail If you have any lab test that is abnormal or we need to change your treatment, we will call you to review the results.   Testing/Procedures: None ordered today  Follow-Up: At [facility], you and your health needs are our priority.  As part of our continuing mission to provide you with exceptional heart care, we have created designated Provider Care Teams.  These Care Teams include your primary Cardiologist (physician) and Advanced Practice Providers (APPs -  Physician Assistants and Nurse Practitioners) who all work together to provide you with the care you need, when you need it.  We recommend signing up for the patient portal called "MyChart".  Sign up information is provided on this After Visit Summary.  MyChart is used to connect with patients for Virtual Visits (Telemedicine).  Patients are able to view lab/test results, encounter notes, upcoming appointments, etc.  Non-urgent messages can be sent to your provider as well.   To learn more about what you can do with MyChart, go to [email or web address].    Your next appointment:   1 year(s)  The format for your next appointment:   In Person  Provider:   Buford Dresser, MD{          I,Mathew Stumpf,acting as a scribe for Buford Dresser, MD.,have documented all relevant documentation on the behalf of Buford Dresser, MD,as directed by  Buford Dresser, MD while in the presence of Buford Dresser, MD.  I, Buford Dresser, MD, have reviewed all documentation for this visit. The documentation on 05/10/22 for the exam, diagnosis, procedures, and orders are all accurate and complete.   Signed, Buford Dresser, MD PhD 05/04/2022     [place]

## 2023-01-02 ENCOUNTER — Ambulatory Visit: Payer: Managed Care, Other (non HMO) | Admitting: Obstetrics and Gynecology

## 2023-01-18 ENCOUNTER — Other Ambulatory Visit: Payer: Self-pay | Admitting: Internal Medicine

## 2023-01-19 ENCOUNTER — Ambulatory Visit: Payer: Managed Care, Other (non HMO) | Admitting: Internal Medicine

## 2023-01-26 ENCOUNTER — Ambulatory Visit: Payer: Managed Care, Other (non HMO) | Admitting: Internal Medicine

## 2023-01-26 ENCOUNTER — Encounter: Payer: Self-pay | Admitting: Internal Medicine

## 2023-01-26 VITALS — BP 130/82 | HR 88 | Ht 66.0 in | Wt 229.8 lb

## 2023-01-26 DIAGNOSIS — E041 Nontoxic single thyroid nodule: Secondary | ICD-10-CM | POA: Diagnosis not present

## 2023-01-26 DIAGNOSIS — E890 Postprocedural hypothyroidism: Secondary | ICD-10-CM | POA: Diagnosis not present

## 2023-01-26 NOTE — Progress Notes (Addendum)
Patient ID: Audrey Stafford, female   DOB: [birth year], 42 y.o.   MRN: [id number]   HPI  Audrey Stafford is a 42 y.o.-year-old female, initially referred by her PCP, Nafziger, Tommi Rumps, NP, returning for follow-up for a toxic thyroid nodule, now with post ablative hypothyroidism.  Last visit 1 year ago.  Interim history: No fatigue, cold intolerance, constipation.  She has a weight gain of approximately 8 pounds since last visit. After our last visit, she started [facility], but was not able to continue 2/2 not being covered by her insurance.  Reviewed her TFTs: Lab Results  Component Value Date   TSH 1.80 01/18/2022   TSH 1.680 09/03/2021   TSH 5.08 07/19/2021   TSH 2.47 12/11/2020   TSH 7.53 (H) 10/20/2020   TSH 4.13 04/17/2020   TSH <0.01 (L) 01/08/2020   TSH 0.05 (L) 10/08/2019   TSH 0.878 11/15/2018   TSH 1.48 11/16/2017   FREET4 0.78 01/18/2022   FREET4 1.33 09/03/2021   FREET4 0.66 07/19/2021   FREET4 0.76 12/11/2020   FREET4 0.62 10/20/2020   FREET4 0.61 04/17/2020   FREET4 0.94 01/08/2020   FREET4 0.81 10/08/2019   FREET4 0.96 11/15/2018   T3FREE 3.4 10/20/2020   T3FREE 3.1 04/17/2020   T3FREE 4.8 (H) 01/08/2020   T3FREE 4.2 10/08/2019  07/23/2009: TSH 1.11  Her antithyroid antibodies were not elevated: Lab Results  Component Value Date   TSI <89 01/08/2020  11/25/2019: TRAb 1.05 (<2)  Reviewed previous imaging test and biopsy results: Thyroid U/S (11/17/2017): 1.6 x 1.4 x 1.2 cm solid hypoechoic left mid lobe nodule      FNA (02/22/20219):  Left thyroid nodule, Fine Needle Aspiration II (smears):      Benign thyroid nodule (Bethesda category 2).      Specimen Adequacy:  Satisfactory for evaluation.  Thyroid U/S (10/14/2019): 3.7 x 2.6 x 2.2 cm solid, isoechoic nodule, significantly increased from last ultrasound Nodule # 1: Prior biopsy: No Location: Left; Mid Maximum size: 3.7 cm; Other 2 dimensions: 2.6 x 2.2 cm, previously, 1.6 x 1.4 x 1.2  cm Composition: solid/almost completely solid (2) Echogenicity: isoechoic (1) Significant change in size (>/= 20% in two dimensions and minimal increase of 2 mm): Yes  **Given size (>/= 2.5 cm) and appearance, fine needle aspiration of this mildly suspicious nodule should be considered based on TI-RADS Criteria.      FNA (11/05/2019): Atypia of unknown significance (Bethesda category 3) Afirma: benign  Thyroid uptake and scan (02/13/2020):  There is moderate, slightly heterogeneous uptake within the dominant left thyroid nodule demonstrated by ultrasound. There is relative suppression of activity throughout the remainder of the thyroid gland.   4 hour I-123 uptake = 19.3% (normal 5-20%)   24 hour I-123 uptake = 40.8% (normal 10-30%)   IMPRESSION: Findings are consistent with a toxic dominant left thyroid nodule, likely an adenoma. Relative suppression of activity in the remainder of the thyroid gland.  RAI treatment (03/16/2020)  She initially had insomnia, heat intolerance, palpitations.  All of these resolved after the RAI treatment.  She developed hypothyroidism after her RAI treatment.  We started levothyroxine 09/2021.  Pt is on levothyroxine 75 mcg daily, taken: - in am - fasting - at least 30 min from b'fast - no calcium - no iron - no multivitamins - no PPIs - not on Biotin  Pt does not have a FH of thyroid ds. + FH of RA in MGM. No FH of thyroid cancer. No h/o radiation tx  to head or neck. No recent steroids use, but use this in the past for allergies.   Pt denies: - feeling nodules in neck - hoarseness - dysphagia - choking She is not able to see her thyroid nodule in the mirror anymore, after RAI treatment.  She also has a history of HTN, HL. On vitamin D supplements.  ROS: + see HPI  I reviewed pt's medications, allergies, PMH, social hx, family hx, and changes were documented in the history of present illness. Otherwise, unchanged from my  initial visit note.  Past Medical History:  Diagnosis Date   Anemia    Arthritis    Knees   Back pain    Chest pain    Constipation    Dysmenorrhea    Family history of breast cancer    Family history of prostate cancer    Family history of throat cancer    GERD (gastroesophageal reflux disease)    Hypertension    Joint pain    Migraine without aura    Multiple food allergies    Milk   Palpitations    Past Surgical History:  Procedure Laterality Date   CESAREAN SECTION  01/11/2005   Social History   Socioeconomic History   Marital status: Significant Other    Spouse name: Atilano Ina   Number of children: 1   Years of education: Not on file   Highest education level: Not on file  Occupational History   Occupation: Engineer, building services   Tobacco Use   Smoking status: Never   Smokeless tobacco: Never  Vaping Use   Vaping Use: Never used  Substance and Sexual Activity   Alcohol use: No   Drug use: No   Sexual activity: Yes    Partners: Male    Birth control/protection: Pill  Other Topics Concern   Not on file  Social History Narrative   She works at [employer].    Not married    1 child ( boy)   Social Determinants of Radio broadcast assistant Strain: Not on file  Food Insecurity: Not on file  Transportation Needs: Not on file  Physical Activity: Not on file  Stress: Not on file  Social Connections: Not on file  Intimate Partner Violence: Not on file   Current Outpatient Medications on File Prior to Visit  Medication Sig Dispense Refill   levonorgestrel (MIRENA) 20 MCG/24HR IUD 1 each by Intrauterine route once.     levothyroxine (SYNTHROID) 75 MCG tablet TAKE 1 TABLET(75 MCG) BY MOUTH DAILY 90 tablet 0   naproxen sodium (ANAPROX DS) 550 MG tablet Take 1 tablet (550 mg total) by mouth 2 (two) times daily with a meal. 30 tablet 2   Semaglutide-Weight Management 0.25 MG/0.5ML SOAJ Inject 0.25 mg into the skin once a week. (Patient not taking:  Reported on 01/26/2022) 2 mL 0   simvastatin (ZOCOR) 10 MG tablet Take 1 tablet (10 mg total) by mouth at bedtime. 90 tablet 3   verapamil (CALAN-SR) 120 MG CR tablet Take 1 tablet (120 mg total) by mouth daily. 90 tablet 3   Vitamin D, Ergocalciferol, (DRISDOL) 1.25 MG (50000 UNIT) CAPS capsule Take 1 capsule (50,000 Units total) by mouth every 7 (seven) days. 12 capsule 3   No current facility-administered medications on file prior to visit.   Allergies  Allergen Reactions   Milk-Related Compounds Swelling   Family History  Problem Relation Age of Onset   Sudden Cardiac Death Mother  MI   Breast cancer Paternal Aunt        dx >50, unconfirmed diagnosis   Hypertension Maternal Grandmother    Diabetes Maternal Grandmother    Hypertension Maternal Aunt    Prostate cancer Maternal Uncle        dx 60s   Throat cancer Paternal Aunt        d. >50, smoker   Breast cancer Paternal Great-grandmother        PGF's mother   PE: BP 130/82 (BP Location: Right Arm, Patient Position: Sitting, Cuff Size: Normal)   Pulse 88   Ht 5\' 6"  (1.676 m)   Wt 229 lb 12.8 oz (104.2 kg)   SpO2 99%   BMI 37.09 kg/m  Wt Readings from Last 3 Encounters:  01/26/23 229 lb 12.8 oz (104.2 kg)  05/04/22 222 lb 11.2 oz (101 kg)  01/26/22 221 lb 3.2 oz (100.3 kg)   Constitutional: overweight, in NAD Eyes:  EOMI, no exophthalmos ENT: no thyromegaly and no thyroid nodule palpated, no cervical lymphadenopathy Cardiovascular: RRR, No MRG Respiratory: CTA B Musculoskeletal: no deformities Skin:no rashes Neurological: no tremor with outstretched hands  ASSESSMENT: 1.  Post ablation hypothyroidism  2.  Left thyroid nodule  PLAN:  1. And 2. 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  She developed hypothyroidism after RAI treatment - latest thyroid labs reviewed with pt. >> normal: Lab Results  Component Value Date   TSH 1.80  01/18/2022  - she continues on LT4 75  mcg daily - pt feels good on this dose, without complaints except for weight gain.  She is adjusting her diet and is exercising. - we discussed about taking the thyroid hormone every day, with water, >30 minutes before breakfast, separated by >4 hours from acid reflux medications, calcium, iron, multivitamins. Pt. is taking it correctly. - will check thyroid tests today: TSH and fT4 - If labs are abnormal, she will need to return for repeat TFTs in 1.5 months - I will see her back in 1 year  2.  Left thyroid nodule -The size of the thyroid nodule decreased after RAI treatment -The nodule was biopsied with an inconclusive result, however, the Afirma molecular marker returned benign, carrying a low risk of thyroid cancer -No neck compression symptoms at today's visit -We will recheck her thyroid ultrasound now  Needs refills.  Component     Latest Ref Rng 01/26/2023  T4,Free(Direct)     0.60 - 1.60 ng/dL 1.61   TSH     0.96 - 0.45 uIU/mL 1.47   TFTs are normal.  Thyroid U/S (02/17/2023): Parenchymal Echotexture: Normal  Isthmus: Normal in size measuring 0.3 cm in diameter, unchanged  Right lobe: Atrophic measuring 4.4 x 1.4 x 1.0 cm, previously, 6.2 x 1.9 x 1.7 cm  Left lobe: Atrophic measuring 3.3 x 1.4 x 0.9 cm, previously, 5.0 x 3.0 x 2.4 cm _________________________________________________________   Estimated total number of nodules >/= 1 cm: 1 _________________________________________________________   Interval decreased in size, cystic degeneration and dystrophic calcification of previously biopsied left-sided thyroid nodule which now measures 1.5 x 1.2 x 0.9 cm, previously, 3.7 x 2.6 x 2.2 cm.   No regional cervical lymphadenopathy.   IMPRESSION: 1. Expected atrophy of the thyroid parenchyma following interval radioactive iodine ablation. No worrisome new or enlarging thyroid nodules. 2. Interval decrease in size, cystic  degeneration and dystrophic calcification of previously biopsied solitary left-sided thyroid nodule,  now appearing predominantly cystic and measuring 1.5 cm, previously, predominantly solid and measuring 3.7 cm. Correlation with previous biopsy results is advised. Assuming a benign pathologic diagnosis, repeat sampling and/or continued dedicated follow-up is not recommended.  Carlus Pavlov, MD PhD [facility]

## 2023-01-26 NOTE — Patient Instructions (Signed)
Please continue Levothyroxine 75 mcg daily.  Take the thyroid hormone every day, with water, at least 30 minutes before breakfast, separated by at least 4 hours from: - acid reflux medications - calcium - iron - multivitamins  Please stop at the lab.  Please return in 1 year. 

## 2023-01-27 LAB — T4, FREE: Free T4: 0.96 ng/dL (ref 0.60–1.60)

## 2023-01-27 LAB — TSH: TSH: 1.47 u[IU]/mL (ref 0.35–5.50)

## 2023-01-27 MED ORDER — LEVOTHYROXINE SODIUM 75 MCG PO TABS: ORAL_TABLET | ORAL | 3 refills | Status: DC

## 2023-02-09 NOTE — Progress Notes (Unsigned)
42 y.o. G1P1001 Significant Other Black or African American Not Hispanic or Latino female here for annual exam.  She has a mirena IUD, placed in 6/19. No regular cycles, just random spotting. In the last several months she has spotted for up to 2 weeks.  Lives with her long term partner, no dyspareunia.   She has a strong FH of breast cancer on her Fathers side. She was seen by Genetics,  not c/w hereditary. TC risk between 8.6-13% depending if PA had breast cancer.    H/O thyroid nodule, on synthroid. She is followed by Endocrinology.   She is no longer taking Vitamin D.     Patient's last menstrual period was 02/06/2023.          Sexually active: Yes.    The current method of family planning is IUD.    Exercising: Yes.     Walking  Smoker:  no  Health Maintenance: Pap:  12/29/21 WNL Hr HPV neg, 11/02/17 Neg HR HPV Neg, 03/08/13 Neg  History of abnormal Pap:  no MMG:  none, scheduled next week.   BMD:   none  Colonoscopy: none  TDaP:  11/01/17  Gardasil: complete    reports that she has never smoked. She has never used smokeless tobacco. She reports that she does not drink alcohol and does not use drugs. Son is 18. She works for [employer].  Past Medical History:  Diagnosis Date   Anemia    Arthritis    Knees   Back pain    Chest pain    Constipation    Dysmenorrhea    Family history of breast cancer    Family history of prostate cancer    Family history of throat cancer    GERD (gastroesophageal reflux disease)    Hypertension    Joint pain    Migraine without aura    Multiple food allergies    Milk   Palpitations     Past Surgical History:  Procedure Laterality Date   CESAREAN SECTION  01/11/2005    Current Outpatient Medications  Medication Sig Dispense Refill   levonorgestrel (MIRENA) 20 MCG/24HR IUD 1 each by Intrauterine route once.     levothyroxine (SYNTHROID) 75 MCG tablet TAKE 1 TABLET(75 MCG) BY MOUTH DAILY 90 tablet 3   naproxen sodium (ANAPROX DS) 550 MG  tablet Take 1 tablet (550 mg total) by mouth 2 (two) times daily with a meal. 30 tablet 2   simvastatin (ZOCOR) 10 MG tablet Take 1 tablet (10 mg total) by mouth at bedtime. 90 tablet 3   verapamil (CALAN-SR) 120 MG CR tablet Take 1 tablet (120 mg total) by mouth daily. 90 tablet 3   No current facility-administered medications for this visit.    Family History  Problem Relation Age of Onset   Sudden Cardiac Death Mother        MI   Breast cancer Paternal Aunt        dx >50, unconfirmed diagnosis   Hypertension Maternal Grandmother    Diabetes Maternal Grandmother    Hypertension Maternal Aunt    Prostate cancer Maternal Uncle        dx 20s   Throat cancer Paternal Aunt        d. >50, smoker   Breast cancer Paternal Great-grandmother        PGF's mother    Review of Systems  All other systems reviewed and are negative.   Exam:   BP 124/72   Pulse 68  Ht 5' 5.25" (1.657 m)   Wt 224 lb (101.6 kg)   LMP 02/06/2023   SpO2 100%   BMI 36.99 kg/m   Weight change: @ Height:   Height: 5' 5.25" (165.7 cm)  Ht Readings from Last 3 Encounters:  02/15/23 5' 5.25" (1.657 m)  01/26/23  (1.676 m)  05/04/22  (1.676 m)    General appearance: alert, cooperative and appears stated age Head: Normocephalic, without obvious abnormality, atraumatic Neck: no adenopathy, supple, symmetrical, trachea midline and thyroid normal to inspection and palpation Lungs: clear to auscultation bilaterally Cardiovascular: regular rate and rhythm Breasts: normal appearance, no masses or tenderness Abdomen: soft, non-tender; non distended,  no masses,  no organomegaly Extremities: extremities normal, atraumatic, no cyanosis or edema Skin: Skin color, texture, turgor normal. No rashes or lesions Lymph nodes: Cervical, supraclavicular, and axillary nodes normal. No abnormal inguinal nodes palpated Neurologic: Grossly normal   Pelvic: External genitalia:  no lesions               Urethra:  normal appearing urethra with no masses, tenderness or lesions              Bartholins and Skenes: normal                 Vagina: normal appearing vagina with normal color and discharge, no lesions              Cervix: no cervical motion tenderness, no lesions, and not friable, IUD strings not seen               Bimanual Exam:  Uterus:   no masses or tenderness              Adnexa: no mass, fullness, tenderness               Rectovaginal: Confirms               Anus:  normal sphincter tone, no lesions  Carolynn Serve, CMA chaperoned for the exam.  1. Well woman exam Discussed breast self exam Discussed calcium and vit D intake Mammogram scheduled Pap is utd Labs with primary  2. IUD check up Strings missing, in place on prior u/s  3. Breakthrough bleeding with IUD Normal exam - naproxen sodium (ANAPROX DS) 550 MG tablet; Take one tablet po BID x 5-7 days, then take it BID as needed  Dispense: 30 tablet; Refill: 1 -She will call if the BTB persists

## 2023-02-13 ENCOUNTER — Other Ambulatory Visit: Payer: Self-pay | Admitting: Obstetrics and Gynecology

## 2023-02-13 DIAGNOSIS — Z1231 Encounter for screening mammogram for malignant neoplasm of breast: Secondary | ICD-10-CM

## 2023-02-15 ENCOUNTER — Ambulatory Visit (INDEPENDENT_AMBULATORY_CARE_PROVIDER_SITE_OTHER): Payer: Managed Care, Other (non HMO) | Admitting: Obstetrics and Gynecology

## 2023-02-15 ENCOUNTER — Encounter: Payer: Self-pay | Admitting: Obstetrics and Gynecology

## 2023-02-15 VITALS — BP 124/72 | HR 68 | Ht 65.25 in | Wt 224.0 lb

## 2023-02-15 DIAGNOSIS — N921 Excessive and frequent menstruation with irregular cycle: Secondary | ICD-10-CM

## 2023-02-15 DIAGNOSIS — Z975 Presence of (intrauterine) contraceptive device: Secondary | ICD-10-CM

## 2023-02-15 DIAGNOSIS — Z30431 Encounter for routine checking of intrauterine contraceptive device: Secondary | ICD-10-CM

## 2023-02-15 DIAGNOSIS — Z01419 Encounter for gynecological examination (general) (routine) without abnormal findings: Secondary | ICD-10-CM

## 2023-02-15 MED ORDER — NAPROXEN SODIUM 550 MG PO TABS: ORAL_TABLET | ORAL | 1 refills | Status: DC

## 2023-02-15 NOTE — Patient Instructions (Signed)

## 2023-02-17 ENCOUNTER — Ambulatory Visit
Admission: RE | Admit: 2023-02-17 | Discharge: 2023-02-17 | Disposition: A | Discharge status: Home / Self Care | Payer: Managed Care, Other (non HMO) | Source: Ambulatory Visit | Attending: Internal Medicine | Admitting: Internal Medicine

## 2023-02-17 DIAGNOSIS — E041 Nontoxic single thyroid nodule: Secondary | ICD-10-CM

## 2023-02-24 ENCOUNTER — Ambulatory Visit
Admission: RE | Admit: 2023-02-24 | Discharge: 2023-02-24 | Disposition: A | Discharge status: Home / Self Care | Payer: Managed Care, Other (non HMO) | Source: Ambulatory Visit | Attending: Obstetrics and Gynecology | Admitting: Obstetrics and Gynecology

## 2023-02-24 DIAGNOSIS — Z1231 Encounter for screening mammogram for malignant neoplasm of breast: Secondary | ICD-10-CM

## 2023-03-13 ENCOUNTER — Other Ambulatory Visit: Payer: Self-pay | Admitting: Adult Health

## 2023-03-14 NOTE — Telephone Encounter (Signed)
Patient need to schedule a CPE ov for more refills. 30 day will be refilled.

## 2023-04-14 ENCOUNTER — Other Ambulatory Visit (HOSPITAL_BASED_OUTPATIENT_CLINIC_OR_DEPARTMENT_OTHER): Payer: Self-pay | Admitting: Cardiology

## 2023-04-14 DIAGNOSIS — I493 Ventricular premature depolarization: Secondary | ICD-10-CM

## 2023-04-14 NOTE — Telephone Encounter (Signed)
Rx(s) sent to pharmacy electronically.  

## 2023-06-02 ENCOUNTER — Other Ambulatory Visit: Payer: Self-pay | Admitting: Adult Health

## 2023-06-06 NOTE — Telephone Encounter (Signed)
Tried to call pt to advised that she needs a CPE for refills. However, pt did not answer.

## 2023-06-09 ENCOUNTER — Encounter: Payer: Self-pay | Admitting: Adult Health

## 2023-06-09 ENCOUNTER — Ambulatory Visit: Payer: Managed Care, Other (non HMO) | Admitting: Adult Health

## 2023-06-09 VITALS — BP 128/80 | HR 77 | Temp 98.2°F | Ht 67.0 in | Wt 222.8 lb

## 2023-06-09 DIAGNOSIS — Z6830 Body mass index (BMI) 30.0-30.9, adult: Secondary | ICD-10-CM

## 2023-06-09 DIAGNOSIS — E039 Hypothyroidism, unspecified: Secondary | ICD-10-CM

## 2023-06-09 DIAGNOSIS — I493 Ventricular premature depolarization: Secondary | ICD-10-CM

## 2023-06-09 DIAGNOSIS — E782 Mixed hyperlipidemia: Secondary | ICD-10-CM | POA: Diagnosis not present

## 2023-06-09 DIAGNOSIS — Z0000 Encounter for general adult medical examination without abnormal findings: Secondary | ICD-10-CM | POA: Diagnosis not present

## 2023-06-09 DIAGNOSIS — E669 Obesity, unspecified: Secondary | ICD-10-CM

## 2023-06-09 MED ORDER — SIMVASTATIN 10 MG PO TABS: 10.0000 mg | ORAL_TABLET | Freq: Every day | ORAL | 3 refills | Status: DC

## 2023-06-09 NOTE — Progress Notes (Signed)
Subjective:    Patient ID: Audrey Stafford, female    DOB: 1980/11/05, 42 y.o.   MRN: [id number]  HPI   Patient presents for yearly preventative medicine examination. She is a pleasant 42 year old female who  has a past medical history of Anemia, Arthritis, Back pain, Chest pain, Constipation, Dysmenorrhea, Family history of breast cancer, Family history of prostate cancer, Family history of throat cancer, GERD (gastroesophageal reflux disease), Hypertension, Joint pain, Migraine without aura, Multiple food allergies, and Palpitations.  Postablative hypothyroidism  -is on Synthroid 75 mcg.  Is followed by endocrinology Lab Results  Component Value Date   TSH 1.47 01/26/2023   PVCs-seen by cardiology.  Prescribed Verapamil.  Will have occasional palpitations but these only last a second overall generally much improved since starting verapamil.  Hyperlipidemia-  Prescribed Simvastatin 10 mg daily. She denies myalgia or fatigue.  Lab Results  Component Value Date   CHOL 193 12/29/2021   HDL 37 (L) 12/29/2021   LDLCALC 142 (H) 12/29/2021   TRIG 77 12/29/2021   CHOLHDL 5.2 (H) 12/29/2021   Obesity - continue to work on diet and goes to the gym frequently but is not able to lose weight  Wt Readings from Last 3 Encounters:  06/09/23 222 lb 12.8 oz (101.1 kg)  02/15/23 224 lb (101.6 kg)  01/26/23 229 lb 12.8 oz (104.2 kg)    All immunizations and health maintenance protocols were reviewed with the patient and needed orders were placed.  Appropriate screening laboratory values were ordered for the patient including screening of hyperlipidemia, renal function and hepatic function.  Medication reconciliation,  past medical history, social history, problem list and allergies were reviewed in detail with the patient  Goals were established with regard to weight loss, exercise, and  diet in compliance with medications. She does go to the gym and goes to the park and walk. She does  work from home and snack a lot.    Wt Readings from Last 3 Encounters:  06/09/23 222 lb 12.8 oz (101.1 kg)  02/15/23 224 lb (101.6 kg)  01/26/23 229 lb 12.8 oz (104.2 kg)   Review of Systems  Constitutional: Negative.   HENT: Negative.    Eyes: Negative.   Respiratory: Negative.    Cardiovascular: Negative.   Gastrointestinal: Negative.   Endocrine: Negative.   Genitourinary: Negative.   Musculoskeletal: Negative.   Skin: Negative.   Allergic/Immunologic: Negative.   Neurological: Negative.   Hematological: Negative.   Psychiatric/Behavioral: Negative.      Past Medical History:  Diagnosis Date   Anemia    Arthritis    Knees   Back pain    Chest pain    Constipation    Dysmenorrhea    Family history of breast cancer    Family history of prostate cancer    Family history of throat cancer    GERD (gastroesophageal reflux disease)    Hypertension    Joint pain    Migraine without aura    Multiple food allergies    Milk   Palpitations     Social History   Socioeconomic History   Marital status: Significant Other    Spouse name: Henderson Cloud   Number of children: 1   Years of education: Not on file   Highest education level: Bachelor's degree (e.g., BA, AB, BS)  Occupational History   Occupation: Nurse, children's   Tobacco Use   Smoking status: Never   Smokeless tobacco: Never  Vaping Use  Vaping status: Never Used  Substance and Sexual Activity   Alcohol use: No   Drug use: No   Sexual activity: Yes    Partners: Male    Birth control/protection: Pill  Other Topics Concern   Not on file  Social History Narrative   She works at [employer].    Not married    1 child ( boy)   Social Determinants of Health   Financial Resource Strain: Low Risk  (06/08/2023)   Overall Financial Resource Strain (CARDIA)    Difficulty of Paying Living Expenses: Not hard at all  Food Insecurity: No Food Insecurity (06/08/2023)   Hunger Vital Sign    Worried  About Running Out of Food in the Last Year: Never true    Ran Out of Food in the Last Year: Never true  Transportation Needs: No Transportation Needs (06/08/2023)   PRAPARE - Administrator, Civil Service (Medical): No    Lack of Transportation (Non-Medical): No  Physical Activity: Insufficiently Active (06/08/2023)   Exercise Vital Sign    Days of Exercise per Week: 3 days    Minutes of Exercise per Session: 30 min  Stress: No Stress Concern Present (06/08/2023)   [employer] of Occupational Health - Occupational Stress Questionnaire    Feeling of Stress : Only a little  Social Connections: Moderately Integrated (06/08/2023)   Social Connection and Isolation Panel [NHANES]    Frequency of Communication with Friends and Family: More than three times a week    Frequency of Social Gatherings with Friends and Family: Once a week    Attends Religious Services: More than 4 times per year    Active Member of [employer] or Organizations: No    Attends Engineer, structural: Not on file    Marital Status: Living with partner  Intimate Partner Violence: Unknown (01/25/2022)   Received from [facility], [facility]   HITS    Physically Hurt: Not on file    Insult or Talk Down To: Not on file    Threaten Physical Harm: Not on file    Scream or Curse: Not on file    Past Surgical History:  Procedure Laterality Date   CESAREAN SECTION  01/11/2005    Family History  Problem Relation Age of Onset   Sudden Cardiac Death Mother        MI   Breast cancer Paternal Aunt        dx >50, unconfirmed diagnosis   Hypertension Maternal Grandmother    Diabetes Maternal Grandmother    Hypertension Maternal Aunt    Prostate cancer Maternal Uncle        dx 60s   Throat cancer Paternal Aunt        d. >50, smoker   Breast cancer Paternal Great-grandmother        PGF's mother    Allergies  Allergen Reactions   Milk-Related Compounds Swelling    Current Outpatient Medications  on File Prior to Visit  Medication Sig Dispense Refill   levothyroxine (SYNTHROID) 75 MCG tablet TAKE 1 TABLET(75 MCG) BY MOUTH DAILY 90 tablet 3   naproxen sodium (ANAPROX DS) 550 MG tablet Take one tablet po BID x 5-7 days, then take it BID as needed 30 tablet 1   verapamil (CALAN-SR) 120 MG CR tablet TAKE 1 TABLET(120 MG) BY MOUTH DAILY 90 tablet 0   levonorgestrel (MIRENA) 20 MCG/24HR IUD 1 each by Intrauterine route once.     No current facility-administered medications  on file prior to visit.    BP 128/80 (BP Location: Right Arm, Patient Position: Sitting, Cuff Size: Large)   Pulse 77   Temp 98.2 F (36.8 C) (Oral)   Ht 5\' 7"  (1.702 m)   Wt 222 lb 12.8 oz (101.1 kg)   SpO2 97%   BMI 34.90 kg/m       Objective:   Physical Exam Vitals and nursing note reviewed.  Constitutional:      General: She is not in acute distress.    Appearance: Normal appearance. She is well-developed. She is obese. She is not ill-appearing.  HENT:     Head: Normocephalic and atraumatic.     Right Ear: Tympanic membrane, ear canal and external ear normal. There is no impacted cerumen.     Left Ear: Tympanic membrane, ear canal and external ear normal. There is no impacted cerumen.     Nose: Nose normal. No congestion or rhinorrhea.     Mouth/Throat:     Mouth: Mucous membranes are moist.     Pharynx: Oropharynx is clear. No oropharyngeal exudate or posterior oropharyngeal erythema.  Eyes:     General:        Right eye: No discharge.        Left eye: No discharge.     Extraocular Movements: Extraocular movements intact.     Conjunctiva/sclera: Conjunctivae normal.     Pupils: Pupils are equal, round, and reactive to light.  Neck:     Thyroid: No thyromegaly.     Vascular: No carotid bruit.     Trachea: No tracheal deviation.  Cardiovascular:     Rate and Rhythm: Normal rate and regular rhythm. Frequent Extrasystoles are present.    Pulses: Normal pulses.     Heart sounds: Normal heart  sounds. No murmur heard.    No friction rub. No gallop.  Pulmonary:     Effort: Pulmonary effort is normal. No respiratory distress.     Breath sounds: Normal breath sounds. No stridor. No wheezing, rhonchi or rales.  Chest:     Chest wall: No tenderness.  Abdominal:     General: Abdomen is flat. Bowel sounds are normal. There is no distension.     Palpations: Abdomen is soft. There is no mass.     Tenderness: There is no abdominal tenderness. There is no right CVA tenderness, left CVA tenderness, guarding or rebound.     Hernia: No hernia is present.  Musculoskeletal:        General: No swelling, tenderness, deformity or signs of injury. Normal range of motion.     Cervical back: Normal range of motion and neck supple.     Right lower leg: No edema.     Left lower leg: No edema.  Lymphadenopathy:     Cervical: No cervical adenopathy.  Skin:    General: Skin is warm and dry.     Coloration: Skin is not jaundiced or pale.     Findings: No bruising, erythema, lesion or rash.  Neurological:     General: No focal deficit present.     Mental Status: She is alert and oriented to person, place, and time.     Cranial Nerves: No cranial nerve deficit.     Sensory: No sensory deficit.     Motor: No weakness.     Coordination: Coordination normal.     Gait: Gait normal.     Deep Tendon Reflexes: Reflexes normal.  Psychiatric:  Mood and Affect: Mood normal.        Behavior: Behavior normal.        Thought Content: Thought content normal.        Judgment: Judgment normal.       Assessment & Plan:   1. Routine general medical examination at a health care facility Today patient counseled on age appropriate routine health concerns for screening and prevention, each reviewed and up to date or declined. Immunizations reviewed and up to date or declined. Labs ordered and reviewed. Risk factors for depression reviewed and negative. Hearing function and visual acuity are intact. ADLs  screened and addressed as needed. Functional ability and level of safety reviewed and appropriate. Education, counseling and referrals performed based on assessed risks today. Patient provided with a copy of personalized plan for preventive services. - Follow up in one year or sooner if needed - Work on weight loss through diet and exercise   2. PVC (premature ventricular contraction) - Continue with Verapamil  - CBC with Differential/Platelet; Future - Comprehensive metabolic panel; Future - Lipid panel; Future - TSH; Future  3. Mixed hyperlipidemia - Continue with Zocor 10 mg  - CBC with Differential/Platelet; Future - Comprehensive metabolic panel; Future - Lipid panel; Future - TSH; Future - simvastatin (ZOCOR) 10 MG tablet; Take 1 tablet (10 mg total) by mouth daily.  Dispense: 90 tablet; Refill: 3  4. Hypothyroidism, unspecified type - Per Endocrinology  - CBC with Differential/Platelet; Future - Comprehensive metabolic panel; Future - Lipid panel; Future - TSH; Future  5. Class 1 obesity with serious comorbidity and body mass index (BMI) of 30.0 to 30.9 in adult, unspecified obesity type - Continue to exercise and eat healthy  - Cut back on unhealthy snacks - CBC with Differential/Platelet; Future - Comprehensive metabolic panel; Future - Lipid panel; Future - TSH; Future - Hemoglobin A1c; Future

## 2023-06-09 NOTE — Patient Instructions (Signed)
It was great seeing you today   We will follow up with you regarding your lab work   Please let me know if you need anything   

## 2023-06-21 ENCOUNTER — Other Ambulatory Visit: Payer: Managed Care, Other (non HMO)

## 2023-07-06 ENCOUNTER — Other Ambulatory Visit: Payer: Managed Care, Other (non HMO)

## 2023-07-06 DIAGNOSIS — E039 Hypothyroidism, unspecified: Secondary | ICD-10-CM

## 2023-07-06 DIAGNOSIS — E669 Obesity, unspecified: Secondary | ICD-10-CM

## 2023-07-06 DIAGNOSIS — E782 Mixed hyperlipidemia: Secondary | ICD-10-CM

## 2023-07-06 DIAGNOSIS — I493 Ventricular premature depolarization: Secondary | ICD-10-CM

## 2023-07-07 LAB — CBC WITH DIFFERENTIAL/PLATELET
Basophils Absolute: 0.1 10*3/uL (ref 0.0–0.2)
Basos: 1 %
EOS (ABSOLUTE): 0.1 10*3/uL (ref 0.0–0.4)
Eos: 2 %
Hematocrit: 42.1 % (ref 34.0–46.6)
Hemoglobin: 13.2 g/dL (ref 11.1–15.9)
Immature Grans (Abs): 0 10*3/uL (ref 0.0–0.1)
Immature Granulocytes: 0 %
Lymphocytes Absolute: 1.7 10*3/uL (ref 0.7–3.1)
Lymphs: 37 %
MCH: 29.3 pg (ref 26.6–33.0)
MCHC: 31.4 g/dL — ABNORMAL LOW (ref 31.5–35.7)
MCV: 94 fL (ref 79–97)
Monocytes Absolute: 0.6 10*3/uL (ref 0.1–0.9)
Monocytes: 14 %
Neutrophils Absolute: 2 10*3/uL (ref 1.4–7.0)
Neutrophils: 46 %
Platelets: 297 10*3/uL (ref 150–450)
RBC: 4.5 x10E6/uL (ref 3.77–5.28)
RDW: 13 % (ref 11.7–15.4)
WBC: 4.4 10*3/uL (ref 3.4–10.8)

## 2023-07-07 LAB — COMPREHENSIVE METABOLIC PANEL
ALT: 14 IU/L (ref 0–32)
AST: 16 IU/L (ref 0–40)
Albumin: 4.3 g/dL (ref 3.9–4.9)
Alkaline Phosphatase: 94 IU/L (ref 44–121)
BUN/Creatinine Ratio: 12 (ref 9–23)
BUN: 8 mg/dL (ref 6–24)
Bilirubin Total: 0.3 mg/dL (ref 0.0–1.2)
CO2: 22 mmol/L (ref 20–29)
Calcium: 9.4 mg/dL (ref 8.7–10.2)
Chloride: 101 mmol/L (ref 96–106)
Creatinine, Ser: 0.66 mg/dL (ref 0.57–1.00)
Globulin, Total: 2.6 g/dL (ref 1.5–4.5)
Glucose: 88 mg/dL (ref 70–99)
Potassium: 4.3 mmol/L (ref 3.5–5.2)
Sodium: 138 mmol/L (ref 134–144)
Total Protein: 6.9 g/dL (ref 6.0–8.5)
eGFR: 114 mL/min/{1.73_m2} (ref >59)

## 2023-07-07 LAB — HEMOGLOBIN A1C
Est. average glucose Bld gHb Est-mCnc: 117 mg/dL
Hgb A1c MFr Bld: 5.7 % — ABNORMAL HIGH (ref 4.8–5.6)

## 2023-07-07 LAB — TSH: TSH: 2.94 u[IU]/mL (ref 0.450–4.500)

## 2023-07-07 LAB — LIPID PANEL
Chol/HDL Ratio: 3.9 ratio (ref 0.0–4.4)
Cholesterol, Total: 141 mg/dL (ref 100–199)
HDL: 36 mg/dL — ABNORMAL LOW (ref >39)
LDL Chol Calc (NIH): 92 mg/dL (ref 0–99)
Triglycerides: 62 mg/dL (ref 0–149)
VLDL Cholesterol Cal: 13 mg/dL (ref 5–40)

## 2023-08-28 ENCOUNTER — Encounter (HOSPITAL_BASED_OUTPATIENT_CLINIC_OR_DEPARTMENT_OTHER): Payer: Self-pay | Admitting: Cardiology

## 2023-08-28 ENCOUNTER — Ambulatory Visit (HOSPITAL_BASED_OUTPATIENT_CLINIC_OR_DEPARTMENT_OTHER): Payer: Managed Care, Other (non HMO) | Admitting: Cardiology

## 2023-08-28 VITALS — BP 126/78 | HR 73 | Ht 66.0 in | Wt 223.4 lb

## 2023-08-28 DIAGNOSIS — Z8249 Family history of ischemic heart disease and other diseases of the circulatory system: Secondary | ICD-10-CM | POA: Diagnosis not present

## 2023-08-28 DIAGNOSIS — I493 Ventricular premature depolarization: Secondary | ICD-10-CM | POA: Diagnosis not present

## 2023-08-28 DIAGNOSIS — Z6836 Body mass index (BMI) 36.0-36.9, adult: Secondary | ICD-10-CM

## 2023-08-28 DIAGNOSIS — E6609 Other obesity due to excess calories: Secondary | ICD-10-CM

## 2023-08-28 DIAGNOSIS — I10 Essential (primary) hypertension: Secondary | ICD-10-CM

## 2023-08-28 DIAGNOSIS — K219 Gastro-esophageal reflux disease without esophagitis: Secondary | ICD-10-CM | POA: Diagnosis not present

## 2023-08-28 DIAGNOSIS — E66812 Obesity, class 2: Secondary | ICD-10-CM

## 2023-08-28 DIAGNOSIS — Z7189 Other specified counseling: Secondary | ICD-10-CM

## 2023-08-28 MED ORDER — VERAPAMIL HCL ER 120 MG PO TBCR: 120.0000 mg | EXTENDED_RELEASE_TABLET | Freq: Every day | ORAL | 3 refills | Status: DC

## 2023-08-28 NOTE — Patient Instructions (Signed)

## 2023-08-28 NOTE — Progress Notes (Signed)
Cardiology Office Note:  .   Date:  08/28/2023  ID:  Audrey Stafford, DOB Dec 08, 1980, MRN [id number] PCP: Shirline Frees, NP  [facility] Providers Cardiologist:  Jodelle Red, MD {  History of Present Illness: .   Audrey Stafford is a 42 y.o. female with a hx of hypertension, hyperthyroidism, GERD, anemia, and arthritis, who is seen for follow-up. I initially met her 09/10/2021 as a new consult at the request of Shirline Frees, NP for the evaluation and management of chest pain.   Pertinent CV history: PVCs noted, was not improved with metoprolol, but tolerated verapamil.  Today: Palpitations well controlled on verapamil. Has noted upper abdominal pain in the last few days. Tried pepcid and this helped. Not related to exertion, lasts about 30 minutes. No associated symptoms. Feels better when she sits up, wakes her from sleep. Better when she ate a banana as well. Better with belching.  ROS: Denies shortness of breath at rest or with normal exertion. No PND, orthopnea, LE edema or unexpected weight gain. No syncope or palpitations. ROS otherwise negative except as noted.   Studies Reviewed: Marland Kitchen    EKG:  EKG Interpretation Date/Time:  Monday August 28 2023 08:04:08 EST Ventricular Rate:  73 PR Interval:  202 QRS Duration:  90 QT Interval:  396 QTC Calculation: 436 R Axis:   -1  Text Interpretation: Normal sinus rhythm Nonspecific T wave abnormality When compared with ECG of 20-Jan-2022 11:00, Premature ventricular complexes are no longer Present Confirmed by Jodelle Red [phone]) on 08/28/2023 8:09:44 AM    Physical Exam:   VS:  BP 126/78 (BP Location: Left Arm, Patient Position: Sitting, Cuff Size: Normal)   Pulse 73   Ht 5\' 6"  (1.676 m)   Wt 223 lb 6.4 oz (101.3 kg)   SpO2 97%   BMI 36.06 kg/m    Wt Readings from Last 3 Encounters:  08/28/23 223 lb 6.4 oz (101.3 kg)  06/09/23 222 lb 12.8 oz (101.1 kg)  02/15/23 224 lb (101.6 kg)     GEN: Well nourished, well developed in no acute distress HEENT: Normal, moist mucous membranes NECK: No JVD CARDIAC: regular rhythm, normal S1 and S2, no rubs or gallops. No murmur. VASCULAR: Radial and DP pulses 2+ bilaterally. No carotid bruits RESPIRATORY:  Clear to auscultation without rales, wheezing or rhonchi  ABDOMEN: Soft, non-tender, non-distended MUSCULOSKELETAL:  Ambulates independently SKIN: Warm and dry, no edema NEUROLOGIC:  Alert and oriented x 3. No focal neuro deficits noted. PSYCHIATRIC:  Normal affect    ASSESSMENT AND PLAN: .    Upper abdominal pain -very consistent with GERD, discussed management options -reviewed red flag warning signs that need immediate medical attention  Chest pain Frequent PVCs Family history of heart disease/premature CAD -echo reassuring -did not tolerate metoprolol. Doing well on verapamil with significant reduction in symptoms -cMRI stress test results reassuring -counseled on red flag signs that need immediate medical evaluation   Hypertension -on verapamil for PVCs, as above -discussed home monitoring -working towards diet/exercise/weight loss  Hyperlipidemia -lipids from 9/24 improved, LDL 92, at goal of <100 for primary prevention -continue simvastatin   Obesity: BMI 36 -she has attempted weight loss without success. With her family history of premature CAD, she would like to optimize her cardiovascular risk.  -we have previously discussed GLP1RA, did teaching together. Unfortunately Reginal Lutes was not covered by her insurance  CV risk counseling and prevention -recommend heart healthy/Mediterranean diet, with whole grains, fruits, vegetable, fish, lean meats, nuts,  and olive oil. Limit salt. -recommend moderate walking, 3-5 times/week for 30-50 minutes each session. Aim for at least 150 minutes.week. Goal should be pace of 3 miles/hours, or walking 1.5 miles in 30 minutes -recommend avoidance of tobacco products. Avoid  excess alcohol. -ASCVD risk score: The 10-year ASCVD risk score (Arnett DK, et al., 2019) is: 2.7%   Values used to calculate the score:     Age: 86 years     Sex: Female     Is Non-Hispanic African American: Yes     Diabetic: No     Tobacco smoker: No     Systolic Blood Pressure: 126 mmHg     Is BP treated: Yes     HDL Cholesterol: 36 mg/dL     Total Cholesterol: 141 mg/dL    Dispo: 1 year or sooner as needed  Signed, Jodelle Red, MD   Jodelle Red, MD, PhD, [facility]  [facility]  [place]  [facility] at [facility] [address] [place], [place] [address] [phone]

## 2024-01-03 ENCOUNTER — Ambulatory Visit: Admitting: Adult Health

## 2024-01-03 VITALS — BP 120/64 | HR 61 | Temp 98.0°F | Wt 229.0 lb

## 2024-01-03 DIAGNOSIS — K2100 Gastro-esophageal reflux disease with esophagitis, without bleeding: Secondary | ICD-10-CM

## 2024-01-03 DIAGNOSIS — E66811 Obesity, class 1: Secondary | ICD-10-CM

## 2024-01-03 DIAGNOSIS — Z6836 Body mass index (BMI) 36.0-36.9, adult: Secondary | ICD-10-CM

## 2024-01-03 MED ORDER — PHENTERMINE HCL 15 MG PO CAPS: 15.0000 mg | ORAL_CAPSULE | ORAL | 0 refills | Status: DC

## 2024-01-03 MED ORDER — PANTOPRAZOLE SODIUM 40 MG PO TBEC: 40.0000 mg | DELAYED_RELEASE_TABLET | Freq: Every day | ORAL | 0 refills | Status: DC

## 2024-01-03 NOTE — Progress Notes (Signed)
 Subjective:    Patient ID: Audrey Stafford, female    DOB: December 14, 1980, 43 y.o.   MRN: [id number]  HPI 43 year old female who  has a past medical history of Anemia, Arthritis, Back pain, Chest pain, Constipation, Dysmenorrhea, Family history of breast cancer, Family history of prostate cancer, Family history of throat cancer, GERD (gastroesophageal reflux disease), Hypertension, Joint pain, Migraine without aura, Multiple food allergies, and Palpitations.  She presents to the office today for for 2 separate issues.  First issue is that of worsening acid reflux over the last month.  Reports that when she eats certain foods she will get a burning sensation in her stomach and esophagus and it feels like she is going to vomit food that she just ate.  She does eat a lot of acidic foods, foods higher in fat and spicy foods.  She has tried Pepcid and Tums without much relief.  Furthermore, over the years she has had a hard time losing weight, she has been seen by weight loss clinic in the past, tried various diets including no as well as tried fasting with minimal results.  Her insurance last year did not cover Wegovy or Zepbound.  She is interested in trying phentermine.  She does have a history of PVCs that are well-controlled with Verapamil 120 mg    Review of Systems See HPI   Past Medical History:  Diagnosis Date   Anemia    Arthritis    Knees   Back pain    Chest pain    Constipation    Dysmenorrhea    Family history of breast cancer    Family history of prostate cancer    Family history of throat cancer    GERD (gastroesophageal reflux disease)    Hypertension    Joint pain    Migraine without aura    Multiple food allergies    Milk   Palpitations     Social History   Socioeconomic History   Marital status: Significant Other    Spouse name: Henderson Cloud   Number of children: 1   Years of education: Not on file   Highest education level: Bachelor's degree (e.g.,  BA, AB, BS)  Occupational History   Occupation: Nurse, children's   Tobacco Use   Smoking status: Never   Smokeless tobacco: Never  Vaping Use   Vaping status: Never Used  Substance and Sexual Activity   Alcohol use: No   Drug use: No   Sexual activity: Yes    Partners: Male    Birth control/protection: Pill  Other Topics Concern   Not on file  Social History Narrative   She works at [employer].    Not married    1 child ( boy)   Social Drivers of Corporate investment banker Strain: Low Risk  (12/31/2023)   Overall Financial Resource Strain (CARDIA)    Difficulty of Paying Living Expenses: Not hard at all  Food Insecurity: No Food Insecurity (12/31/2023)   Hunger Vital Sign    Worried About Running Out of Food in the Last Year: Never true    Ran Out of Food in the Last Year: Never true  Transportation Needs: No Transportation Needs (12/31/2023)   PRAPARE - Administrator, Civil Service (Medical): No    Lack of Transportation (Non-Medical): No  Physical Activity: Sufficiently Active (12/31/2023)   Exercise Vital Sign    Days of Exercise per Week: 5 days  Minutes of Exercise per Session: 30 min  Stress: No Stress Concern Present (12/31/2023)   [employer] of Occupational Health - Occupational Stress Questionnaire    Feeling of Stress : Only a little  Social Connections: Moderately Integrated (12/31/2023)   Social Connection and Isolation Panel [NHANES]    Frequency of Communication with Friends and Family: More than three times a week    Frequency of Social Gatherings with Friends and Family: Once a week    Attends Religious Services: 1 to 4 times per year    Active Member of [employer] or Organizations: No    Attends Engineer, structural: Not on file    Marital Status: Living with partner  Intimate Partner Violence: Unknown (01/25/2022)   Received from [facility], [facility]   HITS    Physically Hurt: Not on file    Insult or Talk Down To:  Not on file    Threaten Physical Harm: Not on file    Scream or Curse: Not on file    Past Surgical History:  Procedure Laterality Date   CESAREAN SECTION  01/11/2005    Family History  Problem Relation Age of Onset   Sudden Cardiac Death Mother        MI   Breast cancer Paternal Aunt        dx >50, unconfirmed diagnosis   Hypertension Maternal Grandmother    Diabetes Maternal Grandmother    Hypertension Maternal Aunt    Prostate cancer Maternal Uncle        dx 60s   Throat cancer Paternal Aunt        d. >50, smoker   Breast cancer Paternal Great-grandmother        PGF's mother    Allergies  Allergen Reactions   Milk-Related Compounds Swelling    Current Outpatient Medications on File Prior to Visit  Medication Sig Dispense Refill   famotidine (PEPCID) 20 MG tablet Take 20 mg by mouth as needed for heartburn or indigestion.     levonorgestrel (MIRENA) 20 MCG/24HR IUD 1 each by Intrauterine route once.     levothyroxine (SYNTHROID) 75 MCG tablet TAKE 1 TABLET(75 MCG) BY MOUTH DAILY 90 tablet 3   naproxen sodium (ANAPROX DS) 550 MG tablet Take one tablet po BID x 5-7 days, then take it BID as needed 30 tablet 1   simvastatin (ZOCOR) 10 MG tablet Take 1 tablet (10 mg total) by mouth daily. 90 tablet 3   verapamil (CALAN-SR) 120 MG CR tablet Take 1 tablet (120 mg total) by mouth daily. 90 tablet 3   No current facility-administered medications on file prior to visit.    BP 120/64   Pulse 61   Temp 98 F (36.7 C) (Oral)   Wt 229 lb (103.9 kg)   SpO2 98%   BMI 36.96 kg/m       Objective:   Physical Exam Vitals and nursing note reviewed.  Constitutional:      Appearance: Normal appearance.  Cardiovascular:     Rate and Rhythm: Normal rate and regular rhythm.     Pulses: Normal pulses.     Heart sounds: Normal heart sounds.  Neurological:     General: No focal deficit present.     Mental Status: She is alert and oriented to person, place, and time.   Psychiatric:        Mood and Affect: Mood normal.        Behavior: Behavior normal.  Thought Content: Thought content normal.        Judgment: Judgment normal.        Assessment & Plan:   1. Class 1 obesity (Primary) -I am okay with trialing her on phentermine 15 mg daily.  She understands that she needs to stop this medication if she starts to experience palpitations.  I will have her follow-up in 30 days.  She is exercising with walking but she was encouraged to start doing some more aerobic exercise as well as weight lifting exercises and she needs to clean up her diet significantly. - phentermine 15 MG capsule; Take 1 capsule (15 mg total) by mouth every morning.  Dispense: 30 capsule; Refill: 0  2. BMI 36.0-36.9,adult  - phentermine 15 MG capsule; Take 1 capsule (15 mg total) by mouth every morning.  Dispense: 30 capsule; Refill: 0  3. Gastroesophageal reflux disease with esophagitis without hemorrhage - Will place her on Protonix 40 mg daily x 30 days  - Clean up diet  - pantoprazole (PROTONIX) 40 MG tablet; Take 1 tablet (40 mg total) by mouth daily.  Dispense: 90 tablet; Refill: 0   Shirline Frees, NP  Time spent with patient today was 37 minutes which consisted of chart review, discussing Obesity, and GERD, work up, treatment answering questions and documentation.

## 2024-01-26 ENCOUNTER — Ambulatory Visit: Payer: Managed Care, Other (non HMO) | Admitting: Internal Medicine

## 2024-01-26 ENCOUNTER — Encounter: Payer: Self-pay | Admitting: Internal Medicine

## 2024-01-26 VITALS — BP 120/70 | HR 109 | Ht 66.0 in | Wt 224.0 lb

## 2024-01-26 DIAGNOSIS — E041 Nontoxic single thyroid nodule: Secondary | ICD-10-CM | POA: Diagnosis not present

## 2024-01-26 DIAGNOSIS — E890 Postprocedural hypothyroidism: Secondary | ICD-10-CM | POA: Diagnosis not present

## 2024-01-26 NOTE — Patient Instructions (Signed)
Please continue Levothyroxine 75 mcg daily.  Take the thyroid hormone every day, with water, at least 30 minutes before breakfast, separated by at least 4 hours from: - acid reflux medications - calcium - iron - multivitamins  Please stop at the lab.  Please return in 1 year. 

## 2024-01-26 NOTE — Progress Notes (Addendum)
 Patient ID: Danitra La'Von Eldridge, female   DOB: 1981-07-09, 43 y.o.   MRN: [id number]   HPI  Celsa La'Von Sedlack is a 43 y.o.-year-old female, initially referred by her PCP, Nafziger, Kandee Keen, NP, returning for follow-up for a toxic thyroid nodule, now with post ablative hypothyroidism.  Last visit 1 year ago.  Interim history: No fatigue, cold intolerance, constipation.   Before last visit she gained 8 pounds. She was previously on Wegovy but was not able to continue due to price.  Approximately 3 weeks ago she started on phentermine.  She feels well on it, without palpitations or shortness of breath.  She has heat intolerance, which is not new.  She is less hungry and already lost 5 pounds.  Reviewed her TFTs: Lab Results  Component Value Date   TSH 2.940 07/06/2023   TSH 1.47 01/26/2023   TSH 1.80 01/18/2022   TSH 1.680 09/03/2021   TSH 5.08 07/19/2021   TSH 2.47 12/11/2020   TSH 7.53 (H) 10/20/2020   TSH 4.13 04/17/2020   TSH <0.01 (L) 01/08/2020   TSH 0.05 (L) 10/08/2019   FREET4 0.96 01/26/2023   FREET4 0.78 01/18/2022   FREET4 1.33 09/03/2021   FREET4 0.66 07/19/2021   FREET4 0.76 12/11/2020   FREET4 0.62 10/20/2020   FREET4 0.61 04/17/2020   FREET4 0.94 01/08/2020   FREET4 0.81 10/08/2019   FREET4 0.96 11/15/2018   T3FREE 3.4 10/20/2020   T3FREE 3.1 04/17/2020   T3FREE 4.8 (H) 01/08/2020   T3FREE 4.2 10/08/2019  07/23/2009: TSH 1.11  Her antithyroid antibodies were not elevated: Lab Results  Component Value Date   TSI <89 01/08/2020  11/25/2019: TRAb 1.05 (<2)  Reviewed previous imaging test and biopsy results: Thyroid U/S (11/17/2017): 1.6 x 1.4 x 1.2 cm solid hypoechoic left mid lobe nodule      FNA (02/22/20219):  Left thyroid nodule, Fine Needle Aspiration II (smears):      Benign thyroid nodule (Bethesda category 2).      Specimen Adequacy:  Satisfactory for evaluation.  Thyroid U/S (10/14/2019): 3.7 x 2.6 x 2.2 cm solid, isoechoic nodule,  significantly increased from last ultrasound Nodule # 1: Prior biopsy: No Location: Left; Mid Maximum size: 3.7 cm; Other 2 dimensions: 2.6 x 2.2 cm, previously, 1.6 x 1.4 x 1.2 cm Composition: solid/almost completely solid (2) Echogenicity: isoechoic (1) Significant change in size (>/= 20% in two dimensions and minimal increase of 2 mm): Yes  **Given size (>/= 2.5 cm) and appearance, fine needle aspiration of this mildly suspicious nodule should be considered based on TI-RADS Criteria.      FNA (11/05/2019): Atypia of unknown significance (Bethesda category 3) Afirma: benign  Thyroid uptake and scan (02/13/2020):  There is moderate, slightly heterogeneous uptake within the dominant left thyroid nodule demonstrated by ultrasound. There is relative suppression of activity throughout the remainder of the thyroid gland.   4 hour I-123 uptake = 19.3% (normal 5-20%)   24 hour I-123 uptake = 40.8% (normal 10-30%)   IMPRESSION: Findings are consistent with a toxic dominant left thyroid nodule, likely an adenoma. Relative suppression of activity in the remainder of the thyroid gland.  RAI treatment (03/16/2020)  Thyroid U/S (02/17/2023): Parenchymal Echotexture: Normal  Isthmus: Normal in size measuring 0.3 cm in diameter, unchanged  Right lobe: Atrophic measuring 4.4 x 1.4 x 1.0 cm, previously, 6.2 x 1.9 x 1.7 cm  Left lobe: Atrophic measuring 3.3 x 1.4 x 0.9 cm, previously, 5.0 x 3.0 x 2.4 cm _________________________________________________________  Estimated total number of nodules >/= 1 cm: 1 _________________________________________________________   Interval decreased in size, cystic degeneration and dystrophic calcification of previously biopsied left-sided thyroid nodule which now measures 1.5 x 1.2 x 0.9 cm, previously, 3.7 x 2.6 x 2.2 cm.   No regional cervical lymphadenopathy.   IMPRESSION: 1. Expected atrophy of the thyroid parenchyma following  interval radioactive iodine ablation. No worrisome new or enlarging thyroid nodules. 2. Interval decrease in size, cystic degeneration and dystrophic calcification of previously biopsied solitary left-sided thyroid nodule, now appearing predominantly cystic and measuring 1.5 cm, previously, predominantly solid and measuring 3.7 cm. Correlation with previous biopsy results is advised. Assuming a benign pathologic diagnosis, repeat sampling and/or continued dedicated follow-up is not recommended.  She initially had insomnia, heat intolerance, palpitations.  All of these resolved after the RAI treatment.  She developed hypothyroidism after her RAI treatment.  We started levothyroxine 09/2021.  Pt is on levothyroxine 75 mcg daily, taken: - in am - fasting - at least 30 min from b'fast - no calcium - no iron - no multivitamins - no PPIs - not on Biotin  Pt does not have a FH of thyroid ds. + FH of RA in MGM. No FH of thyroid cancer. No h/o radiation tx to head or neck. No recent steroids use, but use this in the past for allergies.   Pt denies: - feeling nodules in neck - hoarseness - dysphagia - choking She is not able to see her thyroid nodule in the mirror anymore, after RAI treatment.  She also has a history of HTN, HL. On vitamin D supplements.  ROS: + see HPI  I reviewed pt's medications, allergies, PMH, social hx, family hx, and changes were documented in the history of present illness. Otherwise, unchanged from my initial visit note.  Past Medical History:  Diagnosis Date   Anemia    Arthritis    Knees   Back pain    Chest pain    Constipation    Dysmenorrhea    Family history of breast cancer    Family history of prostate cancer    Family history of throat cancer    GERD (gastroesophageal reflux disease)    Hypertension    Joint pain    Migraine without aura    Multiple food allergies    Milk   Palpitations    Past Surgical History:  Procedure  Laterality Date   CESAREAN SECTION  01/11/2005   Social History   Socioeconomic History   Marital status: Significant Other    Spouse name: Henderson Cloud   Number of children: 1   Years of education: Not on file   Highest education level: Bachelor's degree (e.g., BA, AB, BS)  Occupational History   Occupation: Nurse, children's   Tobacco Use   Smoking status: Never   Smokeless tobacco: Never  Vaping Use   Vaping status: Never Used  Substance and Sexual Activity   Alcohol use: No   Drug use: No   Sexual activity: Yes    Partners: Male    Birth control/protection: Pill  Other Topics Concern   Not on file  Social History Narrative   She works at [employer].    Not married    1 child ( boy)   Social Drivers of Corporate investment banker Strain: Low Risk  (12/31/2023)   Overall Financial Resource Strain (CARDIA)    Difficulty of Paying Living Expenses: Not hard at all  Food Insecurity: No Food  Insecurity (12/31/2023)   Hunger Vital Sign    Worried About Running Out of Food in the Last Year: Never true    Ran Out of Food in the Last Year: Never true  Transportation Needs: No Transportation Needs (12/31/2023)   PRAPARE - Administrator, Civil Service (Medical): No    Lack of Transportation (Non-Medical): No  Physical Activity: Sufficiently Active (12/31/2023)   Exercise Vital Sign    Days of Exercise per Week: 5 days    Minutes of Exercise per Session: 30 min  Stress: No Stress Concern Present (12/31/2023)   [employer] of Occupational Health - Occupational Stress Questionnaire    Feeling of Stress : Only a little  Social Connections: Moderately Integrated (12/31/2023)   Social Connection and Isolation Panel [NHANES]    Frequency of Communication with Friends and Family: More than three times a week    Frequency of Social Gatherings with Friends and Family: Once a week    Attends Religious Services: 1 to 4 times per year    Active Member of [employer] or  Organizations: No    Attends Engineer, structural: Not on file    Marital Status: Living with partner  Intimate Partner Violence: Unknown (01/25/2022)   Received from [facility], [facility]   HITS    Physically Hurt: Not on file    Insult or Talk Down To: Not on file    Threaten Physical Harm: Not on file    Scream or Curse: Not on file   Current Outpatient Medications on File Prior to Visit  Medication Sig Dispense Refill   levonorgestrel (MIRENA) 20 MCG/24HR IUD 1 each by Intrauterine route once.     levothyroxine (SYNTHROID) 75 MCG tablet TAKE 1 TABLET(75 MCG) BY MOUTH DAILY 90 tablet 3   naproxen sodium (ANAPROX DS) 550 MG tablet Take one tablet po BID x 5-7 days, then take it BID as needed 30 tablet 1   pantoprazole (PROTONIX) 40 MG tablet Take 1 tablet (40 mg total) by mouth daily. 90 tablet 0   phentermine 15 MG capsule Take 1 capsule (15 mg total) by mouth every morning. 30 capsule 0   simvastatin (ZOCOR) 10 MG tablet Take 1 tablet (10 mg total) by mouth daily. 90 tablet 3   verapamil (CALAN-SR) 120 MG CR tablet Take 1 tablet (120 mg total) by mouth daily. 90 tablet 3   No current facility-administered medications on file prior to visit.   Allergies  Allergen Reactions   Milk-Related Compounds Swelling   Family History  Problem Relation Age of Onset   Sudden Cardiac Death Mother        MI   Breast cancer Paternal Aunt        dx >50, unconfirmed diagnosis   Hypertension Maternal Grandmother    Diabetes Maternal Grandmother    Hypertension Maternal Aunt    Prostate cancer Maternal Uncle        dx 18s   Throat cancer Paternal Aunt        d. >50, smoker   Breast cancer Paternal Great-grandmother        PGF's mother   PE: BP 120/70   Pulse (!) 109   Ht 5\' 6"  (1.676 m)   Wt 224 lb (101.6 kg)   SpO2 96%   BMI 36.15 kg/m  Wt Readings from Last 10 Encounters:  01/26/24 224 lb (101.6 kg)  01/03/24 229 lb (103.9 kg)  08/28/23 223 lb 6.4 oz (101.3 kg)  06/09/23 222 lb 12.8 oz (101.1 kg)  02/15/23 224 lb (101.6 kg)  01/26/23 229 lb 12.8 oz (104.2 kg)  05/04/22 222 lb 11.2 oz (101 kg)  01/26/22 221 lb 3.2 oz (100.3 kg)  01/18/22 222 lb 3.2 oz (100.8 kg)  01/05/22 222 lb (100.7 kg)   Constitutional: overweight, in NAD Eyes:  EOMI, no exophthalmos ENT: no thyromegaly and no thyroid nodule palpated, no cervical lymphadenopathy Cardiovascular: tachycardia at the beginning of the visit, RR, No MRG Respiratory: CTA B Musculoskeletal: no deformities Skin:no rashes Neurological: no tremor with outstretched hands  ASSESSMENT: 1.  Post ablation hypothyroidism  2.  Left thyroid nodule  PLAN:  1. And 2. 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  She developed hypothyroidism after RAI treatment - latest thyroid labs reviewed with pt. >> normal: Lab Results  Component Value Date   TSH 2.940 07/06/2023  - she continues on LT4 75 mcg daily - pt feels good on this dose.  Before last visit she gained 8 pounds.  She was adjusting her diet and exercising.  She was then started on phentermine 3 weeks ago and she already started to lose weight.  She is feeling well on it, but the beginning of the visit, her pulse was 109.  At the end of the visit, the pulse was 85.  She is on verapamil.  We discussed about possibly letting her cardiologist know that she is on phentermine.  She may not need to change the weight loss regimen, especially as this is working well for her - we discussed about taking the thyroid hormone every day, with water, >30 minutes before breakfast, separated by >4 hours from acid reflux medications, calcium, iron, multivitamins. Pt. is taking it correctly.  No missed doses. - will check thyroid tests today: TSH and fT4 - If labs are abnormal, she will need to return for repeat TFTs in 1.5 months - OTW, I will see her back in a year  2.  Left thyroid  nodule -No neck compression symptoms -This nodule was biopsied with inconclusive results in the past but the Afirma molecular marker returned benign, carries a low risk for thyroid cancer -The size of the thyroid nodule decreased after RAI treatment from 3.7 cm to 1.5 cm. -No imaging follow-up is needed for this - Will continue to follow her clinically  Needs refills.  Orders Placed This Encounter  Procedures   TSH   T4, free  She works at [employer].  At today's visit, she agreed to have the labs done through [facility] as she does not have time to go to a [facility] site.  Component     Latest Ref Rng 01/26/2024  T4,Free(Direct)     0.8 - 1.8 ng/dL 1.4   TSH     mIU/L 1.61   TFTs are normal.  Carlus Pavlov, MD PhD [facility]

## 2024-01-27 LAB — TSH: TSH: 0.89 m[IU]/L

## 2024-01-27 LAB — T4, FREE: Free T4: 1.4 ng/dL (ref 0.8–1.8)

## 2024-01-29 ENCOUNTER — Encounter: Payer: Self-pay | Admitting: Internal Medicine

## 2024-01-29 MED ORDER — LEVOTHYROXINE SODIUM 75 MCG PO TABS: ORAL_TABLET | ORAL | 3 refills | Status: AC

## 2024-01-29 NOTE — Addendum Note (Signed)
 Addended by: Carlus Pavlov on: 01/29/2024 10:40 AM   Modules accepted: Orders

## 2024-02-02 ENCOUNTER — Ambulatory Visit: Admitting: Adult Health

## 2024-02-02 VITALS — BP 110/88 | HR 85 | Temp 98.2°F | Ht 66.0 in | Wt 221.0 lb

## 2024-02-02 DIAGNOSIS — Z6836 Body mass index (BMI) 36.0-36.9, adult: Secondary | ICD-10-CM

## 2024-02-02 DIAGNOSIS — K2100 Gastro-esophageal reflux disease with esophagitis, without bleeding: Secondary | ICD-10-CM | POA: Diagnosis not present

## 2024-02-02 DIAGNOSIS — E66811 Obesity, class 1: Secondary | ICD-10-CM | POA: Diagnosis not present

## 2024-02-02 MED ORDER — PHENTERMINE HCL 15 MG PO CAPS
15.0000 mg | ORAL_CAPSULE | ORAL | 0 refills | Status: DC
Start: 2024-02-02 — End: 2024-03-15

## 2024-02-02 NOTE — Progress Notes (Signed)
 Subjective:    Patient ID: Audrey Stafford, female    DOB: 10/21/1981, 43 y.o.   MRN: [id number]  HPI 43 year old female who  has a past medical history of Anemia, Arthritis, Back pain, Chest pain, Constipation, Dysmenorrhea, Family history of breast cancer, Family history of prostate cancer, Family history of throat cancer, GERD (gastroesophageal reflux disease), Hypertension, Joint pain, Migraine without aura, Multiple food allergies, and Palpitations.  She presents to the office today for follow up regarding GERD and Obesity   GERD -when she was seen a month ago she reported worsening acid reflux over the previous month.  When she would eat certain foods she would get a burning sensation in her stomach and esophagus and it felt like she was going to vomit food that she just ate.  She was eating highly acidic foods, foods higher in fat, and spicy foods at this time.  At home she tried Pepcid and Tums without much relief.  We decided to place her on Protonix 40 mg daily a month ago. After taking Protonix for a month she has not had any more GERD like symptoms.   Obesity - over the years she has had a hard time losing weight, she has been seen by weight loss clinic in the past, tried various diets including no as well as tried fasting with minimal results.  Her insurance last year did not cover Wegovy or Zepbound.  She was interested in trying phentermine, so we started her on phentermine 15 mg daily..  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 Since starting Phentermine she has been able to lose about 8 pounds. She no longer is thinking about food or having cravings. She has started eating healthier, tracking her calories in an app and walking. She feels much better overall. She denies side effects.   Wt Readings from Last 3 Encounters:  02/02/24 221 lb (100.2 kg)  01/26/24 224 lb (101.6 kg)  01/03/24 229 lb (103.9 kg)   Review of Systems See HPI    Past Medical History:  Diagnosis Date   Anemia    Arthritis    Knees   Back pain    Chest pain    Constipation    Dysmenorrhea    Family history of breast cancer    Family history of prostate cancer    Family history of throat cancer    GERD (gastroesophageal reflux disease)    Hypertension    Joint pain    Migraine without aura    Multiple food allergies    Milk   Palpitations     Social History   Socioeconomic History   Marital status: Significant Other    Spouse name: Henderson Cloud   Number of children: 1   Years of education: Not on file   Highest education level: Bachelor's degree (e.g., BA, AB, BS)  Occupational History   Occupation: Nurse, children's   Tobacco Use   Smoking status: Never   Smokeless tobacco: Never  Vaping Use   Vaping status: Never Used  Substance and Sexual Activity   Alcohol use: No   Drug use: No   Sexual activity: Yes    Partners: Male    Birth control/protection: Pill  Other Topics Concern   Not on file  Social History Narrative   She works at [employer].    Not married    1 child ( boy)   Social Drivers of Health  Financial Resource Strain: Low Risk  (12/31/2023)   Overall Financial Resource Strain (CARDIA)    Difficulty of Paying Living Expenses: Not hard at all  Food Insecurity: No Food Insecurity (12/31/2023)   Hunger Vital Sign    Worried About Running Out of Food in the Last Year: Never true    Ran Out of Food in the Last Year: Never true  Transportation Needs: No Transportation Needs (12/31/2023)   PRAPARE - Administrator, Civil Service (Medical): No    Lack of Transportation (Non-Medical): No  Physical Activity: Sufficiently Active (12/31/2023)   Exercise Vital Sign    Days of Exercise per Week: 5 days    Minutes of Exercise per Session: 30 min  Stress: No Stress Concern Present (12/31/2023)   [employer] of Occupational Health - Occupational Stress Questionnaire    Feeling of Stress : Only a  little  Social Connections: Moderately Integrated (12/31/2023)   Social Connection and Isolation Panel [NHANES]    Frequency of Communication with Friends and Family: More than three times a week    Frequency of Social Gatherings with Friends and Family: Once a week    Attends Religious Services: 1 to 4 times per year    Active Member of [employer] or Organizations: No    Attends Engineer, structural: Not on file    Marital Status: Living with partner  Intimate Partner Violence: Unknown (01/25/2022)   Received from [facility], [facility]   HITS    Physically Hurt: Not on file    Insult or Talk Down To: Not on file    Threaten Physical Harm: Not on file    Scream or Curse: Not on file    Past Surgical History:  Procedure Laterality Date   CESAREAN SECTION  01/11/2005    Family History  Problem Relation Age of Onset   Sudden Cardiac Death Mother        MI   Breast cancer Paternal Aunt        dx >50, unconfirmed diagnosis   Hypertension Maternal Grandmother    Diabetes Maternal Grandmother    Hypertension Maternal Aunt    Prostate cancer Maternal Uncle        dx 60s   Throat cancer Paternal Aunt        d. >50, smoker   Breast cancer Paternal Great-grandmother        PGF's mother    Allergies  Allergen Reactions   Milk-Related Compounds Swelling    Current Outpatient Medications on File Prior to Visit  Medication Sig Dispense Refill   levonorgestrel (MIRENA) 20 MCG/24HR IUD 1 each by Intrauterine route once.     levothyroxine (SYNTHROID) 75 MCG tablet TAKE 1 TABLET(75 MCG) BY MOUTH DAILY 90 tablet 3   naproxen sodium (ANAPROX DS) 550 MG tablet Take one tablet po BID x 5-7 days, then take it BID as needed 30 tablet 1   pantoprazole (PROTONIX) 40 MG tablet Take 1 tablet (40 mg total) by mouth daily. 90 tablet 0   simvastatin (ZOCOR) 10 MG tablet Take 1 tablet (10 mg total) by mouth daily. 90 tablet 3   verapamil (CALAN-SR) 120 MG CR tablet Take 1 tablet (120 mg  total) by mouth daily. 90 tablet 3   phentermine 15 MG capsule Take 1 capsule (15 mg total) by mouth every morning. (Patient not taking: Reported on 02/02/2024) 30 capsule 0   No current facility-administered medications on file prior to visit.    BP  110/88   Pulse 85   Temp 98.2 F (36.8 C) (Oral)   Ht 5\' 6"  (1.676 m)   Wt 221 lb (100.2 kg)   SpO2 97%   BMI 35.67 kg/m       Objective:   Physical Exam Vitals and nursing note reviewed.  Constitutional:      Appearance: Normal appearance. She is obese.  Cardiovascular:     Rate and Rhythm: Normal rate and regular rhythm.     Pulses: Normal pulses.     Heart sounds: Normal heart sounds.  Pulmonary:     Effort: Pulmonary effort is normal.     Breath sounds: Normal breath sounds.  Skin:    General: Skin is warm and dry.  Neurological:     General: No focal deficit present.     Mental Status: She is alert and oriented to person, place, and time.  Psychiatric:        Mood and Affect: Mood normal.        Behavior: Behavior normal.        Thought Content: Thought content normal.        Judgment: Judgment normal.        Assessment & Plan:  1. Gastroesophageal reflux disease with esophagitis without hemorrhage (Primary) - she can try stopping Protonix  2. Class 1 obesity - Since she is doing well on 15 mg, will keep her on this dose for another month - Follow up virtually in 30 days or sooner if any side effects develop  - phentermine 15 MG capsule; Take 1 capsule (15 mg total) by mouth every morning.  Dispense: 30 capsule; Refill: 0  3. BMI 36.0-36.9,adult  - phentermine 15 MG capsule; Take 1 capsule (15 mg total) by mouth every morning.  Dispense: 30 capsule; Refill: 0  Shirline Frees, NP

## 2024-02-19 ENCOUNTER — Ambulatory Visit (INDEPENDENT_AMBULATORY_CARE_PROVIDER_SITE_OTHER): Admitting: Obstetrics and Gynecology

## 2024-02-19 ENCOUNTER — Encounter: Payer: Self-pay | Admitting: Obstetrics and Gynecology

## 2024-02-19 VITALS — BP 142/88 | HR 80 | Resp 14 | Ht 66.0 in | Wt 216.0 lb

## 2024-02-19 DIAGNOSIS — T8332XD Displacement of intrauterine contraceptive device, subsequent encounter: Secondary | ICD-10-CM

## 2024-02-19 DIAGNOSIS — Z975 Presence of (intrauterine) contraceptive device: Secondary | ICD-10-CM | POA: Insufficient documentation

## 2024-02-19 DIAGNOSIS — Z01419 Encounter for gynecological examination (general) (routine) without abnormal findings: Secondary | ICD-10-CM | POA: Diagnosis not present

## 2024-02-19 DIAGNOSIS — N939 Abnormal uterine and vaginal bleeding, unspecified: Secondary | ICD-10-CM | POA: Insufficient documentation

## 2024-02-19 DIAGNOSIS — Z1331 Encounter for screening for depression: Secondary | ICD-10-CM | POA: Diagnosis not present

## 2024-02-19 DIAGNOSIS — T8332XA Displacement of intrauterine contraceptive device, initial encounter: Secondary | ICD-10-CM | POA: Insufficient documentation

## 2024-02-19 NOTE — Assessment & Plan Note (Addendum)
 Consistent spotting with Mirena  IUD after 6 years of use Known fibroids, July 2019 TVUS with 3 fibroids, the largest measuring 3.7 cm Recommend return to office for follow-up ultrasound and Mirena  IUD removal and reinsertion Patient in agreement with plan

## 2024-02-19 NOTE — Assessment & Plan Note (Signed)
 Cervical cancer screening performed according to ASCCP guidelines. Encouraged annual mammogram screening Labs and immunizations with her primary Encouraged safe sexual practices as indicated Encouraged healthy lifestyle practices with diet and exercise For patients under 42yo, I recommend 1000mg  calcium daily and 600IU of vitamin D daily.

## 2024-02-19 NOTE — Progress Notes (Signed)
 43 y.o. G19P1001 female with Mirena  IUD (inserted 04/12/2018) here for annual exam. Significant Other.  No LMP recorded. (Menstrual status: IUD). Period Pattern: (!) Irregular Menstrual Flow: Light, Moderate Dysmenorrhea: (!) Mild Dysmenorrhea Symptoms: Cramping  Today, she reports a change in her bleeding pattern over the past year.  She is now having spotting/light menstrual bleeding most days of the month.  It will stop for few days but come back.  It will occasionally happen after intercourse.  She has a strong FH of breast cancer on her Fathers side. She was seen by Genetics, not c/w hereditary. TC risk between 8.6-13% depending if PA had breast cancer.   H/O thyroid  nodule, on synthroid . She is followed by Endocrinology.   Abnormal bleeding: As noted above Pelvic discharge or pain: Mild cramping Breast mass, nipple discharge or skin changes : None Birth control: Mirena  Last PAP:     Component Value Date/Time   DIAGPAP  12/29/2021 1435    - Negative for intraepithelial lesion or malignancy (NILM)   DIAGPAP  11/02/2017 0000    NEGATIVE FOR INTRAEPITHELIAL LESIONS OR MALIGNANCY.   HPVHIGH Negative 12/29/2021 1435   ADEQPAP  12/29/2021 1435    Satisfactory for evaluation; transformation zone component PRESENT.   ADEQPAP  11/02/2017 0000    Satisfactory for evaluation  endocervical/transformation zone component PRESENT.   Last mammogram: 03/16/2023 BI-RADS 1 density B Last colonoscopy: Never Sexually active: Yes Exercising: Walking Pap Smoker: Never Gardasil: complete  Flowsheet Row Office Visit from 02/19/2024 in [facility]  PHQ-2 Total Score 0       Flowsheet Row Office Visit from 06/09/2023 in [facility] [place]  PHQ-9 Total Score 3       GYN HISTORY: No significant history  OB History  Gravida Para Term Preterm AB Living  1 1 1   1   SAB IAB Ectopic Multiple Live Births      1    # Outcome Date GA  Lbr Len/2nd Weight Sex Type Anes PTL Lv  1 Term      CS-Unspec   LIV    Past Medical History:  Diagnosis Date   Anemia    Arthritis    Knees   Back pain    Chest pain    Constipation    Dysmenorrhea    Family history of breast cancer    Family history of prostate cancer    Family history of throat cancer    GERD (gastroesophageal reflux disease)    Hypertension    Joint pain    Migraine without aura    Multiple food allergies    Milk   Palpitations     Past Surgical History:  Procedure Laterality Date   CESAREAN SECTION  01/11/2005    Current Outpatient Medications on File Prior to Visit  Medication Sig Dispense Refill   levonorgestrel  (MIRENA ) 20 MCG/24HR IUD 1 each by Intrauterine route once.     levothyroxine  (SYNTHROID ) 75 MCG tablet TAKE 1 TABLET(75 MCG) BY MOUTH DAILY 90 tablet 3   pantoprazole  (PROTONIX ) 40 MG tablet Take 1 tablet (40 mg total) by mouth daily. 90 tablet 0   phentermine  15 MG capsule Take 1 capsule (15 mg total) by mouth every morning. 30 capsule 0   simvastatin  (ZOCOR ) 10 MG tablet Take 1 tablet (10 mg total) by mouth daily. 90 tablet 3   verapamil  (CALAN -SR) 120 MG CR tablet Take 1 tablet (120 mg total) by mouth daily. 90 tablet  3   No current facility-administered medications on file prior to visit.    Social History   Socioeconomic History   Marital status: Significant Other    Spouse name: Scherry Curtis   Number of children: 1   Years of education: Not on file   Highest education level: Bachelor's degree (e.g., BA, AB, BS)  Occupational History   Occupation: Nurse, children's   Tobacco Use   Smoking status: Never   Smokeless tobacco: Never  Vaping Use   Vaping status: Never Used  Substance and Sexual Activity   Alcohol use: No   Drug use: No   Sexual activity: Yes    Partners: Male    Birth control/protection: Pill  Other Topics Concern   Not on file  Social History Narrative   She works at [employer].    Not married     1 child ( boy)   Social Drivers of Corporate investment banker Strain: Low Risk  (12/31/2023)   Overall Financial Resource Strain (CARDIA)    Difficulty of Paying Living Expenses: Not hard at all  Food Insecurity: No Food Insecurity (12/31/2023)   Hunger Vital Sign    Worried About Running Out of Food in the Last Year: Never true    Ran Out of Food in the Last Year: Never true  Transportation Needs: No Transportation Needs (12/31/2023)   PRAPARE - Administrator, Civil Service (Medical): No    Lack of Transportation (Non-Medical): No  Physical Activity: Sufficiently Active (12/31/2023)   Exercise Vital Sign    Days of Exercise per Week: 5 days    Minutes of Exercise per Session: 30 min  Stress: No Stress Concern Present (12/31/2023)   [employer] of Occupational Health - Occupational Stress Questionnaire    Feeling of Stress : Only a little  Social Connections: Moderately Integrated (12/31/2023)   Social Connection and Isolation Panel [NHANES]    Frequency of Communication with Friends and Family: More than three times a week    Frequency of Social Gatherings with Friends and Family: Once a week    Attends Religious Services: 1 to 4 times per year    Active Member of [employer] or Organizations: No    Attends Engineer, structural: Not on file    Marital Status: Living with partner  Intimate Partner Violence: Unknown (01/25/2022)   Received from [employer], [facility]   HITS    Physically Hurt: Not on file    Insult or Talk Down To: Not on file    Threaten Physical Harm: Not on file    Scream or Curse: Not on file    Family History  Problem Relation Age of Onset   Sudden Cardiac Death Mother        MI   Breast cancer Paternal Aunt        dx >50, unconfirmed diagnosis   Hypertension Maternal Grandmother    Diabetes Maternal Grandmother    Hypertension Maternal Aunt    Prostate cancer Maternal Uncle        dx 60s   Throat cancer Paternal Aunt        d.  >50, smoker   Breast cancer Paternal Great-grandmother        PGF's mother    Allergies  Allergen Reactions   Milk-Related Compounds Swelling      PE Today's Vitals   02/19/24 1528  BP: (!) 142/88  Pulse: 80  Resp: 14  Weight: 216 lb (98  kg)  Height: 5\' 6"  (1.676 m)   Body mass index is 34.86 kg/m.  Physical Exam Vitals reviewed. Exam conducted with a chaperone present.  Constitutional:      General: She is not in acute distress.    Appearance: Normal appearance.  HENT:     Head: Normocephalic and atraumatic.     Nose: Nose normal.  Eyes:     Extraocular Movements: Extraocular movements intact.     Conjunctiva/sclera: Conjunctivae normal.  Neck:     Thyroid : No thyroid  mass, thyromegaly or thyroid  tenderness.  Pulmonary:     Effort: Pulmonary effort is normal.  Chest:     Chest wall: No mass or tenderness.  Breasts:    Right: Normal. No swelling, mass, nipple discharge, skin change or tenderness.     Left: Normal. No swelling, mass, nipple discharge, skin change or tenderness.  Abdominal:     General: There is no distension.     Palpations: Abdomen is soft.     Tenderness: There is no abdominal tenderness.  Genitourinary:    General: Normal vulva.     Exam position: Lithotomy position.     Urethra: No prolapse.     Vagina: Normal. No vaginal discharge or bleeding.     Cervix: Normal. No lesion.     Uterus: Normal. Not enlarged and not tender.      Adnexa: Right adnexa normal and left adnexa normal.  Musculoskeletal:        General: Normal range of motion.     Cervical back: Normal range of motion.  Lymphadenopathy:     Upper Body:     Right upper body: No axillary adenopathy.     Left upper body: No axillary adenopathy.     Lower Body: No right inguinal adenopathy. No left inguinal adenopathy.  Skin:    General: Skin is warm and dry.  Neurological:     General: No focal deficit present.     Mental Status: She is alert.  Psychiatric:        Mood and  Affect: Mood normal.        Behavior: Behavior normal.       Assessment and Plan:        Well woman exam with routine gynecological exam Assessment & Plan: Cervical cancer screening performed according to ASCCP guidelines. Encouraged annual mammogram screening Labs and immunizations with her primary Encouraged safe sexual practices as indicated Encouraged healthy lifestyle practices with diet and exercise For patients under 50yo, I recommend 1000mg  calcium daily and 600IU of vitamin D  daily.    Abnormal uterine bleeding (AUB) Assessment & Plan: Consistent spotting with Mirena  IUD after 6 years of use Known fibroids, July 2019 TVUS with 3 fibroids, the largest measuring 3.7 cm Recommend return to office for follow-up ultrasound and Mirena  IUD removal and reinsertion Patient in agreement with plan  Orders: -     US  PELVIS TRANSVAGINAL NON-OB (TV ONLY); Future -     IUD Insertion; Future  Uses hormone releasing intrauterine device (IUD) for contraception  Intrauterine contraceptive device threads lost, subsequent encounter  Negative depression screening    Romaine Closs, MD

## 2024-02-19 NOTE — Patient Instructions (Signed)

## 2024-03-06 ENCOUNTER — Other Ambulatory Visit

## 2024-03-07 ENCOUNTER — Ambulatory Visit (INDEPENDENT_AMBULATORY_CARE_PROVIDER_SITE_OTHER): Admitting: Obstetrics and Gynecology

## 2024-03-07 ENCOUNTER — Encounter: Payer: Self-pay | Admitting: Obstetrics and Gynecology

## 2024-03-07 ENCOUNTER — Other Ambulatory Visit: Admitting: Obstetrics and Gynecology

## 2024-03-07 ENCOUNTER — Ambulatory Visit (INDEPENDENT_AMBULATORY_CARE_PROVIDER_SITE_OTHER)

## 2024-03-07 VITALS — BP 117/82 | HR 88 | Wt 215.0 lb

## 2024-03-07 DIAGNOSIS — D251 Intramural leiomyoma of uterus: Secondary | ICD-10-CM

## 2024-03-07 DIAGNOSIS — Z30433 Encounter for removal and reinsertion of intrauterine contraceptive device: Secondary | ICD-10-CM | POA: Diagnosis not present

## 2024-03-07 DIAGNOSIS — N939 Abnormal uterine and vaginal bleeding, unspecified: Secondary | ICD-10-CM

## 2024-03-07 DIAGNOSIS — T8332XD Displacement of intrauterine contraceptive device, subsequent encounter: Secondary | ICD-10-CM

## 2024-03-07 MED ORDER — LIDOCAINE HCL URETHRAL/MUCOSAL 2 % EX GEL: 1.0000 | Freq: Once | CUTANEOUS | Status: AC

## 2024-03-07 MED ORDER — LEVONORGESTREL 20 MCG/DAY IU IUD
1.0000 | INTRAUTERINE_SYSTEM | Freq: Once | INTRAUTERINE | Status: AC
  Administered 2024-03-07: 1 via INTRAUTERINE

## 2024-03-07 NOTE — Assessment & Plan Note (Signed)
 Consistent spotting with Mirena  IUD after 6 years of use Known fibroids, July 2019 TVUS with 3 fibroids, the largest measuring 3.7 cm Reviewed TVUS with patient today, unchanged on prior ultrasound. Recommend removal and reinsertion of Mirena  IUD given bleeding changes Patient in agreement Uncomplicated Mirena  IUD removal and reinsertion.   Return office in 6-week for string check.

## 2024-03-07 NOTE — Assessment & Plan Note (Signed)
 Intramural fibroid stable on ultrasound since 2019.  Largest fibroid measuring 3.7 cm. Contented with Mirena  IUD for AUB-F.

## 2024-03-07 NOTE — Patient Instructions (Signed)
 Intrauterine Device (IUD) Insertion: What to Expect  An intrauterine device (IUD) is put in (inserted) your uterus to prevent pregnancy. It's a small, T-shaped device that has one or two nylon strings hanging down from it. The strings hang out of your cervix, which is the lowest part of your uterus. Tell a health care provider about: Any allergies you have. All medicines you take. These include vitamins, herbs, eye drops, and creams. Any surgeries you have had. Any medical problems you have. Whether you're pregnant or may be pregnant. What are the risks? Your health care provider will talk with you about risks. These may include: Infection. Bleeding. Allergic reactions to medicines. A cut to the uterus, also called perforation, or damage to other structures or organs. Accidental placement of the IUD either in the muscle layer of the uterus or outside the uterus. The IUD falling out of the uterus. This is more common if you recently had a baby. Higher risk of ectopic pregnancy. This is when an egg is fertilized outside your uterus. This is rare. Pelvic inflammatory disease (PID). This is an infection in the uterus and fallopian tubes. The IUD doesn't cause the infection. The infection is usually from a sexually transmitted infection (STI). If this happens, it is usually during the first 20 days after the IUD is put in. This is rare. What happens before? Ask about changing or stopping: Any medicines you take. Any vitamins, herbs, or supplements you take. Your provider may tell you to take pain medicines you can buy at the store before the procedure. You may have tests for: Pregnancy. You may have a pee (urine) or blood sample taken. STIs. Placing an IUD can make an infection worse. To check for cervical cancer. You may have a Pap test, which is when cells from your cervix are removed for testing. What happens during an IUD insertion? A tool, called a speculum, will be placed in your  vagina and widened so that your provider can see your cervix. A medicine to clean your cervix may be used to help lower your risk of infection. You may be given medicine to numb your cervix. This medicine is usually given by an injection into your cervix. A tool will be put into your uterus to check the length of your uterus. A thin tube that holds the IUD will be put into your vagina, through the opening of your cervix, and into your uterus. The IUD will be placed in your uterus. The tube that holds the IUD will be removed. The strings that are attached to the IUD will be trimmed so that they sit just outside your cervix. The speculum will be removed. These steps may vary. Ask what you can expect. What happens after? You may have: Bleeding. It can vary from light bleeding or spotting for a few days to period-like bleeding. This is normal. Cramps and pain in your belly. Dizziness or light-headedness. Pain in your lower back. Headaches and the feeling like you may throw up Follow these instructions at home: Do not have sex or put anything into your vagina for 24 hours after the IUD is placed. Before having sex, check to make sure that you can feel the IUD string or strings. You should be able to feel the end of the string below the opening of your cervix. If your IUD string is in place, you may continue with sex. If you had a hormonal IUD put in more than 7 days after your most recent period  started, you will need to use a backup method of birth control for 7 days after the IUD was placed. Hormones are chemicals that affect how the body works. Check that the IUD is still in place by feeling for the strings after every period, or check once a month. Use a condom every time you have sex to prevent STIs. An IUD won't protect you from STIs. Take your medicines only as told. Contact a health care provider if: You have any of the following problems with your IUD string or strings: The string  bothers or hurts you or your sexual partner. You can't feel the string. The string has gotten longer. The IUD comes out or you can feel the IUD in your vagina. You think you may be pregnant, or you miss your period. You think you may have an STI. You have bad-smelling discharge from your vagina. You have a fever and chills. You have pain during sex. Get help right away if: You have heavy bleeding, which means soaking more than 2 pads per hour for 2 hours in a row. You have sudden, really bad belly pain. This information is not intended to replace advice given to you by your health care provider. Make sure you discuss any questions you have with your health care provider. Document Revised: 06/19/2023 Document Reviewed: 06/19/2023 Elsevier Patient Education  2024 [employer].

## 2024-03-07 NOTE — Progress Notes (Signed)
 43 y.o. G31P1001 female with fibroids (2019 US , largest 3.7cm), Mirena  IUD (inserted 04/12/2018), FH of breast cancer, H/o thyroid  nodule here for TVUS and IUD removal and reinsertion. Significant Other.  Patient's last menstrual period was 03/02/2024 (approximate). Period Pattern: (!) Irregular Menstrual Flow: Moderate Menstrual Control: Maxi pad Dysmenorrhea: None Dysmenorrhea Symptoms: Headache  At 02/19/2024 annual exam she reports a change in her bleeding pattern over the past year.  She is now having spotting/light menstrual bleeding most days of the month.  It will stop for few days but come back.  It will occasionally happen after intercourse.  Today she still having spotting.  Flowsheet Row Office Visit from 02/19/2024 in [facility]  PHQ-2 Total Score 0       Flowsheet Row Office Visit from 06/09/2023 in [facility] [place]  PHQ-9 Total Score 3       GYN HISTORY: No significant history  OB History  Gravida Para Term Preterm AB Living  1 1 1   1   SAB IAB Ectopic Multiple Live Births      1    # Outcome Date GA Lbr Len/2nd Weight Sex Type Anes PTL Lv  1 Term      CS-Unspec   LIV    Past Medical History:  Diagnosis Date   Anemia    Arthritis    Knees   Back pain    Chest pain    Constipation    Dysmenorrhea    Family history of breast cancer    Family history of prostate cancer    Family history of throat cancer    GERD (gastroesophageal reflux disease)    Hypertension    Joint pain    Migraine without aura    Multiple food allergies    Milk   Palpitations     Past Surgical History:  Procedure Laterality Date   CESAREAN SECTION  01/11/2005    Current Outpatient Medications on File Prior to Visit  Medication Sig Dispense Refill   levothyroxine  (SYNTHROID ) 75 MCG tablet TAKE 1 TABLET(75 MCG) BY MOUTH DAILY 90 tablet 3   pantoprazole  (PROTONIX ) 40 MG tablet Take 1 tablet (40 mg total) by mouth  daily. 90 tablet 0   phentermine  15 MG capsule Take 1 capsule (15 mg total) by mouth every morning. 30 capsule 0   simvastatin  (ZOCOR ) 10 MG tablet Take 1 tablet (10 mg total) by mouth daily. 90 tablet 3   verapamil  (CALAN -SR) 120 MG CR tablet Take 1 tablet (120 mg total) by mouth daily. 90 tablet 3   No current facility-administered medications on file prior to visit.    Social History   Socioeconomic History   Marital status: Significant Other    Spouse name: Scherry Curtis   Number of children: 1   Years of education: Not on file   Highest education level: Bachelor's degree (e.g., BA, AB, BS)  Occupational History   Occupation: Nurse, children's   Tobacco Use   Smoking status: Never   Smokeless tobacco: Never  Vaping Use   Vaping status: Never Used  Substance and Sexual Activity   Alcohol use: No   Drug use: No   Sexual activity: Yes    Partners: Male    Birth control/protection: Pill  Other Topics Concern   Not on file  Social History Narrative   She works at [employer].    Not married    1 child ( boy)   Social Drivers  of Health   Financial Resource Strain: Low Risk  (12/31/2023)   Overall Financial Resource Strain (CARDIA)    Difficulty of Paying Living Expenses: Not hard at all  Food Insecurity: No Food Insecurity (12/31/2023)   Hunger Vital Sign    Worried About Running Out of Food in the Last Year: Never true    Ran Out of Food in the Last Year: Never true  Transportation Needs: No Transportation Needs (12/31/2023)   PRAPARE - Administrator, Civil Service (Medical): No    Lack of Transportation (Non-Medical): No  Physical Activity: Sufficiently Active (12/31/2023)   Exercise Vital Sign    Days of Exercise per Week: 5 days    Minutes of Exercise per Session: 30 min  Stress: No Stress Concern Present (12/31/2023)   [employer] of Occupational Health - Occupational Stress Questionnaire    Feeling of Stress : Only a little  Social Connections:  Moderately Integrated (12/31/2023)   Social Connection and Isolation Panel [NHANES]    Frequency of Communication with Friends and Family: More than three times a week    Frequency of Social Gatherings with Friends and Family: Once a week    Attends Religious Services: 1 to 4 times per year    Active Member of [employer] or Organizations: No    Attends Engineer, structural: Not on file    Marital Status: Living with partner  Intimate Partner Violence: Unknown (01/25/2022)   Received from [employer], [facility]   HITS    Physically Hurt: Not on file    Insult or Talk Down To: Not on file    Threaten Physical Harm: Not on file    Scream or Curse: Not on file    Family History  Problem Relation Age of Onset   Sudden Cardiac Death Mother        MI   Breast cancer Paternal Aunt        dx >50, unconfirmed diagnosis   Hypertension Maternal Grandmother    Diabetes Maternal Grandmother    Hypertension Maternal Aunt    Prostate cancer Maternal Uncle        dx 60s   Throat cancer Paternal Aunt        d. >50, smoker   Breast cancer Paternal Great-grandmother        PGF's mother    Allergies  Allergen Reactions   Milk-Related Compounds Swelling      PE Today's Vitals   03/07/24 1527  BP: 117/82  Pulse: 88  SpO2: 98%  Weight: 215 lb (97.5 kg)   Body mass index is 34.7 kg/m.  Physical Exam Vitals reviewed. Exam conducted with a chaperone present.  Constitutional:      General: She is not in acute distress.    Appearance: Normal appearance.  HENT:     Head: Normocephalic and atraumatic.     Nose: Nose normal.  Eyes:     Extraocular Movements: Extraocular movements intact.     Conjunctiva/sclera: Conjunctivae normal.  Pulmonary:     Effort: Pulmonary effort is normal.  Genitourinary:    General: Normal vulva.     Exam position: Lithotomy position.     Vagina: Normal. No vaginal discharge.     Cervix: Normal. No cervical motion tenderness, discharge or lesion.      Uterus: Normal. Not enlarged and not tender.      Adnexa: Right adnexa normal and left adnexa normal.     Comments: IUD strings not visualized Musculoskeletal:  General: Normal range of motion.     Cervical back: Normal range of motion.  Neurological:     General: No focal deficit present.     Mental Status: She is alert.  Psychiatric:        Mood and Affect: Mood normal.        Behavior: Behavior normal.     03/07/24 TVUS: Indications: AUB  Findings:   Uterus: 8.9 x 6.5 x 6.5 cm, retroverted.  2 intramural fibroids noted: (1)-3.7 x 3.4 cm and (2) 2.2 x 1.8 cm.  IUD noted within endometrial cavity. Endometrial thickness: 4 mm. Left ovary: 4.3 x 3.0 x 2.8 cm, 23 mm follicle present. Right ovary: 4.1 x 2.2 x 2.5 cm, normal-appearing. No free fluid.  Impression:  Stable uterine fibroids from 2019 TVUS. 2.  IUD present.    Romaine Closs, MD  Procedure:   Consent was signed. Timeout was performed. Speculum inserted.  2% glydo applied to cervix (Lot #[id number], expiration 20 26-10). Speculum removed, and patient given at least 10 mins prior to proceeding with procedure. Speculum was then re-inserted.  Cervix visualized and cleansed with Betadine x 3.  A thin forceps was used to grasp the IUD strings within the cervical canal.  The Mirena  IUD was removed with gentle traction.  A tenaculum placed on the posterior cervix. Then uterus sounded to 8 cm.  Mirena  IUD (Lot#: [id number], Exp 2027/April) was placed according to manufacturer's guidelines. Strings trimmed to 2 cm.  Minimal bleeding noted.  Pt tolerated the procedure well.   Assessment and Plan:        Abnormal uterine bleeding (AUB) Assessment & Plan: Consistent spotting with Mirena  IUD after 6 years of use Known fibroids, July 2019 TVUS with 3 fibroids, the largest measuring 3.7 cm Reviewed TVUS with patient today, unchanged on prior ultrasound. Recommend removal and reinsertion of Mirena  IUD given bleeding  changes Patient in agreement Uncomplicated Mirena  IUD removal and reinsertion.   Return office in 6-week for string check.   Intramural leiomyoma of uterus Assessment & Plan: Intramural fibroid stable on ultrasound since 2019.  Largest fibroid measuring 3.7 cm. Contented with Mirena  IUD for AUB-F.   Encounter for IUD removal and reinsertion -     lidocaine -     Levonorgestrel   Intrauterine contraceptive device threads lost, subsequent encounter  Resolved   Romaine Closs, MD

## 2024-03-15 ENCOUNTER — Telehealth: Admitting: Adult Health

## 2024-03-15 VITALS — Ht 66.0 in | Wt 213.0 lb

## 2024-03-15 DIAGNOSIS — Z6836 Body mass index (BMI) 36.0-36.9, adult: Secondary | ICD-10-CM | POA: Diagnosis not present

## 2024-03-15 DIAGNOSIS — K2100 Gastro-esophageal reflux disease with esophagitis, without bleeding: Secondary | ICD-10-CM | POA: Diagnosis not present

## 2024-03-15 DIAGNOSIS — E66811 Obesity, class 1: Secondary | ICD-10-CM

## 2024-03-15 MED ORDER — PHENTERMINE HCL 30 MG PO CAPS: 30.0000 mg | ORAL_CAPSULE | ORAL | 2 refills | Status: DC

## 2024-03-15 NOTE — Progress Notes (Signed)
 Virtual Visit via Video Note  I connected with Audrey Stafford  on 03/15/24 at  7:30 AM EDT by a video enabled telemedicine application and verified that I am speaking with the correct person using two identifiers.  Location patient: home Location provider:work or home office Persons participating in the virtual visit: patient, provider  I discussed the limitations of evaluation and management by telemedicine and the availability of in person appointments. The patient expressed understanding and agreed to proceed.   HPI:  She is being evaluated today for follow up regarding weight loss management. Over the years she has had a hard time losing weight, she has been seen by weight loss clinic in the past, tried various diets as well as  fasting with minimal results.  Her insurance last year did not cover Wegovy  or Zepbound.  She was interested in trying phentermine , so we started her on phentermine  15 mg daily about two months ago. 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 Since starting Phentermine  she has been able to lose about 16 pounds. She no longer is thinking about food or having cravings but she has noticed that her appetite is not as suppressed as when she first started. . She has started eating healthier, tracking her calories in an app and walking. She feels much better overall. She denies side effects.   Wt Readings from Last 5 Encounters:  03/15/24 213 lb (96.6 kg)  03/07/24 215 lb (97.5 kg)  02/19/24 216 lb (98 kg)  02/02/24 221 lb (100.2 kg)  01/26/24 224 lb (101.6 kg)   Gerd - her GERD symptoms have not returned, she is no loner taking Protonix    ROS: See pertinent positives and negatives per HPI.  Past Medical History:  Diagnosis Date   Anemia    Arthritis    Knees   Back pain    Chest pain    Constipation    Dysmenorrhea    Family history of breast cancer    Family history of prostate cancer    Family history of throat cancer     GERD (gastroesophageal reflux disease)    Hypertension    Joint pain    Migraine without aura    Multiple food allergies    Milk   Palpitations     Past Surgical History:  Procedure Laterality Date   CESAREAN SECTION  01/11/2005    Family History  Problem Relation Age of Onset   Sudden Cardiac Death Mother        MI   Breast cancer Paternal Aunt        dx >50, unconfirmed diagnosis   Hypertension Maternal Grandmother    Diabetes Maternal Grandmother    Hypertension Maternal Aunt    Prostate cancer Maternal Uncle        dx 50s   Throat cancer Paternal Aunt        d. >50, smoker   Breast cancer Paternal Great-grandmother        PGF's mother       Current Outpatient Medications:    levonorgestrel  (MIRENA ) 20 MCG/DAY IUD, 1 each by Intrauterine route once., Disp: , Rfl:    levothyroxine  (SYNTHROID ) 75 MCG tablet, TAKE 1 TABLET(75 MCG) BY MOUTH DAILY, Disp: 90 tablet, Rfl: 3   pantoprazole  (PROTONIX ) 40 MG tablet, Take 1 tablet (40 mg total) by mouth daily., Disp: 90 tablet, Rfl: 0   phentermine  15 MG capsule, Take 1 capsule (15 mg total) by mouth every  morning., Disp: 30 capsule, Rfl: 0   simvastatin  (ZOCOR ) 10 MG tablet, Take 1 tablet (10 mg total) by mouth daily., Disp: 90 tablet, Rfl: 3   verapamil  (CALAN -SR) 120 MG CR tablet, Take 1 tablet (120 mg total) by mouth daily., Disp: 90 tablet, Rfl: 3  Current Facility-Administered Medications:    lidocaine  (XYLOCAINE ) 2 % jelly 1 Application, 1 Application, Topical, Once,   EXAM:  VITALS per patient if applicable:  GENERAL: alert, oriented, appears well and in no acute distress  HEENT: atraumatic, conjunttiva clear, no obvious abnormalities on inspection of external nose and ears  NECK: normal movements of the head and neck  LUNGS: on inspection no signs of respiratory distress, breathing rate appears normal, no obvious gross SOB, gasping or wheezing  CV: no obvious cyanosis  MS: moves all visible extremities  without noticeable abnormality  PSYCH/NEURO: pleasant and cooperative, no obvious depression or anxiety, speech and thought processing grossly intact  ASSESSMENT AND PLAN:  Discussed the following assessment and plan:  1. Gastroesophageal reflux disease with esophagitis without hemorrhage (Primary) - Resolved  2. Class 1 obesity - Will increase her dose to 30 mg daily.  - Follow up in 3 months  - Continue to eat healthy and exercise - phentermine  30 MG capsule; Take 1 capsule (30 mg total) by mouth every morning.  Dispense: 30 capsule; Refill: 2  3. BMI 36.0-36.9,adult  - phentermine  30 MG capsule; Take 1 capsule (30 mg total) by mouth every morning.  Dispense: 30 capsule; Refill: 2      I discussed the assessment and treatment plan with the patient. The patient was provided an opportunity to ask questions and all were answered. The patient agreed with the plan and demonstrated an understanding of the instructions.   The patient was advised to call back or seek an in-person evaluation if the symptoms worsen or if the condition fails to improve as anticipated.   [name], NP

## 2024-03-29 ENCOUNTER — Encounter: Payer: Self-pay | Admitting: Adult Health

## 2024-03-29 ENCOUNTER — Telehealth: Admitting: Adult Health

## 2024-03-29 VITALS — Ht 66.0 in | Wt 213.0 lb

## 2024-03-29 DIAGNOSIS — Z889 Allergy status to unspecified drugs, medicaments and biological substances status: Secondary | ICD-10-CM | POA: Diagnosis not present

## 2024-03-29 DIAGNOSIS — N3090 Cystitis, unspecified without hematuria: Secondary | ICD-10-CM

## 2024-03-29 MED ORDER — AMOXICILLIN-POT CLAVULANATE 875-125 MG PO TABS: 1.0000 | ORAL_TABLET | Freq: Two times a day (BID) | ORAL | 0 refills | Status: AC

## 2024-03-29 NOTE — Progress Notes (Signed)
 Virtual Visit via Video Note  I connected with Audrey Stafford on 03/29/24 at  9:30 AM EDT by a video enabled telemedicine application and verified that I am speaking with the correct person using two identifiers.  Location patient: home Location provider:work or home office Persons participating in the virtual visit: patient, provider  I discussed the limitations of evaluation and management by telemedicine and the availability of in person appointments. The patient expressed understanding and agreed to proceed.   HPI: She is being evaluated today for concern of an allergic reaction and UTI.  About 4 to 5 days ago she developed urinary urgency and decreased urination.  She did not have any dysuria or hematuria.  She could not get an appointment at this office and used a Tele Doc service through her job.  She was diagnosed with cystitis and prescribed nitrofurantoin.  After the first dose she started to feel like her throat was irritated and she had to clear her throat continuously and the tip of her tongue turned red.  She took a Benadryl  helped her symptoms and she has not taken the medication since.  Her throat no longer feels irritated and she does not feel as though she needs to clear her throat her tongue is also improving.  She does report that she is no longer experiencing frequent urination but that it "feels funny down there".  She does not really have any burning pain.   ROS: See pertinent positives and negatives per HPI.  Past Medical History:  Diagnosis Date   Anemia    Arthritis    Knees   Back pain    Chest pain    Constipation    Dysmenorrhea    Family history of breast cancer    Family history of prostate cancer    Family history of throat cancer    GERD (gastroesophageal reflux disease)    Hypertension    Joint pain    Migraine without aura    Multiple food allergies    Milk   Palpitations     Past Surgical History:  Procedure Laterality Date   CESAREAN  SECTION  01/11/2005    Family History  Problem Relation Age of Onset   Sudden Cardiac Death Mother        MI   Breast cancer Paternal Aunt        dx >50, unconfirmed diagnosis   Hypertension Maternal Grandmother    Diabetes Maternal Grandmother    Hypertension Maternal Aunt    Prostate cancer Maternal Uncle        dx 30s   Throat cancer Paternal Aunt        d. >50, smoker   Breast cancer Paternal Great-grandmother        PGF's mother       Current Outpatient Medications:    levonorgestrel  (MIRENA ) 20 MCG/DAY IUD, 1 each by Intrauterine route once., Disp: , Rfl:    levothyroxine  (SYNTHROID ) 75 MCG tablet, TAKE 1 TABLET(75 MCG) BY MOUTH DAILY, Disp: 90 tablet, Rfl: 3   nitrofurantoin, macrocrystal-monohydrate, (MACROBID) 100 MG capsule, Take 100 mg by mouth 2 (two) times daily., Disp: , Rfl:    pantoprazole  (PROTONIX ) 40 MG tablet, Take 1 tablet (40 mg total) by mouth daily., Disp: 90 tablet, Rfl: 0   phentermine  30 MG capsule, Take 1 capsule (30 mg total) by mouth every morning., Disp: 30 capsule, Rfl: 2   simvastatin  (ZOCOR ) 10 MG tablet, Take 1 tablet (10 mg total) by mouth daily., Disp:  90 tablet, Rfl: 3   verapamil  (CALAN -SR) 120 MG CR tablet, Take 1 tablet (120 mg total) by mouth daily., Disp: 90 tablet, Rfl: 3  Current Facility-Administered Medications:    lidocaine  (XYLOCAINE ) 2 % jelly 1 Application, 1 Application, Topical, Once,   EXAM:  VITALS per patient if applicable:  GENERAL: alert, oriented, appears well and in no acute distress  HEENT: atraumatic, conjunttiva clear, no obvious abnormalities on inspection of external nose and ears  NECK: normal movements of the head and neck  LUNGS: on inspection no signs of respiratory distress, breathing rate appears normal, no obvious gross SOB, gasping or wheezing  CV: no obvious cyanosis  MS: moves all visible extremities without noticeable abnormality  PSYCH/NEURO: pleasant and cooperative, no obvious depression  or anxiety, speech and thought processing grossly intact  ASSESSMENT AND PLAN:  Discussed the following assessment and plan:  1. Cystitis (Primary) - Will treat her with a 5-day course of Augmentin for suspected cystitis.  Eyes follow-up next week if symptoms continue. - amoxicillin-clavulanate (AUGMENTIN) 875-125 MG tablet; Take 1 tablet by mouth 2 (two) times daily for 5 days.  Dispense: 10 tablet; Refill: 0  2. Drug allergy - Macrobid added to her drug allergy profile   Alto Atta, NP      I discussed the assessment and treatment plan with the patient. The patient was provided an opportunity to ask questions and all were answered. The patient agreed with the plan and demonstrated an understanding of the instructions.   The patient was advised to call back or seek an in-person evaluation if the symptoms worsen or if the condition fails to improve as anticipated.   Carlyn Mullenbach, NP

## 2024-04-01 ENCOUNTER — Other Ambulatory Visit: Payer: Self-pay | Admitting: Obstetrics and Gynecology

## 2024-04-01 DIAGNOSIS — Z1231 Encounter for screening mammogram for malignant neoplasm of breast: Secondary | ICD-10-CM

## 2024-04-03 ENCOUNTER — Ambulatory Visit
Admission: RE | Admit: 2024-04-03 | Discharge: 2024-04-03 | Discharge status: Home / Self Care | Source: Ambulatory Visit

## 2024-04-03 DIAGNOSIS — Z1231 Encounter for screening mammogram for malignant neoplasm of breast: Secondary | ICD-10-CM

## 2024-04-08 ENCOUNTER — Ambulatory Visit: Payer: Self-pay | Admitting: Obstetrics and Gynecology

## 2024-04-10 ENCOUNTER — Ambulatory Visit: Admitting: Obstetrics and Gynecology

## 2024-04-18 ENCOUNTER — Ambulatory Visit: Admitting: Obstetrics and Gynecology

## 2024-04-18 ENCOUNTER — Encounter: Payer: Self-pay | Admitting: Obstetrics and Gynecology

## 2024-04-18 VITALS — BP 138/82 | HR 82 | Temp 98.4°F | Wt 208.0 lb

## 2024-04-18 DIAGNOSIS — Z975 Presence of (intrauterine) contraceptive device: Secondary | ICD-10-CM

## 2024-04-18 NOTE — Progress Notes (Signed)
 43 y.o. G52P1001 female with AUB-fibroids (2019 US , largest 3.7cm), Mirena  IUD (inserted 03/07/2024), FH of breast cancer, H/o thyroid  nodule  here for IUD string check. Significant Other.  Patient's last menstrual period was 04/09/2024 (approximate). Period Duration (Days): 5 Menstrual Flow: Light Menstrual Control: Maxi pad Dysmenorrhea: None Dysmenorrhea Symptoms:  (breast tenderness)  At 02/19/2024 annual exam she reports a change in her bleeding pattern over the past year.  She is now having spotting/light menstrual bleeding most days of the month.  It will stop for few days but come back.  It will occasionally happen after intercourse.   03/07/2024 TVUS unchanged from prior She reports doing well since IUD insertion.  Normal cycle last week.  No other spotting.  Minimal cramping.  Birth control: IUD Last mammogram: 04/03/24 BI-RADS 1 Sexually active: Yes   GYN HISTORY: No significant history  OB History  Gravida Para Term Preterm AB Living  1 1 1   1   SAB IAB Ectopic Multiple Live Births      1    # Outcome Date GA Lbr Len/2nd Weight Sex Type Anes PTL Lv  1 Term      CS-Unspec   LIV   Past Medical History:  Diagnosis Date   Anemia    Arthritis    Knees   Back pain    Chest pain    Constipation    Dysmenorrhea    Family history of breast cancer    Family history of prostate cancer    Family history of throat cancer    GERD (gastroesophageal reflux disease)    Hypertension    Joint pain    Migraine without aura    Multiple food allergies    Milk   Palpitations    Past Surgical History:  Procedure Laterality Date   CESAREAN SECTION  01/11/2005   Current Outpatient Medications on File Prior to Visit  Medication Sig Dispense Refill   levonorgestrel  (MIRENA ) 20 MCG/DAY IUD 1 each by Intrauterine route once.     levothyroxine  (SYNTHROID ) 75 MCG tablet TAKE 1 TABLET(75 MCG) BY MOUTH DAILY 90 tablet 3   pantoprazole  (PROTONIX ) 40 MG tablet Take 1 tablet (40 mg  total) by mouth daily. 90 tablet 0   phentermine  30 MG capsule Take 1 capsule (30 mg total) by mouth every morning. 30 capsule 2   simvastatin  (ZOCOR ) 10 MG tablet Take 1 tablet (10 mg total) by mouth daily. 90 tablet 3   verapamil  (CALAN -SR) 120 MG CR tablet Take 1 tablet (120 mg total) by mouth daily. 90 tablet 3   Current Facility-Administered Medications on File Prior to Visit  Medication Dose Route Frequency Provider Last Rate Last Admin   lidocaine  (XYLOCAINE ) 2 % jelly 1 Application  1 Application Topical Once        Allergies  Allergen Reactions   Macrobid [Nitrofurantoin] Other (See Comments)    Throat irritation    Milk-Related Compounds Swelling      PE Today's Vitals   04/18/24 1449  BP: 138/82  Pulse: 82  Temp: 98.4 F (36.9 C)  TempSrc: Oral  SpO2: 98%  Weight: 208 lb (94.3 kg)   Body mass index is 33.57 kg/m.  Physical Exam Vitals reviewed. Exam conducted with a chaperone present.  Constitutional:      General: She is not in acute distress.    Appearance: Normal appearance.  HENT:     Head: Normocephalic and atraumatic.     Nose: Nose normal.   Eyes:  Extraocular Movements: Extraocular movements intact.     Conjunctiva/sclera: Conjunctivae normal.   Pulmonary:     Effort: Pulmonary effort is normal.  Genitourinary:    General: Normal vulva.     Exam position: Lithotomy position.     Vagina: Normal. No vaginal discharge.     Cervix: Normal. No cervical motion tenderness, discharge or lesion.     Uterus: Normal. Not enlarged and not tender.      Adnexa: Right adnexa normal and left adnexa normal.     Comments: IUD strings present.  Musculoskeletal:        General: Normal range of motion.     Cervical back: Normal range of motion.   Neurological:     General: No focal deficit present.     Mental Status: She is alert.   Psychiatric:        Mood and Affect: Mood normal.        Behavior: Behavior normal.      Assessment and Plan:         Uses hormone releasing intrauterine device (IUD) for contraception  Normal string check.  Vera LULLA Pa, MD

## 2024-05-03 ENCOUNTER — Ambulatory Visit (HOSPITAL_COMMUNITY)
Admission: EM | Admit: 2024-05-03 | Discharge: 2024-05-03 | Disposition: A | Discharge status: Home / Self Care | Attending: Emergency Medicine | Admitting: Emergency Medicine

## 2024-05-03 ENCOUNTER — Encounter (HOSPITAL_COMMUNITY): Payer: Self-pay

## 2024-05-03 DIAGNOSIS — J029 Acute pharyngitis, unspecified: Secondary | ICD-10-CM | POA: Insufficient documentation

## 2024-05-03 LAB — POCT RAPID STREP A (OFFICE): Rapid Strep A Screen: NEGATIVE

## 2024-05-03 MED ORDER — PREDNISONE 20 MG PO TABS
40.0000 mg | ORAL_TABLET | Freq: Once | ORAL | Status: AC
  Administered 2024-05-03: 40 mg via ORAL

## 2024-05-03 MED ORDER — AMOXICILLIN 500 MG PO CAPS
500.0000 mg | ORAL_CAPSULE | Freq: Two times a day (BID) | ORAL | 0 refills | Status: AC
Start: 2024-05-03 — End: 2024-05-13

## 2024-05-03 MED ORDER — PREDNISONE 20 MG PO TABS
ORAL_TABLET | ORAL | Status: AC
  Filled 2024-05-03: qty 2

## 2024-05-03 NOTE — Discharge Instructions (Signed)
 Take the amoxicillin  twice daily with food for the next 10 days.  Continue all antibiotics until finished unless advised to stop.  We have given you a one-time dose of steroids in clinic to help with pain and swelling.  You can alternate between 500 mg of Tylenol and 800 mg of ibuprofen for any further pain or discomfort.  Warm saline gargles, tea with honey and popsicles can further help soothe the sore throat.  Change out your toothbrush in the next 24 hours.  Once you are on antibiotics for 24 hours you are no longer considered contagious.  Return to clinic if no improvement or any changes.

## 2024-05-03 NOTE — ED Provider Notes (Signed)
 [facility]    CSN: [id number] Arrival date & time: 05/03/24  0807      History   Chief Complaint Chief Complaint  Patient presents with   Sore Throat    HPI Audrey Stafford is a 43 y.o. female.   Patient presents to clinic over concern of sore throat, and 'feeling like my throat is on fire' for the past 3 days. Has been having pain with swallowing. Tried tea, cough drops, Nyquil and alka seltzer without much improvement. Does not think she has had any fevers. Noticed tonsil swelling with bilateral white patches earlier today so she decided to come into clinic. Denies recent sick contacts. Without cough, congestion or post nasal drip.   The history is provided by the patient and medical records.  Sore Throat    Past Medical History:  Diagnosis Date   Anemia    Arthritis    Knees   Back pain    Chest pain    Constipation    Dysmenorrhea    Family history of breast cancer    Family history of prostate cancer    Family history of throat cancer    GERD (gastroesophageal reflux disease)    Hypertension    Joint pain    Migraine without aura    Multiple food allergies    Milk   Palpitations     Patient Active Problem List   Diagnosis Date Noted   Intramural leiomyoma of uterus 03/07/2024   Well woman exam with routine gynecological exam 02/19/2024   Uses hormone releasing intrauterine device (IUD) for contraception 02/19/2024   Abnormal uterine bleeding (AUB) 02/19/2024   Family history of premature CAD 09/10/2021   Chest pain of uncertain etiology 09/10/2021   PVC (premature ventricular contraction) 09/10/2021   Family history of breast cancer    Family history of throat cancer    Family history of prostate cancer    Migraine without aura and without status migrainosus, not intractable 12/23/2019   Vitamin D  deficiency 04/09/2019   Class 1 obesity with serious comorbidity and body mass index (BMI) of 30.0 to 30.9 in adult 02/12/2019    Hyperlipidemia 03/11/2013    Past Surgical History:  Procedure Laterality Date   CESAREAN SECTION  01/11/2005    OB History     Gravida  1   Para  1   Term  1   Preterm      AB      Living  1      SAB      IAB      Ectopic      Multiple      Live Births  1            Home Medications    Prior to Admission medications   Medication Sig Start Date End Date Taking? Authorizing Provider  amoxicillin  (AMOXIL ) 500 MG capsule Take 1 capsule (500 mg total) by mouth 2 (two) times daily for 10 days. 05/03/24 05/13/24 Yes Thaddus Mcdowell  N, FNP  levonorgestrel  (MIRENA ) 20 MCG/DAY IUD 1 each by Intrauterine route once.   Yes [provider]  levothyroxine  (SYNTHROID ) 75 MCG tablet TAKE 1 TABLET(75 MCG) BY MOUTH DAILY 01/29/24  Yes Trixie File, MD  pantoprazole  (PROTONIX ) 40 MG tablet Take 1 tablet (40 mg total) by mouth daily. 01/03/24  Yes Nafziger, Darleene, NP  phentermine  30 MG capsule Take 1 capsule (30 mg total) by mouth every morning. 03/15/24  Yes Nafziger, Darleene, NP  simvastatin  (  ZOCOR ) 10 MG tablet Take 1 tablet (10 mg total) by mouth daily. 06/09/23  Yes Nafziger, Darleene, NP  verapamil  (CALAN -SR) 120 MG CR tablet Take 1 tablet (120 mg total) by mouth daily. 08/28/23  Yes Lonni Slain, MD    Family History Family History  Problem Relation Age of Onset   Sudden Cardiac Death Mother        MI   Breast cancer Paternal Aunt        dx >50, unconfirmed diagnosis   Hypertension Maternal Grandmother    Diabetes Maternal Grandmother    Hypertension Maternal Aunt    Prostate cancer Maternal Uncle        dx 62s   Throat cancer Paternal Aunt        d. >50, smoker   Breast cancer Paternal Great-grandmother        PGF's mother    Social History Social History   Tobacco Use   Smoking status: Never   Smokeless tobacco: Never  Vaping Use   Vaping status: Never Used  Substance Use Topics   Alcohol use: No   Drug use: No     Allergies    Macrobid [nitrofurantoin] and Milk-related compounds   Review of Systems Review of Systems  Per HPI  Physical Exam Triage Vital Signs ED Triage Vitals  Encounter Vitals Group     BP 05/03/24 0847 (!) 151/100     Girls Systolic BP Percentile --      Girls Diastolic BP Percentile --      Boys Systolic BP Percentile --      Boys Diastolic BP Percentile --      Pulse Rate 05/03/24 0847 89     Resp 05/03/24 0847 18     Temp 05/03/24 0847 98.5 F (36.9 C)     Temp Source 05/03/24 0847 Oral     SpO2 05/03/24 0847 95 %     Weight 05/03/24 0847 206 lb (93.4 kg)     Height 05/03/24 0847 5' 6 (1.676 m)     Head Circumference --      Peak Flow --      Pain Score 05/03/24 0845 10     Pain Loc --      Pain Education --      Exclude from Growth Chart --    No data found.  Updated Vital Signs BP (!) 151/100 (BP Location: Left Arm)   Pulse 89   Temp 98.5 F (36.9 C) (Oral)   Resp 18   Ht 5' 6 (1.676 m)   Wt 206 lb (93.4 kg)   LMP 04/25/2024 (Approximate)   SpO2 95%   BMI 33.25 kg/m   Visual Acuity Right Eye Distance:   Left Eye Distance:   Bilateral Distance:    Right Eye Near:   Left Eye Near:    Bilateral Near:     Physical Exam Vitals and nursing note reviewed.  Constitutional:      Appearance: She is well-developed.  HENT:     Head: Normocephalic and atraumatic.     Nose: No congestion or rhinorrhea.     Mouth/Throat:     Mouth: Mucous membranes are moist.     Pharynx: Uvula midline. Posterior oropharyngeal erythema present.     Tonsils: Tonsillar exudate present. No tonsillar abscesses. 2+ on the right. 2+ on the left.  Cardiovascular:     Rate and Rhythm: Normal rate.  Pulmonary:     Effort: Pulmonary effort is normal. No respiratory distress.  Skin:    General: Skin is warm and dry.  Neurological:     General: No focal deficit present.     Mental Status: She is alert and oriented to person, place, and time.  Psychiatric:        Mood and Affect:  Mood normal.        Behavior: Behavior normal.      UC Treatments / Results  Labs (all labs ordered are listed, but only abnormal results are displayed) Labs Reviewed  POCT RAPID STREP A (OFFICE) - Normal  CULTURE, GROUP A STREP [facility])    EKG   Radiology No results found.  Procedures Procedures (including critical care time)  Medications Ordered in UC Medications  predniSONE  (DELTASONE ) tablet 40 mg (has no administration in time range)    Initial Impression / Assessment and Plan / UC Course  I have reviewed the triage vital signs and the nursing notes.  Pertinent labs & imaging results that were available during my care of the patient were reviewed by me and considered in my medical decision making (see chart for details).  Vitals in triage reviewed, patient is hemodynamically stable.  Tonsils with bilateral exudate and 2+ edema, uvula midline. Negative for voice change. Low concern for PTA. POC rapid strep negative, will send for culture.  Due to presentation will place on amoxicillin  for bacterial ideology.  Oral steroids given to help with the discomfort and swelling.  Plan of care, follow-up care return precautions given, no questions at this time.     Final Clinical Impressions(s) / UC Diagnoses   Final diagnoses:  Pharyngitis, unspecified etiology     Discharge Instructions      Take the amoxicillin  twice daily with food for the next 10 days.  Continue all antibiotics until finished unless advised to stop.  We have given you a one-time dose of steroids in clinic to help with pain and swelling.  You can alternate between 500 mg of Tylenol and 800 mg of ibuprofen for any further pain or discomfort.  Warm saline gargles, tea with honey and popsicles can further help soothe the sore throat.  Change out your toothbrush in the next 24 hours.  Once you are on antibiotics for 24 hours you are no longer considered contagious.  Return to clinic if no improvement or any  changes.    ED Prescriptions     Medication Sig Dispense Auth. Provider   amoxicillin  (AMOXIL ) 500 MG capsule Take 1 capsule (500 mg total) by mouth 2 (two) times daily for 10 days. 20 capsule Dreama, Kaylena Pacifico  N, FNP      PDMP not reviewed this encounter.   Dreama Gayland SAILOR, [place] 05/03/24 [phone]

## 2024-05-03 NOTE — ED Triage Notes (Signed)
 Chief Complaint: sore throat and runny nose. States woke up this morning with white spots in the back of the throat.   Sick exposure: No  Onset: this past Wednesday   Prescriptions or OTC medications tried: Yes- Nyquil, alka-seltzer, warm lemon tea, Halls cough drops    with little relief  New foods, medications, or products: No  Recent Travel: No

## 2024-05-06 LAB — CULTURE, GROUP A STREP (THRC)

## 2024-05-07 ENCOUNTER — Ambulatory Visit (HOSPITAL_COMMUNITY): Payer: Self-pay

## 2024-05-10 ENCOUNTER — Ambulatory Visit: Admitting: Family Medicine

## 2024-06-20 ENCOUNTER — Encounter: Payer: Self-pay | Admitting: Adult Health

## 2024-06-20 DIAGNOSIS — E782 Mixed hyperlipidemia: Secondary | ICD-10-CM

## 2024-06-21 MED ORDER — SIMVASTATIN 10 MG PO TABS: 10.0000 mg | ORAL_TABLET | Freq: Every day | ORAL | 0 refills | Status: DC

## 2024-08-13 ENCOUNTER — Ambulatory Visit: Admitting: Adult Health

## 2024-08-13 VITALS — BP 120/80 | HR 75 | Temp 98.1°F | Ht 66.0 in | Wt 209.0 lb

## 2024-08-13 DIAGNOSIS — L309 Dermatitis, unspecified: Secondary | ICD-10-CM | POA: Diagnosis not present

## 2024-08-13 MED ORDER — TRIAMCINOLONE ACETONIDE 0.1 % EX CREA: 1.0000 | TOPICAL_CREAM | Freq: Two times a day (BID) | CUTANEOUS | 0 refills | Status: AC

## 2024-08-13 NOTE — Progress Notes (Signed)
 Subjective:    Patient ID: Audrey Stafford, female    DOB: 1981-06-24, 43 y.o.   MRN: [id number]  HPI 43 year old female who  has a past medical history of Anemia, Arthritis, Back pain, Chest pain, Constipation, Dysmenorrhea, Family history of breast cancer, Family history of prostate cancer, Family history of throat cancer, GERD (gastroesophageal reflux disease), Hypertension, Joint pain, Migraine without aura, Multiple food allergies, and Palpitations.  She presents to the office today for an acute visit.  She reports that last month she has been experiencing rash that started in her right [facility] area and then spread to her left AC area. Rash is itchy and scratching makes the rash itch worse. She has not changed detergents, soaps, or lotions. She has not been applying anything to the rash. She has not noticed any redness or warmth   Review of Systems See HPI   Past Medical History:  Diagnosis Date   Anemia    Arthritis    Knees   Back pain    Chest pain    Constipation    Dysmenorrhea    Family history of breast cancer    Family history of prostate cancer    Family history of throat cancer    GERD (gastroesophageal reflux disease)    Hypertension    Joint pain    Migraine without aura    Multiple food allergies    Milk   Palpitations     Social History   Socioeconomic History   Marital status: Significant Other    Spouse name: Franky Lee   Number of children: 1   Years of education: Not on file   Highest education level: Bachelor's degree (e.g., BA, AB, BS)  Occupational History   Occupation: Nurse, children's   Tobacco Use   Smoking status: Never   Smokeless tobacco: Never  Vaping Use   Vaping status: Never Used  Substance and Sexual Activity   Alcohol use: No   Drug use: No   Sexual activity: Yes    Partners: Male    Birth control/protection: I.U.D.    Comment: Mirena  IUD  Other Topics Concern   Not on file  Social History Narrative    She works at [employer].    Not married    1 child ( boy)   Social Drivers of Corporate investment banker Strain: Low Risk  (08/13/2024)   Overall Financial Resource Strain (CARDIA)    Difficulty of Paying Living Expenses: Not hard at all  Food Insecurity: No Food Insecurity (08/13/2024)   Hunger Vital Sign    Worried About Running Out of Food in the Last Year: Never true    Ran Out of Food in the Last Year: Never true  Transportation Needs: No Transportation Needs (08/13/2024)   PRAPARE - Administrator, Civil Service (Medical): No    Lack of Transportation (Non-Medical): No  Physical Activity: Insufficiently Active (08/13/2024)   Exercise Vital Sign    Days of Exercise per Week: 3 days    Minutes of Exercise per Session: 30 min  Stress: No Stress Concern Present (08/13/2024)   [employer] of Occupational Health - Occupational Stress Questionnaire    Feeling of Stress: Not at all  Social Connections: Moderately Integrated (08/13/2024)   Social Connection and Isolation Panel    Frequency of Communication with Friends and Family: More than three times a week    Frequency of Social Gatherings with Friends and Family: Twice a week  Attends Religious Services: 1 to 4 times per year    Active Member of Clubs or Organizations: No    Attends Banker Meetings: Not on file    Marital Status: Living with partner  Intimate Partner Violence: Unknown (01/25/2022)   Received from [facility]   HITS    Physically Hurt: Not on file    Insult or Talk Down To: Not on file    Threaten Physical Harm: Not on file    Scream or Curse: Not on file    Past Surgical History:  Procedure Laterality Date   CESAREAN SECTION  01/11/2005    Family History  Problem Relation Age of Onset   Sudden Cardiac Death Mother        MI   Breast cancer Paternal Aunt        dx >50, unconfirmed diagnosis   Hypertension Maternal Grandmother    Diabetes Maternal Grandmother     Hypertension Maternal Aunt    Prostate cancer Maternal Uncle        dx 60s   Throat cancer Paternal Aunt        d. >50, smoker   Breast cancer Paternal Great-grandmother        PGF's mother    Allergies  Allergen Reactions   Macrobid [Nitrofurantoin] Other (See Comments)    Throat irritation    Milk-Related Compounds Swelling    Current Outpatient Medications on File Prior to Visit  Medication Sig Dispense Refill   levonorgestrel  (MIRENA ) 20 MCG/DAY IUD 1 each by Intrauterine route once.     levothyroxine  (SYNTHROID ) 75 MCG tablet TAKE 1 TABLET(75 MCG) BY MOUTH DAILY 90 tablet 3   pantoprazole  (PROTONIX ) 40 MG tablet Take 1 tablet (40 mg total) by mouth daily. 90 tablet 0   simvastatin  (ZOCOR ) 10 MG tablet Take 1 tablet (10 mg total) by mouth daily. 90 tablet 0   phentermine  30 MG capsule Take 1 capsule (30 mg total) by mouth every morning. (Patient not taking: Reported on 08/13/2024) 30 capsule 2   verapamil  (CALAN -SR) 120 MG CR tablet Take 1 tablet (120 mg total) by mouth daily. (Patient not taking: Reported on 08/13/2024) 90 tablet 3   Current Facility-Administered Medications on File Prior to Visit  Medication Dose Route Frequency Provider Last Rate Last Admin   lidocaine  (XYLOCAINE ) 2 % jelly 1 Application  1 Application Topical Once         BP 120/80   Pulse 75   Temp 98.1 F (36.7 C) (Oral)   Ht 5' 6 (1.676 m)   Wt 209 lb (94.8 kg)   SpO2 99%   BMI 33.73 kg/m       Objective:   Physical Exam Vitals and nursing note reviewed.  Constitutional:      Appearance: Normal appearance.  Skin:    General: Skin is dry.     Findings: No erythema.     Comments: She does have scratch marks on both upper arms but no definitive rash noted.   Neurological:     General: No focal deficit present.     Mental Status: She is alert and oriented to person, place, and time.  Psychiatric:        Mood and Affect: Mood normal.        Behavior: Behavior normal.        Thought  Content: Thought content normal.        Judgment: Judgment normal.       Assessment & Plan:  1. Dermatitis (Primary) - Will have her start taking OTC claritin or zyrtec daily and will send in Kenalog to use topically as needed. Do not use steroid cream for longer than 2 weeks  - Follow up at upcoming CPE - triamcinolone cream (KENALOG) 0.1 %; Apply 1 Application topically 2 (two) times daily.  Dispense: 45 g; Refill: 0  Darleene Shape, NP

## 2024-11-08 ENCOUNTER — Other Ambulatory Visit: Payer: Self-pay | Admitting: Adult Health

## 2024-11-08 ENCOUNTER — Encounter: Payer: Self-pay | Admitting: Adult Health

## 2024-11-08 ENCOUNTER — Ambulatory Visit (INDEPENDENT_AMBULATORY_CARE_PROVIDER_SITE_OTHER): Admitting: Adult Health

## 2024-11-08 ENCOUNTER — Ambulatory Visit: Payer: Self-pay | Admitting: Adult Health

## 2024-11-08 VITALS — BP 160/90 | HR 86 | Temp 98.4°F | Ht 66.0 in | Wt 208.0 lb

## 2024-11-08 DIAGNOSIS — E66811 Obesity, class 1: Secondary | ICD-10-CM | POA: Diagnosis not present

## 2024-11-08 DIAGNOSIS — E782 Mixed hyperlipidemia: Secondary | ICD-10-CM | POA: Diagnosis not present

## 2024-11-08 DIAGNOSIS — Z0000 Encounter for general adult medical examination without abnormal findings: Secondary | ICD-10-CM | POA: Diagnosis not present

## 2024-11-08 DIAGNOSIS — E559 Vitamin D deficiency, unspecified: Secondary | ICD-10-CM | POA: Diagnosis not present

## 2024-11-08 DIAGNOSIS — E039 Hypothyroidism, unspecified: Secondary | ICD-10-CM

## 2024-11-08 DIAGNOSIS — I493 Ventricular premature depolarization: Secondary | ICD-10-CM | POA: Diagnosis not present

## 2024-11-08 LAB — COMPREHENSIVE METABOLIC PANEL WITH GFR
ALT: 12 U/L (ref 3–35)
AST: 17 U/L (ref 5–37)
Albumin: 4.4 g/dL (ref 3.5–5.2)
Alkaline Phosphatase: 69 U/L (ref 39–117)
BUN: 10 mg/dL (ref 6–23)
CO2: 27 meq/L (ref 19–32)
Calcium: 9.1 mg/dL (ref 8.4–10.5)
Chloride: 105 meq/L (ref 96–112)
Creatinine, Ser: 0.7 mg/dL (ref 0.40–1.20)
GFR: 106.94 mL/min
Glucose, Bld: 85 mg/dL (ref 70–99)
Potassium: 3.7 meq/L (ref 3.5–5.1)
Sodium: 139 meq/L (ref 135–145)
Total Bilirubin: 0.7 mg/dL (ref 0.2–1.2)
Total Protein: 7.6 g/dL (ref 6.0–8.3)

## 2024-11-08 LAB — VITAMIN D 25 HYDROXY (VIT D DEFICIENCY, FRACTURES): VITD: 14.36 ng/mL — ABNORMAL LOW (ref 30.00–100.00)

## 2024-11-08 LAB — CBC WITH DIFFERENTIAL/PLATELET
Basophils Absolute: 0 K/uL (ref 0.0–0.1)
Basophils Relative: 1.1 % (ref 0.0–3.0)
Eosinophils Absolute: 0.1 K/uL (ref 0.0–0.7)
Eosinophils Relative: 2.7 % (ref 0.0–5.0)
HCT: 38.8 % (ref 36.0–46.0)
Hemoglobin: 13 g/dL (ref 12.0–15.0)
Lymphocytes Relative: 44.3 % (ref 12.0–46.0)
Lymphs Abs: 1.8 K/uL (ref 0.7–4.0)
MCHC: 33.5 g/dL (ref 30.0–36.0)
MCV: 88.5 fl (ref 78.0–100.0)
Monocytes Absolute: 0.4 K/uL (ref 0.1–1.0)
Monocytes Relative: 9.5 % (ref 3.0–12.0)
Neutro Abs: 1.7 K/uL (ref 1.4–7.7)
Neutrophils Relative %: 42.4 % — ABNORMAL LOW (ref 43.0–77.0)
Platelets: 282 K/uL (ref 150.0–400.0)
RBC: 4.38 Mil/uL (ref 3.87–5.11)
RDW: 13.9 % (ref 11.5–15.5)
WBC: 4.1 K/uL (ref 4.0–10.5)

## 2024-11-08 LAB — LIPID PANEL
Cholesterol: 164 mg/dL (ref 28–200)
HDL: 36.3 mg/dL — ABNORMAL LOW
LDL Cholesterol: 118 mg/dL — ABNORMAL HIGH (ref 10–99)
NonHDL: 127.64
Total CHOL/HDL Ratio: 5
Triglycerides: 50 mg/dL (ref 10.0–149.0)
VLDL: 10 mg/dL (ref 0.0–40.0)

## 2024-11-08 LAB — TSH: TSH: 1.37 u[IU]/mL (ref 0.35–5.50)

## 2024-11-08 MED ORDER — VERAPAMIL HCL ER 120 MG PO CP24: 120.0000 mg | ORAL_CAPSULE | Freq: Every day | ORAL | 3 refills | Status: AC

## 2024-11-08 MED ORDER — SIMVASTATIN 10 MG PO TABS: 10.0000 mg | ORAL_TABLET | Freq: Every day | ORAL | 3 refills | Status: AC

## 2024-11-08 MED ORDER — VITAMIN D (ERGOCALCIFEROL) 1.25 MG (50000 UNIT) PO CAPS: 50000.0000 [IU] | ORAL_CAPSULE | ORAL | 1 refills | Status: AC

## 2024-11-08 NOTE — Progress Notes (Signed)
 "  Subjective:    Patient ID: Audrey Stafford, female    DOB: 1981/07/11, 44 y.o.   MRN: [id number]  HPI Patient presents for yearly preventative medicine examination. She is a pleasant 44 year old female who  has a past medical history of Anemia, Arthritis, Back pain, Chest pain, Constipation, Dysmenorrhea, Family history of breast cancer, Family history of prostate cancer, Family history of throat cancer, GERD (gastroesophageal reflux disease), Hypertension, Joint pain, Migraine without aura, Multiple food allergies, and Palpitations.  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  Is followed by endocrinology Lab Results  Component Value Date   TSH 0.89 01/26/2024   PVCs-seen by cardiology.  Prescribed Verapamil . She has been out of her medication for the last month. She has not noticed any PVCs.   Hyperlipidemia-  Prescribed Simvastatin  10 mg daily. She denies myalgia or fatigue.  Lab Results  Component Value Date   CHOL 141 07/06/2023   HDL 36 (L) 07/06/2023   LDLCALC 92 07/06/2023   TRIG 62 07/06/2023   CHOLHDL 3.9 07/06/2023   Obesity - - no longer taking phentermine . She is trying to walk on her walking pad and eat healthy.  Wt Readings from Last 3 Encounters:  11/08/24 208 lb (94.3 kg)  08/13/24 209 lb (94.8 kg)  05/03/24 206 lb (93.4 kg)   Vitamin D  Deficiency - she is not currently taking Vitamin D  levels.   BP Readings from Last 3 Encounters:  11/08/24 (!) 160/90  08/13/24 120/80  05/03/24 (!) 151/100   Hypertension - she reports that at home she has checked periodically with readings in the 130's-160's systolic. She occasionally has headaches but no blurred vision. She has been out of Verapamil  that she takes for PVCs for over the last month.    All immunizations and health maintenance protocols were reviewed with the patient and needed orders were placed.  Appropriate screening laboratory values were ordered for the patient including screening of  hyperlipidemia, renal function and hepatic function.  Medication reconciliation,  past medical history, social history, problem list and allergies were reviewed in detail with the patient  Goals were established with regard to weight loss, exercise, and  diet in compliance with medications  She is  up to date on GYN care and routine screening mammograms     Review of Systems  Constitutional: Negative.   HENT: Negative.    Eyes: Negative.   Respiratory: Negative.    Cardiovascular: Negative.   Gastrointestinal: Negative.   Endocrine: Negative.   Genitourinary: Negative.   Musculoskeletal: Negative.   Skin: Negative.   Allergic/Immunologic: Negative.   Neurological: Negative.   Hematological: Negative.   Psychiatric/Behavioral: Negative.     Past Medical History:  Diagnosis Date   Anemia    Arthritis    Knees   Back pain    Chest pain    Constipation    Dysmenorrhea    Family history of breast cancer    Family history of prostate cancer    Family history of throat cancer    GERD (gastroesophageal reflux disease)    Hypertension    Joint pain    Migraine without aura    Multiple food allergies    Milk   Palpitations     Social History   Socioeconomic History   Marital status: Significant Other    Spouse name: Franky Lee   Number of children: 1   Years of education: Not on file   Highest education level: Bachelor's degree (  e.g., BA, AB, BS)  Occupational History   Occupation: Nurse, [employer]   Tobacco Use   Smoking status: Never   Smokeless tobacco: Never  Vaping Use   Vaping status: Never Used  Substance and Sexual Activity   Alcohol use: No   Drug use: No   Sexual activity: Yes    Partners: Male    Birth control/protection: I.U.D.    Comment: Mirena  IUD  Other Topics Concern   Not on file  Social History Narrative   She works at [employer].    Not married    1 child ( boy)   Social Drivers of Health   Tobacco Use: Low Risk  (11/08/2024)   Patient History    Smoking Tobacco Use: Never    Smokeless Tobacco Use: Never    Passive Exposure: Not on file  Financial Resource Strain: Low Risk (08/13/2024)   Overall Financial Resource Strain (CARDIA)    Difficulty of Paying Living Expenses: Not hard at all  Food Insecurity: No Food Insecurity (08/13/2024)   Epic    Worried About Programme Researcher, Broadcasting/film/video in the Last Year: Never true    Ran Out of Food in the Last Year: Never true  Transportation Needs: No Transportation Needs (08/13/2024)   Epic    Lack of Transportation (Medical): No    Lack of Transportation (Non-Medical): No  Physical Activity: Insufficiently Active (08/13/2024)   Exercise Vital Sign    Days of Exercise per Week: 3 days    Minutes of Exercise per Session: 30 min  Stress: No Stress Concern Present (08/13/2024)   [employer] of Occupational Health - Occupational Stress Questionnaire    Feeling of Stress: Not at all  Social Connections: Moderately Integrated (08/13/2024)   Social Connection and Isolation Panel    Frequency of Communication with Friends and Family: More than three times a week    Frequency of Social Gatherings with Friends and Family: Twice a week    Attends Religious Services: 1 to 4 times per year    Active Member of [employer] or Organizations: No    Attends Engineer, Structural: Not on file    Marital Status: Living with partner  Intimate Partner Violence: Unknown (01/25/2022)   Received from [facility]   HITS    Physically Hurt: Not on file    Insult or Talk Down To: Not on file    Threaten Physical Harm: Not on file    Scream or Curse: Not on file  Depression (PHQ2-9): Low Risk (02/19/2024)   Depression (PHQ2-9)    PHQ-2 Score: 0  Alcohol Screen: Low Risk (08/13/2024)   Alcohol Screen    Last Alcohol Screening Score (AUDIT): 1  Housing: Low Risk (08/13/2024)   Epic    Unable to Pay for Housing in the Last Year: No    Number of Times Moved in the Last Year: 0     Homeless in the Last Year: No  Utilities: Not on file  Health Literacy: Not on file    Past Surgical History:  Procedure Laterality Date   CESAREAN SECTION  01/11/2005    Family History  Problem Relation Age of Onset   Sudden Cardiac Death Mother        MI   Breast cancer Paternal Aunt        dx >50, unconfirmed diagnosis   Hypertension Maternal Grandmother    Diabetes Maternal Grandmother    Hypertension Maternal Aunt    Prostate cancer Maternal Uncle  dx 60s   Throat cancer Paternal Aunt        d. >50, smoker   Breast cancer Paternal Great-grandmother        PGF's mother    Allergies[1]  Medications Ordered Prior to Encounter[2]  BP (!) 160/90   Pulse 86   Temp 98.4 F (36.9 C) (Oral)   Ht 5' 6 (1.676 m)   Wt 208 lb (94.3 kg)   LMP 11/02/2024   SpO2 97%   BMI 33.57 kg/m       Objective:   Physical Exam Vitals and nursing note reviewed.  Constitutional:      General: She is not in acute distress.    Appearance: Normal appearance. She is not ill-appearing.  HENT:     Head: Normocephalic and atraumatic.     Right Ear: Tympanic membrane, ear canal and external ear normal. There is no impacted cerumen.     Left Ear: Tympanic membrane, ear canal and external ear normal. There is no impacted cerumen.     Nose: Nose normal. No congestion or rhinorrhea.     Mouth/Throat:     Mouth: Mucous membranes are moist.     Pharynx: Oropharynx is clear.  Eyes:     Extraocular Movements: Extraocular movements intact.     Conjunctiva/sclera: Conjunctivae normal.     Pupils: Pupils are equal, round, and reactive to light.  Neck:     Vascular: No carotid bruit.  Cardiovascular:     Rate and Rhythm: Normal rate and regular rhythm.     Pulses: Normal pulses.     Heart sounds: No murmur heard.    No friction rub. No gallop.  Pulmonary:     Effort: Pulmonary effort is normal.     Breath sounds: Normal breath sounds.  Abdominal:     General: Abdomen is flat.  Bowel sounds are normal. There is no distension.     Palpations: Abdomen is soft. There is no mass.     Tenderness: There is no abdominal tenderness. There is no guarding or rebound.     Hernia: No hernia is present.  Musculoskeletal:        General: Normal range of motion.     Cervical back: Normal range of motion and neck supple.  Lymphadenopathy:     Cervical: No cervical adenopathy.  Skin:    General: Skin is warm and dry.     Capillary Refill: Capillary refill takes less than 2 seconds.  Neurological:     General: No focal deficit present.     Mental Status: She is alert and oriented to person, place, and time.  Psychiatric:        Mood and Affect: Mood normal.        Behavior: Behavior normal.        Thought Content: Thought content normal.        Judgment: Judgment normal.       Assessment & Plan:  1. Routine general medical examination at a health care facility (Primary) Today patient counseled on age appropriate routine health concerns for screening and prevention, each reviewed and up to date or declined. Immunizations reviewed and up to date or declined. Labs ordered and reviewed. Risk factors for depression reviewed and negative. Hearing function and visual acuity are intact. ADLs screened and addressed as needed. Functional ability and level of safety reviewed and appropriate. Education, counseling and referrals performed based on assessed risks today. Patient provided with a copy of personalized plan for preventive services.  2. Hypothyroidism, unspecified type - Follow up with Endocrinology as directed  - CBC with Differential/Platelet; Future - Comprehensive metabolic panel with GFR; Future - Lipid panel; Future - TSH; Future  3. PVC (premature ventricular contraction) - Will send in Verapamil  for her  - CBC with Differential/Platelet; Future - Comprehensive metabolic panel with GFR; Future - Lipid panel; Future - TSH; Future - verapamil  (VERELAN ) 120 MG 24  hr capsule; Take 1 capsule (120 mg total) by mouth at bedtime.  Dispense: 90 capsule; Refill: 3  4. Mixed hyperlipidemia - Continue with simvastatin   - CBC with Differential/Platelet; Future - Comprehensive metabolic panel with GFR; Future - Lipid panel; Future - TSH; Future - simvastatin  (ZOCOR ) 10 MG tablet; Take 1 tablet (10 mg total) by mouth daily.  Dispense: 90 tablet; Refill: 3  5. Class 1 obesity - Continue with lifestyle modifications  - CBC with Differential/Platelet; Future - Comprehensive metabolic panel with GFR; Future - Lipid panel; Future - TSH; Future  6. Vitamin D  deficiency  - VITAMIN D  25 Hydroxy (Vit-D Deficiency, Fractures); Future  Audrey Shape, NP     [1]  Allergies Allergen Reactions   Macrobid [Nitrofurantoin] Other (See Comments)    Throat irritation    Milk-Related Compounds Swelling  [2]  Current Outpatient Medications on File Prior to Visit  Medication Sig Dispense Refill   levonorgestrel  (MIRENA ) 20 MCG/DAY IUD 1 each by Intrauterine route once.     levothyroxine  (SYNTHROID ) 75 MCG tablet TAKE 1 TABLET(75 MCG) BY MOUTH DAILY 90 tablet 3   simvastatin  (ZOCOR ) 10 MG tablet Take 1 tablet (10 mg total) by mouth daily. 90 tablet 0   triamcinolone  cream (KENALOG ) 0.1 % Apply 1 Application topically 2 (two) times daily. 45 g 0   pantoprazole  (PROTONIX ) 40 MG tablet Take 1 tablet (40 mg total) by mouth daily. (Patient not taking: Reported on 11/08/2024) 90 tablet 0   phentermine  30 MG capsule Take 1 capsule (30 mg total) by mouth every morning. (Patient not taking: Reported on 11/08/2024) 30 capsule 2   verapamil  (CALAN -SR) 120 MG CR tablet Take 1 tablet (120 mg total) by mouth daily. (Patient not taking: Reported on 11/08/2024) 90 tablet 3   Current Facility-Administered Medications on File Prior to Visit  Medication Dose Route Frequency Provider Last Rate Last Admin   lidocaine  (XYLOCAINE ) 2 % jelly 1 Application  1 Application Topical Once        "

## 2025-01-27 ENCOUNTER — Ambulatory Visit: Admitting: Internal Medicine
